# Patient Record
Sex: Female | Born: 1955
Health system: Southern US, Community
[De-identification: ages and names within clinical notes are randomized; demographics above are authoritative.]

## PROBLEM LIST (undated history)

## (undated) DIAGNOSIS — R0600 Dyspnea, unspecified: Secondary | ICD-10-CM

## (undated) DIAGNOSIS — F1910 Other psychoactive substance abuse, uncomplicated: Secondary | ICD-10-CM

## (undated) DIAGNOSIS — F329 Major depressive disorder, single episode, unspecified: Secondary | ICD-10-CM

## (undated) DIAGNOSIS — D219 Benign neoplasm of connective and other soft tissue, unspecified: Secondary | ICD-10-CM

## (undated) DIAGNOSIS — M1990 Unspecified osteoarthritis, unspecified site: Secondary | ICD-10-CM

## (undated) DIAGNOSIS — Z5189 Encounter for other specified aftercare: Secondary | ICD-10-CM

## (undated) DIAGNOSIS — J449 Chronic obstructive pulmonary disease, unspecified: Secondary | ICD-10-CM

## (undated) DIAGNOSIS — D509 Iron deficiency anemia, unspecified: Secondary | ICD-10-CM

## (undated) DIAGNOSIS — F32A Depression, unspecified: Secondary | ICD-10-CM

## (undated) DIAGNOSIS — F419 Anxiety disorder, unspecified: Secondary | ICD-10-CM

## (undated) DIAGNOSIS — D649 Anemia, unspecified: Secondary | ICD-10-CM

## (undated) DIAGNOSIS — R0602 Shortness of breath: Secondary | ICD-10-CM

## (undated) DIAGNOSIS — I10 Essential (primary) hypertension: Secondary | ICD-10-CM

## (undated) DIAGNOSIS — E785 Hyperlipidemia, unspecified: Secondary | ICD-10-CM

## (undated) DIAGNOSIS — M5430 Sciatica, unspecified side: Secondary | ICD-10-CM

## (undated) DIAGNOSIS — E739 Lactose intolerance, unspecified: Secondary | ICD-10-CM

## (undated) DIAGNOSIS — M8580 Other specified disorders of bone density and structure, unspecified site: Secondary | ICD-10-CM

## (undated) HISTORY — PX: BRAIN SURGERY: SHX531

## (undated) HISTORY — DX: Encounter for other specified aftercare: Z51.89

## (undated) HISTORY — DX: Hyperlipidemia, unspecified: E78.5

## (undated) HISTORY — DX: Chronic obstructive pulmonary disease, unspecified: J44.9

## (undated) HISTORY — DX: Shortness of breath: R06.02

## (undated) HISTORY — DX: Sciatica, unspecified side: M54.30

## (undated) HISTORY — DX: Other psychoactive substance abuse, uncomplicated: F19.10

## (undated) HISTORY — DX: Anemia, unspecified: D64.9

## (undated) HISTORY — DX: Dyspnea, unspecified: R06.00

## (undated) HISTORY — DX: Iron deficiency anemia, unspecified: D50.9

## (undated) HISTORY — DX: Unspecified osteoarthritis, unspecified site: M19.90

## (undated) HISTORY — DX: Anxiety disorder, unspecified: F41.9

## (undated) HISTORY — DX: Major depressive disorder, single episode, unspecified: F32.9

## (undated) HISTORY — DX: Lactose intolerance, unspecified: E73.9

## (undated) HISTORY — DX: Benign neoplasm of connective and other soft tissue, unspecified: D21.9

## (undated) HISTORY — DX: Depression, unspecified: F32.A

## (undated) HISTORY — DX: Other specified disorders of bone density and structure, unspecified site: M85.80

---

## 1993-04-22 HISTORY — PX: BREAST SURGERY: SHX581

## 1993-04-22 HISTORY — PX: ABDOMINAL HYSTERECTOMY: SHX81

## 1998-02-05 ENCOUNTER — Encounter: Payer: Self-pay | Admitting: Emergency Medicine

## 1998-02-05 ENCOUNTER — Emergency Department (HOSPITAL_COMMUNITY): Admission: EM | Admit: 1998-02-05 | Discharge: 1998-02-05 | Payer: Self-pay | Admitting: Emergency Medicine

## 1998-07-13 ENCOUNTER — Emergency Department (HOSPITAL_COMMUNITY): Admission: EM | Admit: 1998-07-13 | Discharge: 1998-07-13 | Payer: Self-pay | Admitting: Emergency Medicine

## 1998-09-16 ENCOUNTER — Emergency Department (HOSPITAL_COMMUNITY): Admission: EM | Admit: 1998-09-16 | Discharge: 1998-09-16 | Payer: Self-pay | Admitting: Emergency Medicine

## 1999-05-12 ENCOUNTER — Emergency Department (HOSPITAL_COMMUNITY): Admission: EM | Admit: 1999-05-12 | Discharge: 1999-05-12 | Payer: Self-pay | Admitting: Emergency Medicine

## 2012-01-20 ENCOUNTER — Encounter (HOSPITAL_COMMUNITY): Payer: Self-pay | Admitting: Emergency Medicine

## 2012-01-20 ENCOUNTER — Emergency Department (HOSPITAL_COMMUNITY): Payer: No Typology Code available for payment source

## 2012-01-20 ENCOUNTER — Emergency Department (HOSPITAL_COMMUNITY)
Admission: EM | Admit: 2012-01-20 | Discharge: 2012-01-20 | Disposition: A | Discharge status: Home / Self Care | Payer: No Typology Code available for payment source | Attending: Emergency Medicine | Admitting: Emergency Medicine

## 2012-01-20 DIAGNOSIS — S335XXA Sprain of ligaments of lumbar spine, initial encounter: Secondary | ICD-10-CM | POA: Insufficient documentation

## 2012-01-20 DIAGNOSIS — F17200 Nicotine dependence, unspecified, uncomplicated: Secondary | ICD-10-CM | POA: Insufficient documentation

## 2012-01-20 DIAGNOSIS — IMO0002 Reserved for concepts with insufficient information to code with codable children: Secondary | ICD-10-CM | POA: Insufficient documentation

## 2012-01-20 DIAGNOSIS — E669 Obesity, unspecified: Secondary | ICD-10-CM | POA: Insufficient documentation

## 2012-01-20 DIAGNOSIS — S139XXA Sprain of joints and ligaments of unspecified parts of neck, initial encounter: Secondary | ICD-10-CM | POA: Insufficient documentation

## 2012-01-20 DIAGNOSIS — Y92410 Unspecified street and highway as the place of occurrence of the external cause: Secondary | ICD-10-CM | POA: Insufficient documentation

## 2012-01-20 DIAGNOSIS — S2000XA Contusion of breast, unspecified breast, initial encounter: Secondary | ICD-10-CM | POA: Insufficient documentation

## 2012-01-20 DIAGNOSIS — Z23 Encounter for immunization: Secondary | ICD-10-CM | POA: Insufficient documentation

## 2012-01-20 MED ORDER — TETANUS-DIPHTH-ACELL PERTUSSIS 5-2.5-18.5 LF-MCG/0.5 IM SUSP
INTRAMUSCULAR | Status: AC
  Administered 2012-01-20: 0.5 mL via INTRAMUSCULAR
  Filled 2012-01-20: qty 0.5

## 2012-01-20 MED ORDER — BACITRACIN 500 UNIT/GM EX OINT
1.0000 "application " | TOPICAL_OINTMENT | Freq: Two times a day (BID) | CUTANEOUS | Status: DC
  Administered 2012-01-20: 1 via TOPICAL
  Filled 2012-01-20: qty 0.9

## 2012-01-20 MED ORDER — HYDROCODONE-ACETAMINOPHEN 5-325 MG PO TABS
2.0000 | ORAL_TABLET | Freq: Once | ORAL | Status: AC
  Administered 2012-01-20: 2 via ORAL

## 2012-01-20 MED ORDER — IBUPROFEN 800 MG PO TABS
800.0000 mg | ORAL_TABLET | Freq: Once | ORAL | Status: AC
  Administered 2012-01-20: 800 mg via ORAL
  Filled 2012-01-20: qty 1

## 2012-01-20 MED ORDER — HYDROCODONE-ACETAMINOPHEN 5-325 MG PO TABS: 2.0000 | ORAL_TABLET | ORAL | Status: DC | PRN

## 2012-01-20 MED ORDER — HYDROCODONE-ACETAMINOPHEN 5-325 MG PO TABS
ORAL_TABLET | ORAL | Status: AC
  Filled 2012-01-20: qty 2
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
## 2012-01-20 NOTE — ED Notes (Signed)
Left hand wounds dressed with bacitracin, telfa gauze and Kerlix wrap.
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
## 2012-01-23 ENCOUNTER — Encounter (HOSPITAL_COMMUNITY): Payer: Self-pay | Admitting: Emergency Medicine

## 2012-01-23 ENCOUNTER — Emergency Department (INDEPENDENT_AMBULATORY_CARE_PROVIDER_SITE_OTHER)
Admission: EM | Admit: 2012-01-23 | Discharge: 2012-01-23 | Disposition: A | Discharge status: Home / Self Care | Payer: Self-pay | Source: Home / Self Care | Attending: Emergency Medicine | Admitting: Emergency Medicine

## 2012-01-23 ENCOUNTER — Emergency Department (INDEPENDENT_AMBULATORY_CARE_PROVIDER_SITE_OTHER): Payer: Self-pay

## 2012-01-23 DIAGNOSIS — S2000XA Contusion of breast, unspecified breast, initial encounter: Secondary | ICD-10-CM

## 2012-01-23 DIAGNOSIS — S299XXA Unspecified injury of thorax, initial encounter: Secondary | ICD-10-CM

## 2012-01-23 DIAGNOSIS — S298XXA Other specified injuries of thorax, initial encounter: Secondary | ICD-10-CM

## 2012-01-23 LAB — POCT URINALYSIS DIP (DEVICE)
Ketones, ur: NEGATIVE mg/dL
Leukocytes, UA: NEGATIVE
Protein, ur: NEGATIVE mg/dL
Urobilinogen, UA: 0.2 mg/dL (ref 0.0–1.0)

## 2012-01-23 MED ORDER — OXYCODONE-ACETAMINOPHEN 5-325 MG PO TABS: ORAL_TABLET | ORAL | Status: DC
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
## 2012-02-19 ENCOUNTER — Emergency Department (HOSPITAL_COMMUNITY)
Admission: EM | Admit: 2012-02-19 | Discharge: 2012-02-19 | Disposition: A | Discharge status: Home / Self Care | Payer: Self-pay | Source: Home / Self Care

## 2012-02-19 NOTE — ED Notes (Signed)
Patient called but not present in waiting area x1

## 2012-02-19 NOTE — ED Notes (Signed)
No answer in lobby x 2.

## 2012-02-20 ENCOUNTER — Emergency Department (HOSPITAL_COMMUNITY)
Admission: EM | Admit: 2012-02-20 | Discharge: 2012-02-20 | Disposition: A | Discharge status: Home / Self Care | Payer: No Typology Code available for payment source | Attending: Emergency Medicine | Admitting: Emergency Medicine

## 2012-02-20 ENCOUNTER — Encounter (HOSPITAL_COMMUNITY): Payer: Self-pay

## 2012-02-20 DIAGNOSIS — I10 Essential (primary) hypertension: Secondary | ICD-10-CM | POA: Insufficient documentation

## 2012-02-20 DIAGNOSIS — R5383 Other fatigue: Secondary | ICD-10-CM | POA: Insufficient documentation

## 2012-02-20 DIAGNOSIS — R51 Headache: Secondary | ICD-10-CM

## 2012-02-20 DIAGNOSIS — F17200 Nicotine dependence, unspecified, uncomplicated: Secondary | ICD-10-CM | POA: Insufficient documentation

## 2012-02-20 DIAGNOSIS — R5381 Other malaise: Secondary | ICD-10-CM | POA: Insufficient documentation

## 2012-02-20 MED ORDER — PROMETHAZINE HCL 25 MG PO TABS: 25.0000 mg | ORAL_TABLET | Freq: Four times a day (QID) | ORAL | Status: DC | PRN

## 2012-02-20 MED ORDER — OXYCODONE-ACETAMINOPHEN 5-325 MG PO TABS: 2.0000 | ORAL_TABLET | ORAL | Status: DC | PRN

## 2012-02-20 MED ORDER — KETOROLAC TROMETHAMINE 30 MG/ML IJ SOLN
INTRAMUSCULAR | Status: AC
  Filled 2012-02-20: qty 2

## 2012-02-20 MED ORDER — KETOROLAC TROMETHAMINE 60 MG/2ML IM SOLN
60.0000 mg | Freq: Once | INTRAMUSCULAR | Status: AC
  Administered 2012-02-20: 60 mg via INTRAMUSCULAR

## 2012-02-20 NOTE — ED Provider Notes (Signed)
I saw and evaluated the patient, reviewed the resident's note and I agree with the findings and plan. Ha. No ams, neuro deficits or signs cns or systemic infxs.  sxs resolved in ed.  Cheri Guppy, MD 02/28/12 [phone]

## 2012-02-20 NOTE — ED Notes (Signed)
Patient presents with intermittent headache, dizziness, and hypertension x several weeks.  Patient denies chest pain and SOB.

## 2012-02-21 NOTE — ED Provider Notes (Signed)
History     CSN: [id number]  Arrival date & time 02/20/12  2049   First MD Initiated Contact with Patient 02/20/12 2125      Chief Complaint  Patient presents with  . Headache  . Hypertension    (Consider location/radiation/quality/duration/timing/severity/associated sxs/prior treatment) HPI Comments: Patient comes to the emergency department due to concerns of her headache. She has had it intermittently for 2 months, it has been worse for the past 2 weeks. It is a gradual onset headache that usually is frontal but at times also occipital. She has no other neurologic complaints. She is also concerned about her blood pressure. She has had it checked at her chiropractor several times and it has been up to 150s systolic at times. She does not have a diagnosis of hypertension. She is also under a lot of stress since moving in with her father to help take care of him 2 months ago.  Patient is a 56 y.o. female presenting with headaches and hypertension. The history is provided by the patient. No language interpreter was used.  Headache  This is a chronic problem. The current episode started more than 1 week ago. The problem occurs every few hours. The problem has not changed since onset.The headache is associated with nothing. The pain is located in the frontal and occipital region. The quality of the pain is described as dull. The pain is at a severity of 8/10. The pain is severe. The pain does not radiate. Associated symptoms include malaise/fatigue. Pertinent negatives include no anorexia, no fever, no chest pressure, no near-syncope, no orthopnea, no palpitations, no syncope, no shortness of breath, no nausea and no vomiting. She has tried oral narcotic analgesics for the symptoms. The treatment provided no relief.  Hypertension This is a new problem. The current episode started 1 to 4 weeks ago. The problem occurs every several days. Associated symptoms include headaches. Pertinent negatives  include no abdominal pain, anorexia, arthralgias, chest pain, chills, congestion, coughing, fever, nausea, neck pain, vomiting or weakness. Nothing aggravates the symptoms. She has tried nothing for the symptoms. The treatment provided no relief.    History reviewed. No pertinent past medical history.  Past Surgical History  Procedure Date  . Cesarean section   . Abdominal hysterectomy   . Brain surgery   . Breast surgery     No family history on file.  History  Substance Use Topics  . Smoking status: Current Every Day Smoker -- 0.5 packs/day    Types: Cigarettes  . Smokeless tobacco: Not on file  . Alcohol Use: No    OB History    Grav Para Term Preterm Abortions TAB SAB Ect Mult Living                  Review of Systems  Constitutional: Positive for malaise/fatigue. Negative for fever, chills, activity change and appetite change.  HENT: Negative for congestion, rhinorrhea, neck pain, neck stiffness and sinus pressure.   Eyes: Negative for discharge and visual disturbance.  Respiratory: Negative for cough, chest tightness, shortness of breath, wheezing and stridor.   Cardiovascular: Negative for chest pain, palpitations, orthopnea, leg swelling, syncope and near-syncope.  Gastrointestinal: Negative for nausea, vomiting, abdominal pain, diarrhea, abdominal distention and anorexia.  Genitourinary: Negative for decreased urine volume and difficulty urinating.  Musculoskeletal: Negative for back pain and arthralgias.  Skin: Negative for color change and pallor.  Neurological: Positive for headaches. Negative for weakness and light-headedness.  Psychiatric/Behavioral: Negative for behavioral problems and agitation.  All other systems reviewed and are negative.    Allergies  Review of patient's allergies indicates no known allergies.  Home Medications   Current Outpatient Rx  Name Route Sig Dispense Refill  . OXYCODONE-ACETAMINOPHEN 5-325 MG PO TABS Oral Take 1 tablet  by mouth every 4 (four) hours as needed. For pain    . OXYCODONE-ACETAMINOPHEN 5-325 MG PO TABS Oral Take 2 tablets by mouth every 4 (four) hours as needed for pain. 6 tablet 0  . PROMETHAZINE HCL 25 MG PO TABS Oral Take 1 tablet (25 mg total) by mouth every 6 (six) hours as needed for nausea. 12 tablet 0    BP 133/76  Pulse 92  Temp 97.8 F (36.6 C) (Oral)  Resp 18  SpO2 97%  Physical Exam  Nursing note and vitals reviewed. Constitutional: She is oriented to person, place, and time. She appears well-developed and well-nourished. No distress.  HENT:  Head: Normocephalic and atraumatic.  Mouth/Throat: No oropharyngeal exudate.  Eyes: EOM are normal. Pupils are equal, round, and reactive to light. Right eye exhibits no discharge. Left eye exhibits no discharge.  Neck: Normal range of motion. Neck supple. No JVD present.  Cardiovascular: Normal rate, regular rhythm and normal heart sounds.   Pulmonary/Chest: Effort normal and breath sounds normal. No stridor. No respiratory distress. She exhibits no tenderness.  Abdominal: Soft. Bowel sounds are normal. She exhibits no distension. There is no tenderness. There is no guarding.  Musculoskeletal: Normal range of motion. She exhibits no edema and no tenderness.  Neurological: She is alert and oriented to person, place, and time. She has normal reflexes. She displays normal reflexes. No cranial nerve deficit. She exhibits normal muscle tone. Coordination normal.       nml finger to nose. Neg romberg. nml gait.  Skin: Skin is warm and dry. No rash noted. She is not diaphoretic.  Psychiatric: She has a normal mood and affect. Her behavior is normal. Judgment and thought content normal.    ED Course  Procedures (including critical care time)  Labs Reviewed - No data to display No results found.   1. Headache       MDM  Clinical picture consistent with a likely tension headache. I believe stress and anxiety play a large part of this  due to her situation. She has no red flags for headache. It was not sudden onset, she has had no other neurological complaints such as vision change, paresthesias, weakness, vertigo. I consider but doubt subarachnoid hemorrhage, stroke, meningitis or encephalitis, increased intracranial pressure, dural sinus venous thrombosis. The patient was given Toradol which she responded well to. I am not concerned about her blood pressure. It has been mildly elevated on a few isolated occasions but is normal here. I instructed her on establishing primary care with a doctor for health maintenance and to manage her blood pressure if it does go up. I also counseled her on conservative measures to decrease blood pressure including diet modification, salt reduction, exercise. Pt deemed stable for discharge. Return precautions were provided and pt expressed understanding to return to ED if any acute symptoms return. Follow up was instructed which pt also expressed understanding. All questions were answered and pt was in agreement w/ plan.         Warrick Parisian, MD 02/21/12 0010

## 2012-02-21 NOTE — ED Provider Notes (Signed)
I saw and evaluated the patient, reviewed the resident's note and I agree with the findings and plan.  Karynn Deblasi, MD 02/21/12 0013 

## 2012-03-30 ENCOUNTER — Ambulatory Visit (HOSPITAL_COMMUNITY)
Admission: RE | Admit: 2012-03-30 | Discharge: 2012-03-30 | Disposition: A | Discharge status: Home / Self Care | Payer: No Typology Code available for payment source | Source: Ambulatory Visit | Attending: Chiropractic Medicine | Admitting: Chiropractic Medicine

## 2012-03-30 ENCOUNTER — Other Ambulatory Visit (HOSPITAL_COMMUNITY): Payer: Self-pay | Admitting: Chiropractic Medicine

## 2012-03-30 DIAGNOSIS — R52 Pain, unspecified: Secondary | ICD-10-CM

## 2012-03-30 DIAGNOSIS — M25519 Pain in unspecified shoulder: Secondary | ICD-10-CM | POA: Insufficient documentation

## 2012-03-30 DIAGNOSIS — R29898 Other symptoms and signs involving the musculoskeletal system: Secondary | ICD-10-CM | POA: Insufficient documentation

## 2012-03-30 DIAGNOSIS — M19019 Primary osteoarthritis, unspecified shoulder: Secondary | ICD-10-CM | POA: Insufficient documentation

## 2012-03-30 DIAGNOSIS — M719 Bursopathy, unspecified: Secondary | ICD-10-CM | POA: Insufficient documentation

## 2012-03-30 DIAGNOSIS — M67919 Unspecified disorder of synovium and tendon, unspecified shoulder: Secondary | ICD-10-CM | POA: Insufficient documentation

## 2012-04-23 ENCOUNTER — Encounter (HOSPITAL_COMMUNITY): Payer: Self-pay | Admitting: Emergency Medicine

## 2012-04-23 ENCOUNTER — Emergency Department (INDEPENDENT_AMBULATORY_CARE_PROVIDER_SITE_OTHER)
Admission: EM | Admit: 2012-04-23 | Discharge: 2012-04-23 | Disposition: A | Discharge status: Home / Self Care | Payer: Self-pay | Source: Home / Self Care | Attending: Family Medicine | Admitting: Family Medicine

## 2012-04-23 DIAGNOSIS — M545 Low back pain, unspecified: Secondary | ICD-10-CM

## 2012-04-23 DIAGNOSIS — R52 Pain, unspecified: Secondary | ICD-10-CM

## 2012-04-23 DIAGNOSIS — G8929 Other chronic pain: Secondary | ICD-10-CM

## 2012-04-23 HISTORY — DX: Essential (primary) hypertension: I10

## 2012-04-23 LAB — POCT URINALYSIS DIP (DEVICE)
Glucose, UA: NEGATIVE mg/dL
Nitrite: NEGATIVE
Urobilinogen, UA: 0.2 mg/dL (ref 0.0–1.0)

## 2012-04-23 MED ORDER — TRAMADOL HCL 50 MG PO TABS: 50.0000 mg | ORAL_TABLET | Freq: Four times a day (QID) | ORAL | Status: DC | PRN

## 2012-04-23 MED ORDER — NAPROXEN 500 MG PO TABS: 500.0000 mg | ORAL_TABLET | Freq: Two times a day (BID) | ORAL | Status: DC

## 2012-04-23 MED ORDER — CYCLOBENZAPRINE HCL 10 MG PO TABS: 10.0000 mg | ORAL_TABLET | Freq: Two times a day (BID) | ORAL | Status: DC | PRN

## 2012-04-23 MED ORDER — OXYCODONE-ACETAMINOPHEN 5-325 MG PO TABS: 2.0000 | ORAL_TABLET | Freq: Three times a day (TID) | ORAL | Status: DC | PRN

## 2012-04-23 NOTE — ED Provider Notes (Signed)
History     CSN: [id number]  Arrival date & time 04/23/12  1005   First MD Initiated Contact with Patient 04/23/12 1034      Chief Complaint  Patient presents with  . Back Pain    (Consider location/radiation/quality/duration/timing/severity/associated sxs/prior treatment) HPI Comments: 57 year old smoker female with history of degenerative disc disease and facet arthropathy of the lumbar spine here complaining of pain exacerbation of her chronic back pain. Reports pain is worse towards the right side in the low back with radiation to the right hip. Denies low extremity numbness, weakness or paresthesia. Denies urinary or stool incontinence. Denies abdominal pain, pelvic pain dysuria or hematuria. Denies fever or chills.   Past Medical History  Diagnosis Date  . Hypertension     Past Surgical History  Procedure Date  . Cesarean section   . Abdominal hysterectomy   . Brain surgery   . Breast surgery     No family history on file.  History  Substance Use Topics  . Smoking status: Current Every Day Smoker -- 0.5 packs/day    Types: Cigarettes  . Smokeless tobacco: Not on file  . Alcohol Use: No    OB History    Grav Para Term Preterm Abortions TAB SAB Ect Mult Living                  Review of Systems  Constitutional: Negative for fever and chills.  Gastrointestinal: Negative for nausea, vomiting, abdominal pain and diarrhea.  Genitourinary: Negative for dysuria, frequency, hematuria, flank pain and pelvic pain.  Musculoskeletal: Positive for back pain.  Skin: Negative for rash.  Neurological: Negative for dizziness, weakness, numbness and headaches.    Allergies  Review of patient's allergies indicates no known allergies.  Home Medications   Current Outpatient Rx  Name  Route  Sig  Dispense  Refill  . CYCLOBENZAPRINE HCL 10 MG PO TABS   Oral   Take 1 tablet (10 mg total) by mouth 2 (two) times daily as needed for muscle spasms.   20 tablet   0   .  NAPROXEN 500 MG PO TABS   Oral   Take 1 tablet (500 mg total) by mouth 2 (two) times daily with a meal.   20 tablet   0   . OXYCODONE-ACETAMINOPHEN 5-325 MG PO TABS   Oral   Take 2 tablets by mouth every 8 (eight) hours as needed for pain.   6 tablet   0   . PROMETHAZINE HCL 25 MG PO TABS   Oral   Take 1 tablet (25 mg total) by mouth every 6 (six) hours as needed for nausea.   12 tablet   0   . TRAMADOL HCL 50 MG PO TABS   Oral   Take 1 tablet (50 mg total) by mouth every 6 (six) hours as needed for pain.   30 tablet   0     BP 146/94  Pulse 68  Temp 98.7 F (37.1 C) (Oral)  Resp 18  SpO2 100%  Physical Exam  Nursing note and vitals reviewed. Constitutional: She is oriented to person, place, and time. She appears well-nourished. No distress.       obese  HENT:  Head: Normocephalic and atraumatic.  Neck: Neck supple.  Cardiovascular: Normal heart sounds.   Pulmonary/Chest: Breath sounds normal.  Abdominal: Soft. There is no tenderness.  Musculoskeletal:       Central spine with impress mild scoliosis towards left side, no kyphosis. limited anterior  flexion and posterior extension. Patient able to walk on tip toes and heels with no difficulty, foot drop or pain exacerbation. No bone prominence tenderness. Tenderness to palpation in bilateral lumbar paravertebral muscles, worse on right side.  Negative straight leg test bilateral. Intact sensation and symmetric + DTRs (rotullian and achillean) in low extremities.    Neurological: She is alert and oriented to person, place, and time.  Skin: No rash noted.    ED Course  Procedures (including critical care time)   Labs Reviewed  POCT URINALYSIS DIP (DEVICE)   No results found.   1. Acute exacerbation of chronic low back pain       MDM  Normal point-of-care urinalysis.  Patient was treated with Flexeril, naproxen and tramadol. I refilled Percocet #6 pills one time as well. Patient will keep a blood  pressure monitor log of her blood pressure was slightly elevated to 146/94 today. No prior history of hypertension. Supportive care and if you should prompt her return to medical attention discussed with patient and provided in writing.        Sharin Grave, MD 04/25/12 [phone]

## 2012-04-23 NOTE — ED Notes (Signed)
Pt c/o lower right side back pain x1 day. Pain increases w/activity or when she's sitting down and applying pressure to back. Denies: inj/trauma, hematuria, fevers, vomiting, nauseas, diarrhea. Pain is gradually getting worse She is alert w/no signs of acute distress.

## 2012-05-18 ENCOUNTER — Emergency Department (INDEPENDENT_AMBULATORY_CARE_PROVIDER_SITE_OTHER)
Admission: EM | Admit: 2012-05-18 | Discharge: 2012-05-18 | Disposition: A | Discharge status: Home / Self Care | Payer: No Typology Code available for payment source | Source: Home / Self Care | Attending: Family Medicine | Admitting: Family Medicine

## 2012-05-18 ENCOUNTER — Encounter (HOSPITAL_COMMUNITY): Payer: Self-pay

## 2012-05-18 DIAGNOSIS — F17200 Nicotine dependence, unspecified, uncomplicated: Secondary | ICD-10-CM

## 2012-05-18 DIAGNOSIS — I10 Essential (primary) hypertension: Secondary | ICD-10-CM

## 2012-05-18 LAB — CBC
MCH: 26.7 pg (ref 26.0–34.0)
Platelets: 368 10*3/uL (ref 150–400)
RBC: 5.55 MIL/uL — ABNORMAL HIGH (ref 3.87–5.11)
WBC: 11.1 10*3/uL — ABNORMAL HIGH (ref 4.0–10.5)

## 2012-05-18 LAB — COMPREHENSIVE METABOLIC PANEL
ALT: 24 U/L (ref 0–35)
Albumin: 3.9 g/dL (ref 3.5–5.2)
Alkaline Phosphatase: 103 U/L (ref 39–117)
Potassium: 3.6 mEq/L (ref 3.5–5.1)
Sodium: 139 mEq/L (ref 135–145)
Total Protein: 7.8 g/dL (ref 6.0–8.3)

## 2012-05-18 LAB — LIPID PANEL
Cholesterol: 222 mg/dL — ABNORMAL HIGH (ref 0–200)
Total CHOL/HDL Ratio: 6.2 RATIO
VLDL: 31 mg/dL (ref 0–40)

## 2012-05-18 LAB — TSH: TSH: 0.359 u[IU]/mL (ref 0.350–4.500)

## 2012-05-18 MED ORDER — LISINOPRIL-HYDROCHLOROTHIAZIDE 10-12.5 MG PO TABS: 1.0000 | ORAL_TABLET | Freq: Every day | ORAL | Status: DC

## 2012-05-18 NOTE — ED Provider Notes (Signed)
History     CSN: [id number]  Arrival date & time 05/18/12  1559   First MD Initiated Contact with Patient 05/18/12 1658      Chief Complaint  Patient presents with  . Headache   HPI The patient presented to the clinic today because she's been concerned about her elevated blood pressures.  She's had several elevated blood pressures in the past several weeks.  She's having headaches and she's feeling fatigued.  She's had no chest pain or shortness of breath.  She would like to be evaluated for hypertension and treated if needed.  Past Medical History  Diagnosis Date  . Hypertension     Past Surgical History  Procedure Date  . Cesarean section   . Abdominal hysterectomy   . Brain surgery   . Breast surgery     No family history on file.  History  Substance Use Topics  . Smoking status: Current Every Day Smoker -- 0.5 packs/day    Types: Cigarettes  . Smokeless tobacco: Not on file  . Alcohol Use: No    OB History    Grav Para Term Preterm Abortions TAB SAB Ect Mult Living                  Review of Systems  HENT: Positive for congestion, rhinorrhea and postnasal drip.   Neurological: Positive for light-headedness and headaches.  All other systems reviewed and are negative.    Allergies  Review of patient's allergies indicates no known allergies.  Home Medications   Current Outpatient Rx  Name  Route  Sig  Dispense  Refill  . CYCLOBENZAPRINE HCL 10 MG PO TABS   Oral   Take 1 tablet (10 mg total) by mouth 2 (two) times daily as needed for muscle spasms.   20 tablet   0   . NAPROXEN 500 MG PO TABS   Oral   Take 1 tablet (500 mg total) by mouth 2 (two) times daily with a meal.   20 tablet   0   . OXYCODONE-ACETAMINOPHEN 5-325 MG PO TABS   Oral   Take 2 tablets by mouth every 8 (eight) hours as needed for pain.   6 tablet   0   . PROMETHAZINE HCL 25 MG PO TABS   Oral   Take 1 tablet (25 mg total) by mouth every 6 (six) hours as needed for  nausea.   12 tablet   0   . TRAMADOL HCL 50 MG PO TABS   Oral   Take 1 tablet (50 mg total) by mouth every 6 (six) hours as needed for pain.   30 tablet   0     BP 147/94  Pulse 73  Temp 98 F (36.7 C) (Oral)  Resp 16  SpO2 100%  Physical Exam  Nursing note and vitals reviewed. Constitutional: She is oriented to person, place, and time. She appears well-developed and well-nourished. No distress.  HENT:  Head: Normocephalic and atraumatic.  Eyes: Conjunctivae normal and EOM are normal. Pupils are equal, round, and reactive to light. No scleral icterus.  Neck: Normal range of motion. Neck supple. No JVD present. No tracheal deviation present. No thyromegaly present.  Cardiovascular: Normal rate, regular rhythm and normal heart sounds.   Pulmonary/Chest: Effort normal and breath sounds normal. No respiratory distress. She has no wheezes. She has no rales.  Abdominal: Soft. Bowel sounds are normal.  Musculoskeletal: Normal range of motion. She exhibits no edema and no tenderness.  Lymphadenopathy:  She has no cervical adenopathy.  Neurological: She is alert and oriented to person, place, and time.  Skin: Skin is warm and dry.  Psychiatric: She has a normal mood and affect. Her behavior is normal. Judgment and thought content normal.    ED Course  Procedures (including critical care time)  Labs Reviewed - No data to display No results found.  No diagnosis found.  MDM  IMPRESSION  Hypertension  Tobacco Abuse  RECOMMENDATIONS / PLAN Check labs today Start lisinopril HCTZ 10/12.5 take 1 po daily  Followup lab results Patient declined flu vaccine  FOLLOW UP 2 weeks for BP check with the nurse One month for medical followup visit for hypertension  The patient was given clear instructions to go to ER or return to medical center if symptoms don't improve, worsen or new problems develop.  The patient verbalized understanding.  The patient was told to call to get lab  results if they haven't heard anything in the next week.            Cleora Fleet, MD 05/18/12 [phone]

## 2012-05-18 NOTE — ED Notes (Signed)
Complain of feeling dizzy, balance feels off for almost a week, frequent urination

## 2012-05-19 NOTE — Progress Notes (Signed)
Quick Note:  Please notify patient that her cholesterol came back mildly elevated. Recommend low-fat low-cholesterol diet and exercise 5 times per week. Recheck cholesterol in 4 months. Other labs came back OK. Recheck CBC, CMP, Lipid Panel in 3-4 months.  Rodney Langton, MD, CDE, FAAFP [facility] [place], [place]   ______

## 2012-05-20 ENCOUNTER — Telehealth (HOSPITAL_COMMUNITY): Payer: Self-pay

## 2012-05-20 NOTE — Telephone Encounter (Signed)
Message copied by Lestine Mount on Wed May 20, 2012 10:14 AM ------      Message from: Cleora Fleet      Created: Tue May 19, 2012 10:44 AM       Please notify patient that her cholesterol came back mildly elevated.  Recommend low-fat low-cholesterol diet and exercise 5 times per week.  Recheck cholesterol in 4 months. Other labs came back OK.  Recheck CBC, CMP, Lipid Panel in 3-4 months.            Rodney Langton, MD, CDE, FAAFP      [facility]      [facility]      [place], [place]

## 2012-06-01 ENCOUNTER — Other Ambulatory Visit: Payer: Self-pay | Admitting: Infectious Diseases

## 2012-06-01 ENCOUNTER — Emergency Department (HOSPITAL_COMMUNITY)
Admission: EM | Admit: 2012-06-01 | Discharge: 2012-06-01 | Disposition: A | Discharge status: Home / Self Care | Payer: No Typology Code available for payment source | Source: Home / Self Care

## 2012-06-01 ENCOUNTER — Ambulatory Visit
Admission: RE | Admit: 2012-06-01 | Discharge: 2012-06-01 | Disposition: A | Discharge status: Home / Self Care | Payer: No Typology Code available for payment source | Source: Ambulatory Visit | Attending: Infectious Diseases | Admitting: Infectious Diseases

## 2012-06-01 DIAGNOSIS — R7611 Nonspecific reaction to tuberculin skin test without active tuberculosis: Secondary | ICD-10-CM

## 2012-06-01 NOTE — ED Notes (Signed)
Patient here for blood pressure check

## 2012-09-01 ENCOUNTER — Encounter (HOSPITAL_COMMUNITY): Payer: Self-pay | Admitting: Emergency Medicine

## 2012-09-01 ENCOUNTER — Emergency Department (HOSPITAL_COMMUNITY)
Admission: EM | Admit: 2012-09-01 | Discharge: 2012-09-01 | Disposition: A | Discharge status: Home / Self Care | Payer: No Typology Code available for payment source | Attending: Emergency Medicine | Admitting: Emergency Medicine

## 2012-09-01 DIAGNOSIS — Z79899 Other long term (current) drug therapy: Secondary | ICD-10-CM | POA: Insufficient documentation

## 2012-09-01 DIAGNOSIS — Z7289 Other problems related to lifestyle: Secondary | ICD-10-CM | POA: Insufficient documentation

## 2012-09-01 DIAGNOSIS — Z72821 Inadequate sleep hygiene: Secondary | ICD-10-CM

## 2012-09-01 DIAGNOSIS — G478 Other sleep disorders: Secondary | ICD-10-CM

## 2012-09-01 DIAGNOSIS — F17200 Nicotine dependence, unspecified, uncomplicated: Secondary | ICD-10-CM | POA: Insufficient documentation

## 2012-09-01 DIAGNOSIS — H9209 Otalgia, unspecified ear: Secondary | ICD-10-CM | POA: Insufficient documentation

## 2012-09-01 DIAGNOSIS — R5383 Other fatigue: Secondary | ICD-10-CM | POA: Insufficient documentation

## 2012-09-01 DIAGNOSIS — Z72820 Sleep deprivation: Secondary | ICD-10-CM | POA: Insufficient documentation

## 2012-09-01 DIAGNOSIS — R5381 Other malaise: Secondary | ICD-10-CM | POA: Insufficient documentation

## 2012-09-01 DIAGNOSIS — I10 Essential (primary) hypertension: Secondary | ICD-10-CM | POA: Insufficient documentation

## 2012-09-01 LAB — BASIC METABOLIC PANEL
GFR calc Af Amer: >90 mL/min (ref >90)
GFR calc non Af Amer: >90 mL/min (ref >90)
Glucose, Bld: 92 mg/dL (ref 70–99)
Potassium: 3.6 mEq/L (ref 3.5–5.1)
Sodium: 141 mEq/L (ref 135–145)

## 2012-09-01 LAB — POCT I-STAT, CHEM 8
Calcium, Ion: 1.24 mmol/L — ABNORMAL HIGH (ref 1.12–1.23)
Glucose, Bld: 93 mg/dL (ref 70–99)
HCT: 42 % (ref 36.0–46.0)
Hemoglobin: 14.3 g/dL (ref 12.0–15.0)
Potassium: 3.5 mEq/L (ref 3.5–5.1)

## 2012-09-01 LAB — CBC
Hemoglobin: 13.1 g/dL (ref 12.0–15.0)
MCHC: 34.2 g/dL (ref 30.0–36.0)
Platelets: 358 10*3/uL (ref 150–400)

## 2012-09-01 NOTE — ED Notes (Signed)
Per the resident, Elizabeth Best, pt's CBG will result with the i-stat that resident ordered on the pt so "don't worry about doing a POCT CBG". RN notified. Resident also said it was okay for the pt to eat, pt given a happy meal.

## 2012-09-01 NOTE — ED Provider Notes (Signed)
History     CSN: [id number]  Arrival date & time 09/01/12  4098   First MD Initiated Contact with Patient 09/01/12 [phone]      Chief Complaint  Patient presents with  . Dizziness  . Fatigue  . Otalgia    (Consider location/radiation/quality/duration/timing/severity/associated sxs/prior treatment) HPI Comments: 57 y.o. female who presents to the Er w/ the cc of fatigue. She states that her symptoms started when she started "3rd shift". States that over the past month she has been alternating between caffeine pills to stay awake at work, and then sleeping pills when she gets home from work. States due to this, she is only sleeping a few hours a day, and she is frequently napping throughout the day. Overall, this has made her tired. She states she sometimes gets "dizzy" when she has not slept for extended periods of time. No syncope or presyncope with this. She attributes her symptoms to lack of sleep -- but "wanted to make sure nothing else going on".   Patient is a 57 y.o. female presenting with general illness. The history is provided by the patient.  Illness  The current episode started more than 2 weeks ago. The onset was gradual. The problem occurs frequently. The problem has been unchanged. The problem is mild. Nothing relieves the symptoms. Exacerbated by: not sleeping. Pertinent negatives include no fever, no eye itching, no abdominal pain, no diarrhea, no nausea, no vomiting, no headaches, no stridor, no neck pain, no cough, no wheezing, no rash, no eye discharge and no eye pain.    Past Medical History  Diagnosis Date  . Hypertension     Past Surgical History  Procedure Laterality Date  . Cesarean section    . Abdominal hysterectomy    . Brain surgery    . Breast surgery      No family history on file.  History  Substance Use Topics  . Smoking status: Current Every Day Smoker -- 0.50 packs/day    Types: Cigarettes  . Smokeless tobacco: Not on file  . Alcohol Use: No     OB History   Grav Para Term Preterm Abortions TAB SAB Ect Mult Living                  Review of Systems  Constitutional: Positive for activity change and fatigue. Negative for fever and chills.  HENT: Negative for facial swelling, drooling, neck pain and dental problem.   Eyes: Negative for pain, discharge and itching.  Respiratory: Negative for cough, choking, wheezing and stridor.   Cardiovascular: Negative for chest pain.  Gastrointestinal: Negative for nausea, vomiting, abdominal pain and diarrhea.  Endocrine: Negative for cold intolerance and heat intolerance.  Genitourinary: Negative for vaginal discharge, difficulty urinating and vaginal pain.  Skin: Negative for pallor and rash.  Neurological: Positive for weakness. Negative for dizziness, light-headedness and headaches.  Psychiatric/Behavioral: Negative for behavioral problems and agitation.    Allergies  Review of patient's allergies indicates no known allergies.  Home Medications   Current Outpatient Rx  Name  Route  Sig  Dispense  Refill  . cyclobenzaprine (FLEXERIL) 10 MG tablet   Oral   Take 1 tablet (10 mg total) by mouth 2 (two) times daily as needed for muscle spasms.   20 tablet   0   . lisinopril-hydrochlorothiazide (PRINZIDE,ZESTORETIC) 10-12.5 MG per tablet   Oral   Take 1 tablet by mouth daily.   30 tablet   3   . naproxen (NAPROSYN) 500 MG  tablet   Oral   Take 1 tablet (500 mg total) by mouth 2 (two) times daily with a meal.   20 tablet   0   . oxyCODONE-acetaminophen (PERCOCET/ROXICET) 5-325 MG per tablet   Oral   Take 2 tablets by mouth every 8 (eight) hours as needed for pain.   6 tablet   0   . promethazine (PHENERGAN) 25 MG tablet   Oral   Take 1 tablet (25 mg total) by mouth every 6 (six) hours as needed for nausea.   12 tablet   0   . traMADol (ULTRAM) 50 MG tablet   Oral   Take 1 tablet (50 mg total) by mouth every 6 (six) hours as needed for pain.   30 tablet   0      BP 119/71  Pulse 72  Temp(Src) 98.3 F (36.8 C) (Oral)  Resp 16  Ht 5\' 3"  (1.6 m)  Wt 190 lb (86.183 kg)  BMI 33.67 kg/m2  SpO2 98%  Physical Exam  Constitutional: She is oriented to person, place, and time. She appears well-developed. No distress.  HENT:  Head: Normocephalic and atraumatic.  TMs look normal   Eyes: Pupils are equal, round, and reactive to light. Right eye exhibits no discharge. Left eye exhibits no discharge.  Neck: Neck supple. No tracheal deviation present.  Cardiovascular: Normal rate.  Exam reveals no gallop and no friction rub.   Pulmonary/Chest: No stridor. No respiratory distress. She has no wheezes.  Abdominal: Soft. She exhibits no distension. There is no tenderness. There is no rebound.  Musculoskeletal: She exhibits no edema and no tenderness.  Neurological: She is alert and oriented to person, place, and time. She displays normal reflexes. No cranial nerve deficit. Coordination normal.  Pt is able to ambulate in the Er without issues, normal gait. Normal heel to toe. UE / LE 5/5 strength. Romberg negative. dixhalpike test is negative.   Skin: Skin is warm. She is not diaphoretic.    ED Course  Procedures (including critical care time)  Labs Reviewed  CBC - Abnormal; Notable for the following:    WBC 12.0 (*)    MCV 75.8 (*)    MCH 25.9 (*)    RDW 15.6 (*)    All other components within normal limits  POCT I-STAT, CHEM 8 - Abnormal; Notable for the following:    Calcium, Ion 1.24 (*)    All other components within normal limits  BASIC METABOLIC PANEL  POCT I-STAT TROPONIN I   No results found.   MDM   Pt's initial ECG was concerning for possible ST elevation on aVL -- however, immediate repeat ECG shows NSR, no T wave inversions, HR 69, no pathologic ST wave changes.   Pt is not having chest pain or chest pressure. She does not have hx of HTN/diabetes/significant family history. She does smoke. Doubt ACS as etiology of her symptoms.    Will do baseline labs -- but suspect her etiology of symptoms secondary to poor sleep hygiene. Pt is daily taking over the counter sleep medicine and daily using caffeine pills -- have told patient this is not a good thing to do -- and can cause the weakness and fatigue she is experiencing. Pt states she does not want a chest xray done.   Pt's labs negative for acute pathology -- mild leukocytosis -- not tachy, not febrile -- do not suspect sepsis or acute infection.   Pt is educated on sleep hygiene -- have  told to stop self prescribing caffeine pills and over the counter sleep meds. told to f/u with pcp as further evaluation is necessary -- have given pt resources for follow up care.   1. Poor sleep hygiene   2. Poor sleep pattern            Bernadene Person, MD 09/01/12 1505

## 2012-09-01 NOTE — ED Notes (Signed)
Onset 2 weeks ago intermittent left ear pain. 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 States having "dizzy spells" and now occuring more frequent. Alert answering and following commands appropriate.

## 2012-09-01 NOTE — ED Notes (Signed)
All POCT results given to MD.

## 2012-09-01 NOTE — ED Notes (Signed)
EKG shown to Doctor said to bring patient to room immediately and repeat EKG.

## 2012-09-04 NOTE — ED Provider Notes (Signed)
I have supervised the resident on the management of this patient and agree with the note above. I personally interviewed and examined the patient and my addendum is below.   Elizabeth Best is a 57 y.o. female here with fatigue. 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 She sometimes gets dizzy as well. Denies chest pain or SOB. First EKG showed ? STEMI in AVL but she is asymptomatic. Repeat EKG showed no STEMI so the first one is likely from lead placement. Her trop neg x 1 and low risk for ACS and symptoms for more than 24 hrs. Labs unremarkable. We counseled patient to sleep better and have better sleep hygiene.    Richardean Canal, MD 09/04/12 [phone]

## 2012-09-22 ENCOUNTER — Other Ambulatory Visit: Payer: Self-pay

## 2012-09-22 DIAGNOSIS — Z1231 Encounter for screening mammogram for malignant neoplasm of breast: Secondary | ICD-10-CM

## 2012-09-24 ENCOUNTER — Ambulatory Visit
Admission: RE | Admit: 2012-09-24 | Discharge: 2012-09-24 | Disposition: A | Discharge status: Home / Self Care | Payer: No Typology Code available for payment source | Source: Ambulatory Visit

## 2012-09-24 ENCOUNTER — Ambulatory Visit
Admission: RE | Admit: 2012-09-24 | Discharge: 2012-09-24 | Disposition: A | Discharge status: Home / Self Care | Payer: No Typology Code available for payment source | Source: Ambulatory Visit | Attending: Internal Medicine | Admitting: Internal Medicine

## 2012-09-24 ENCOUNTER — Other Ambulatory Visit: Payer: Self-pay | Admitting: Internal Medicine

## 2012-09-24 DIAGNOSIS — Z1231 Encounter for screening mammogram for malignant neoplasm of breast: Secondary | ICD-10-CM

## 2012-09-24 DIAGNOSIS — N6310 Unspecified lump in the right breast, unspecified quadrant: Secondary | ICD-10-CM

## 2012-09-24 DIAGNOSIS — N63 Unspecified lump in unspecified breast: Secondary | ICD-10-CM

## 2012-10-30 ENCOUNTER — Ambulatory Visit: Payer: Self-pay | Admitting: Internal Medicine

## 2012-12-14 ENCOUNTER — Other Ambulatory Visit: Payer: Self-pay | Admitting: Internal Medicine

## 2012-12-14 DIAGNOSIS — M7989 Other specified soft tissue disorders: Secondary | ICD-10-CM

## 2013-02-01 ENCOUNTER — Other Ambulatory Visit: Payer: Self-pay | Admitting: Internal Medicine

## 2013-02-01 ENCOUNTER — Ambulatory Visit
Admission: RE | Admit: 2013-02-01 | Discharge: 2013-02-01 | Disposition: A | Discharge status: Home / Self Care | Payer: No Typology Code available for payment source | Source: Ambulatory Visit | Attending: Internal Medicine | Admitting: Internal Medicine

## 2013-02-01 DIAGNOSIS — M7989 Other specified soft tissue disorders: Secondary | ICD-10-CM

## 2013-02-08 ENCOUNTER — Emergency Department (HOSPITAL_COMMUNITY)
Admission: EM | Admit: 2013-02-08 | Discharge: 2013-02-09 | Disposition: A | Discharge status: Home / Self Care | Payer: No Typology Code available for payment source | Attending: Emergency Medicine | Admitting: Emergency Medicine

## 2013-02-08 DIAGNOSIS — H9190 Unspecified hearing loss, unspecified ear: Secondary | ICD-10-CM | POA: Insufficient documentation

## 2013-02-08 DIAGNOSIS — F17200 Nicotine dependence, unspecified, uncomplicated: Secondary | ICD-10-CM | POA: Insufficient documentation

## 2013-02-08 DIAGNOSIS — H6092 Unspecified otitis externa, left ear: Secondary | ICD-10-CM

## 2013-02-08 DIAGNOSIS — H60399 Other infective otitis externa, unspecified ear: Secondary | ICD-10-CM | POA: Insufficient documentation

## 2013-02-08 DIAGNOSIS — R599 Enlarged lymph nodes, unspecified: Secondary | ICD-10-CM | POA: Insufficient documentation

## 2013-02-08 DIAGNOSIS — I10 Essential (primary) hypertension: Secondary | ICD-10-CM | POA: Insufficient documentation

## 2013-02-08 NOTE — ED Notes (Signed)
Pt. reports left ear ache for 3 days with headache denies injury / no drainage .

## 2013-02-09 ENCOUNTER — Encounter (HOSPITAL_COMMUNITY): Payer: Self-pay | Admitting: Emergency Medicine

## 2013-02-09 MED ORDER — NEOMYCIN-COLIST-HC-THONZONIUM 3.3-3-10-0.5 MG/ML OT SUSP: 3.0000 [drp] | Freq: Once | OTIC | Status: DC

## 2013-02-09 MED ORDER — NEOMYCIN-POLYMYXIN-HC 1 % OT SOLN
3.0000 [drp] | Freq: Once | OTIC | Status: AC
  Administered 2013-02-09: 3 [drp] via OTIC
  Filled 2013-02-09: qty 10

## 2013-02-09 NOTE — ED Provider Notes (Signed)
Medical screening examination/treatment/procedure(s) were performed by non-physician practitioner and as supervising physician I was immediately available for consultation/collaboration.   Teondra Newburg, MD 02/09/13 0650 

## 2013-02-09 NOTE — ED Provider Notes (Signed)
CSN: [id number]     Arrival date & time 02/08/13  2330 History   First MD Initiated Contact with Patient 02/09/13 0025     Chief Complaint  Patient presents with  . Otalgia   (Consider location/radiation/quality/duration/timing/severity/associated sxs/prior Treatment) HPI Comments: Patient has had gradual onset of left ear pain.  Over a three-day period is tender to touch.  Denies any discharge, recent URI symptoms, history of ear infections.  Denies swimming or water getting in her ear  Patient is a 57 y.o. female presenting with ear pain.  Otalgia Location:  Left Behind ear:  Swelling Quality:  Aching Severity:  Severe Onset quality:  Gradual Duration:  3 days Timing:  Constant Progression:  Worsening Chronicity:  New Relieved by:  None tried Worsened by:  Position and palpation Ineffective treatments:  None tried Associated symptoms: hearing loss   Associated symptoms: no ear discharge, no fever, no headaches, no rhinorrhea, no sore throat and no tinnitus   Risk factors: no chronic ear infection and no prior ear surgery     Past Medical History  Diagnosis Date  . Hypertension    Past Surgical History  Procedure Laterality Date  . Cesarean section    . Abdominal hysterectomy    . Brain surgery    . Breast surgery     No family history on file. History  Substance Use Topics  . Smoking status: Current Every Day Smoker -- 0.50 packs/day    Types: Cigarettes  . Smokeless tobacco: Not on file  . Alcohol Use: No   OB History   Grav Para Term Preterm Abortions TAB SAB Ect Mult Living                 Review of Systems  Constitutional: Negative for fever and chills.  HENT: Positive for ear pain and hearing loss. Negative for ear discharge, facial swelling, rhinorrhea, sinus pressure, sore throat, tinnitus and trouble swallowing.   Neurological: Negative for dizziness and headaches.  All other systems reviewed and are negative.    Allergies  Review of patient's  allergies indicates no known allergies.  Home Medications  No current outpatient prescriptions on file. BP 137/82  Pulse 75  Temp(Src) 98 F (36.7 C) (Oral)  Resp 16  SpO2 100% Physical Exam  Nursing note and vitals reviewed. Constitutional: She is oriented to person, place, and time. She appears well-developed and well-nourished.  HENT:  Head: Normocephalic.  Left Ear: There is swelling and tenderness. No drainage. No mastoid tenderness. Decreased hearing is noted.  Mouth/Throat: Oropharynx is clear and moist.  Left external canal swollen, tender, no drainage.  Mastoid not tender  Cardiovascular: Normal rate.   Musculoskeletal: Normal range of motion.  Lymphadenopathy:    She has cervical adenopathy.  Neurological: She is alert and oriented to person, place, and time.  Skin: Skin is warm.    ED Course  Procedures (including critical care time) Labs Review Labs Reviewed - No data to display Imaging Review No results found.  EKG Interpretation   None       MDM   1. Otitis externa, left    patient has been given Cortisporin otic drops to use with instructions for proper use and frequency.  She's been referred to Dr. Malissa Hippo, ENT, if not getting, better in the next 3-5 days    Arman Filter, NP 02/09/13 0105

## 2013-04-09 ENCOUNTER — Emergency Department (HOSPITAL_COMMUNITY)
Admission: EM | Admit: 2013-04-09 | Discharge: 2013-04-09 | Disposition: A | Discharge status: Home / Self Care | Payer: No Typology Code available for payment source | Source: Home / Self Care | Attending: Family Medicine | Admitting: Family Medicine

## 2013-04-09 ENCOUNTER — Encounter (HOSPITAL_COMMUNITY): Payer: Self-pay | Admitting: Emergency Medicine

## 2013-04-09 DIAGNOSIS — J111 Influenza due to unidentified influenza virus with other respiratory manifestations: Secondary | ICD-10-CM

## 2013-04-09 DIAGNOSIS — H6092 Unspecified otitis externa, left ear: Secondary | ICD-10-CM

## 2013-04-09 DIAGNOSIS — H60399 Other infective otitis externa, unspecified ear: Secondary | ICD-10-CM

## 2013-04-09 MED ORDER — CIPROFLOXACIN-DEXAMETHASONE 0.3-0.1 % OT SUSP: 4.0000 [drp] | Freq: Two times a day (BID) | OTIC | Status: AC

## 2013-04-09 MED ORDER — ANTIPYRINE-BENZOCAINE 5.4-1.4 % OT SOLN: 3.0000 [drp] | OTIC | Status: DC | PRN

## 2013-04-09 MED ORDER — ONDANSETRON 8 MG PO TBDP: 4.0000 mg | ORAL_TABLET | Freq: Three times a day (TID) | ORAL | Status: DC | PRN

## 2013-04-09 NOTE — ED Notes (Signed)
Pt  Has  Cough  Dizzy    Nauseated       Diarrhea    No  Vomiting        sorethroat  Symptoms  X  sev  Days  lightheadeed

## 2013-04-09 NOTE — ED Provider Notes (Signed)
Elizabeth Best is a 57 y.o. female who presents to Urgent Care today for abdominal pain and cough. She reports 1 day of abdominal pain, aching, coughing, subjective fevers and chills, and yesterday mild vomiting. She has also had decreased PO and diarrhea. She denies rash, sneezing, chest pain, or dyspnea. She reports that her face feels a little swollen and is itchy. She tried coco butter which may have made it worse. Denies dyspnea or swollen tongue. 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   Denies new medications, new foods, and new lotions. Has recently started using a new cheaper detergent but denies rash in other locations or mucus membranes.   Also, she reports left earache similar to previuos ear ache when she was diagnosed with otitis externa a few months ago.  Past Medical History  Diagnosis Date  . Hypertension    History  Substance Use Topics  . Smoking status: Current Every Day Smoker -- 0.50 packs/day    Types: Cigarettes  . Smokeless tobacco: Not on file  . Alcohol Use: No   ROS as above Medications reviewed. No current facility-administered medications for this encounter.   No current outpatient prescriptions on file.  No medications regularly NKDA  Exam:  BP 145/87  Pulse 82  Temp(Src) 98.6 F (37 C) (Oral)  Resp 16  SpO2 100% Gen: Well NAD, pleasant HEENT: EOMI,  MMM, o/p erythematous with swollen tonsils but no exudate, TMs pearly bilaterally but flat, left EAM tender and mildly erythematous with no exudate Lymph: Right submandibular nontender LAD Neck: Supple Lungs: Normal work of breathing. CTABL, occasional cough Heart: RRR no MRG Abd: NABS, Soft. NT, ND Skin: Face mildly swollen with no erythema or exudate or lesion Neuro: Awake, alert, no focal deficit, CN 2-12 tested and intact, normal speech and gait  No results found for this or any previous visit (from the past 24 hour(s)). No results found.  Assessment and Plan: 57 y.o.  female likely with influenza with myalgias and chills, GI symptoms and URI. Mild facial swelling likely secondary to valsalva from vomiting. Also with otitis externa. Vitals stable and lungs clear. - zofran prn - work note provided. - clear warm liquids x 24 hours, rest, and handwashing encouraged. - chloraseptic or zinc losenges PRN. - otitis externa abx drops x 5-7 days and auralgen prn prescribed.      Leona Singleton, MD 04/09/13 [phone]

## 2013-04-09 NOTE — ED Provider Notes (Signed)
Medical screening examination/treatment/procedure(s) were performed by resident physician or non-physician practitioner and as supervising physician I was immediately available for consultation/collaboration.   Mykeria Garman DOUGLAS MD.   Rozalyn Osland D Dawn Kiper, MD 04/09/13 1704 

## 2013-04-12 ENCOUNTER — Ambulatory Visit (INDEPENDENT_AMBULATORY_CARE_PROVIDER_SITE_OTHER): Payer: No Typology Code available for payment source | Admitting: Physician Assistant

## 2013-04-12 VITALS — BP 122/84 | HR 81 | Temp 98.0°F | Resp 16 | Ht 63.5 in | Wt 198.2 lb

## 2013-04-12 DIAGNOSIS — Z7189 Other specified counseling: Secondary | ICD-10-CM

## 2013-04-12 DIAGNOSIS — Z23 Encounter for immunization: Secondary | ICD-10-CM

## 2013-04-12 NOTE — Progress Notes (Signed)
   Subjective:    Patient ID: Elizabeth Best, female    DOB: Nov 21, 1955, 57 y.o.   MRN: [id number]  HPI   Elizabeth Best is a very pleasant 57 yr old female here for immunizations prior to starting school at [employer] in January.  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  Requires proof of tetanus series plus Tdap.  Also requires proof of MMR or titer results.  She brings some partial records with her - she has positive mumps and rubella titers, but no documentation of measles titer.  She had a Tdap in 2013, but has no other records of tetanus immunization.  Prefers to receive MMR today rather than blood draw for titer.  No prior vaccine reaction.  She feels well today.      Review of Systems  Constitutional: Negative for fever and chills.  Respiratory: Negative.   Cardiovascular: Negative.   Gastrointestinal: Negative.   Musculoskeletal: Negative.   Skin: Negative.        Objective:   Physical Exam  Vitals reviewed. Constitutional: She is oriented to person, place, and time. She appears well-developed and well-nourished. No distress.  HENT:  Head: Normocephalic and atraumatic.  Eyes: Conjunctivae are normal. No scleral icterus.  Cardiovascular: Normal rate, regular rhythm and normal heart sounds.   Pulmonary/Chest: Effort normal and breath sounds normal. She has no wheezes. She has no rales.  Neurological: She is alert and oriented to person, place, and time.  Skin: Skin is warm and dry.  Psychiatric: She has a normal mood and affect. Her behavior is normal.       Assessment & Plan:  Immunization counseling - Plan: Td vaccine greater than or equal to 7yo preservative free IM, MMR vaccine subcutaneous  Need for tetanus booster - Plan: Td vaccine greater than or equal to 7yo preservative free IM  Need for MMR vaccine - Plan: MMR vaccine subcutaneous   Elizabeth Best is a pleasant 57 yr old female for immunizations prior to starting school in January.  Requires three doses of tetanus - Td  given today, will repeat this in one month then pt will have 3 tetanus immunizations (including Tdap from Sept 2013.)  Pt prefers repeating MMR vaccine today to drawing titers - vaccine administered.  Based on the ppw she brings with her, she only requires 1 dose of this, which she now meets.  Documentation provided to pt.  She will return for immunizations only in one month.   Loleta Dicker MHS, PA-C [facility] 12/22/20142:55 PM

## 2013-04-12 NOTE — Patient Instructions (Signed)
We have given you a tetanus vaccination and MMR vaccination today.  Please return in 1 month for your 3rd tetanus shot.     Immunization Schedule, Adult  Influenza vaccine.  All adults should be immunized every year.  All adults, including pregnant women and people with hives-only allergy to eggs can receive the inactivated influenza (IIV) vaccine.  Adults aged 55 49 years can receive the recombinant influenza (RIV) vaccine. The RIV vaccine does not contain any egg protein.  Adults aged 62 years or older can receive the standard-dose IIV or the high-dose IIV.  Tetanus, diphtheria, and acellular pertussis (Td, Tdap) vaccine.  Pregnant women should receive 1 dose of Tdap vaccine during each pregnancy. The dose should be obtained regardless of the length of time since the last dose. Immunization is preferred during the 27th to 36th week of gestation.  An adult who has not previously received Tdap or who does not know his or her vaccine status should receive 1 dose of Tdap. This initial dose should be followed by tetanus and diphtheria toxoids (Td) booster doses every 10 years.  Adults with an unknown or incomplete history of completing a 3-dose immunization series with Td-containing vaccines should begin or complete a primary immunization series including a Tdap dose.  Adults should receive a Td booster every 10 years.  Varicella vaccine.  An adult without evidence of immunity to varicella should receive 2 doses or a second dose if he or she has previously received 1 dose.  Pregnant females who do not have evidence of immunity should receive the first dose after pregnancy. This first dose should be obtained before leaving the health care facility. The second dose should be obtained 4 8 weeks after the first dose.  Human papillomavirus (HPV) vaccine.  Females aged 105 26 years who have not received the vaccine previously should obtain the 3-dose series.  The vaccine is not recommended  for use in pregnant females. However, pregnancy testing is not needed before receiving a dose. If a female is found to be pregnant after receiving a dose, no treatment is needed. In that case, the remaining doses should be delayed until after the pregnancy.  Males aged 72 21 years who have not received the vaccine previously should receive the 3-dose series. Males aged 79 26 years may be immunized.  Immunization is recommended through the age of 68 years for any female who has sex with males and did not get any or all doses earlier.  Immunization is recommended for any person with an immunocompromised condition through the age of 26 years if he or she did not get any or all doses earlier.  During the 3-dose series, the second dose should be obtained 4 8 weeks after the first dose. The third dose should be obtained 24 weeks after the first dose and 16 weeks after the second dose.  Zoster vaccine.  One dose is recommended for adults aged 107 years or older unless certain conditions are present.  Measles, mumps, and rubella (MMR) vaccine.  Adults born before 42 generally are considered immune to measles and mumps.  Adults born in [birth year] or later should have 1 or more doses of MMR vaccine unless there is a contraindication to the vaccine or there is laboratory evidence of immunity to each of the three diseases.  A routine second dose of MMR vaccine should be obtained at least 28 days after the first dose for students attending postsecondary schools, health care workers, or international travelers.  People  who received inactivated measles vaccine or an unknown type of measles vaccine during 1963 1967 should receive 2 doses of MMR vaccine.  People who received inactivated mumps vaccine or an unknown type of mumps vaccine before 1979 and are at high risk for mumps infection should consider immunization with 2 doses of MMR vaccine.  For females of childbearing age, rubella immunity should be  determined. If there is no evidence of immunity, females who are not pregnant should be vaccinated. If there is no evidence of immunity, females who are pregnant should delay immunization until after pregnancy.  Unvaccinated health care workers born before 50 who lack laboratory evidence of measles, mumps, or rubella immunity or laboratory confirmation of disease should consider measles and mumps immunization with 2 doses of MMR vaccine or rubella immunization with 1 dose of MMR vaccine.  Pneumococcal 13-valent conjugate (PCV13) vaccine.  When indicated, a person who is uncertain of his or her immunization history and has no record of immunization should receive the PCV13 vaccine.  An adult aged 33 years or older who has certain medical conditions and has not been previously immunized should receive 1 dose of PCV13 vaccine. This PCV13 should be followed with a dose of pneumococcal polysaccharide (PPSV23) vaccine. The PPSV23 vaccine dose should be obtained at least 8 weeks after the dose of PCV13 vaccine.  An adult aged 43 years or older who has certain medical conditions and previously received 1 or more doses of PPSV23 vaccine should receive 1 dose of PCV13. The PCV13 vaccine dose should be obtained 1 or more years after the last PPSV23 vaccine dose.  Pneumococcal polysaccharide (PPSV23) vaccine.  When PCV13 is also indicated, PCV13 should be obtained first.  All adults aged 2 years and older should be immunized.  An adult younger than age 75 years who has certain medical conditions should be immunized.  Any person who resides in a nursing home or long-term care facility should be immunized.  An adult smoker should be immunized.  People with an immunocompromised condition and certain other conditions should receive both PCV13 and PPSV23 vaccines.  People with human immunodeficiency virus (HIV) infection should be immunized as soon as possible after diagnosis.  Immunization during  chemotherapy or radiation therapy should be avoided.  Routine use of PPSV23 vaccine is not recommended for American Indians, [address], or people younger than 65 years unless there are medical conditions that require PPSV23 vaccine.  When indicated, people who have unknown immunization and have no record of immunization should receive PPSV23 vaccine.  One-time revaccination 5 years after the first dose of PPSV23 is recommended for people aged 66 64 years who have chronic kidney failure, nephrotic syndrome, asplenia, or immunocompromised conditions.  People who received 1 2 doses of PPSV23 before age 84 years should receive another dose of PPSV23 vaccine at age 76 years or later if at least 5 years have passed since the previous dose.  Doses of PPSV23 are not needed for people immunized with PPSV23 at or after age 52 years.  Meningococcal vaccine.  Adults with asplenia or persistent complement component deficiencies should receive 2 doses of quadrivalent meningococcal conjugate (MenACWY-D) vaccine. The doses should be obtained at least 2 months apart.  Microbiologists working with certain meningococcal bacteria, military recruits, people at risk during an outbreak, and people who travel to or live in countries with a high rate of meningitis should be immunized.  A first-year college student up through age 73 years who is living in a residence  hall should receive a dose if he or she did not receive a dose on or after his or her 16th birthday.  Adults who have certain high-risk conditions should receive one or more doses of vaccine.  Hepatitis A vaccine.  Adults who wish to be protected from this disease, have certain high-risk conditions, work with hepatitis A-infected animals, work in hepatitis A research labs, or travel to or work in countries with a high rate of hepatitis A should be immunized.  Adults who were previously unvaccinated and who anticipate close contact with an  international adoptee during the first 60 days after arrival in the [place] from a country with a high rate of hepatitis A should be immunized.  Hepatitis B vaccine.  Adults who wish to be protected from this disease, have certain high-risk conditions, may be exposed to blood or other infectious body fluids, are household contacts or sex partners of hepatitis B positive people, are clients or workers in certain care facilities, or travel to or work in countries with a high rate of hepatitis B should be immunized.  Haemophilus influenzae type b (Hib) vaccine.  A previously unvaccinated person with asplenia or sickle cell disease or having a scheduled splenectomy should receive 1 dose of Hib vaccine.  Regardless of previous immunization, a recipient of a hematopoietic stem cell transplant should receive a 3-dose series 6 12 months after his or her successful transplant.  Hib vaccine is not recommended for adults with HIV infection. Document Released: 06/29/2003 Document Revised: 08/03/2012 Document Reviewed: 05/26/2012 [facility] Patient Information 2014 [place], [place].

## 2013-07-22 ENCOUNTER — Encounter (HOSPITAL_COMMUNITY): Payer: Self-pay | Admitting: Emergency Medicine

## 2013-07-22 ENCOUNTER — Ambulatory Visit: Payer: 59

## 2013-07-22 ENCOUNTER — Emergency Department (INDEPENDENT_AMBULATORY_CARE_PROVIDER_SITE_OTHER)
Admission: EM | Admit: 2013-07-22 | Discharge: 2013-07-22 | Disposition: A | Discharge status: Home / Self Care | Payer: 59 | Source: Home / Self Care | Attending: Emergency Medicine | Admitting: Emergency Medicine

## 2013-07-22 DIAGNOSIS — M545 Low back pain, unspecified: Secondary | ICD-10-CM

## 2013-07-22 LAB — POCT URINALYSIS DIP (DEVICE)
BILIRUBIN URINE: NEGATIVE
GLUCOSE, UA: NEGATIVE mg/dL
Hgb urine dipstick: NEGATIVE
KETONES UR: NEGATIVE mg/dL
LEUKOCYTES UA: NEGATIVE
Nitrite: NEGATIVE
Protein, ur: NEGATIVE mg/dL
SPECIFIC GRAVITY, URINE: 1.02 (ref 1.005–1.030)
Urobilinogen, UA: 0.2 mg/dL (ref 0.0–1.0)
pH: 7 (ref 5.0–8.0)

## 2013-07-22 MED ORDER — CYCLOBENZAPRINE HCL 5 MG PO TABS: 5.0000 mg | ORAL_TABLET | Freq: Three times a day (TID) | ORAL | Status: DC | PRN

## 2013-07-22 MED ORDER — HYDROCODONE-ACETAMINOPHEN 5-325 MG PO TABS: ORAL_TABLET | ORAL | Status: DC

## 2013-07-22 MED ORDER — DICLOFENAC SODIUM 75 MG PO TBEC: 75.0000 mg | DELAYED_RELEASE_TABLET | Freq: Two times a day (BID) | ORAL | Status: DC

## 2013-07-22 MED ORDER — NICOTINE 21 MG/24HR TD PT24: 21.0000 mg | MEDICATED_PATCH | Freq: Every day | TRANSDERMAL | Status: DC

## 2013-07-22 NOTE — ED Notes (Signed)
C/o right flank and lower back pain x 2 days.  Gradually getting worse. Experiencing urgency and frequency with urinating.  Denies any other symptoms.  Pt has tried ibuprofen and tylenol for pain with no relief.

## 2013-07-22 NOTE — Discharge Instructions (Signed)
Do exercises twice daily followed by moist heat for 15 minutes. ° ° ° ° ° °Try to be as active as possible. ° °If no better in 2 weeks, follow up with orthopedist. ° ° °

## 2013-07-22 NOTE — ED Provider Notes (Signed)
Chief Complaint   Chief Complaint  Patient presents with  . Back Pain  . Flank Pain    History of Present Illness   Elizabeth Best is a 58 year old female who has had a two-day history of right CVA pain with radiation to the buttocks and down the leg as far as the thigh. The pain is worse if she lies on her right side, first thing in the morning, and when sitting. Nothing makes the pain better. She has had some urinary frequency, urgency, loose stools, and her thigh tingles. She denies any back injury. She has not had any fever, chills, nausea, vomiting, anterior abdominal pain, dysuria, hematuria, muscle weakness, bladder or bowel dysfunction, or saddle anesthesia.  Review of Systems   Other than as noted above, the patient denies any of the following symptoms: Systemic:  No fever, chills, or unexplained weight loss. GI:  No abdominal painor incontinence of bowel. GU:  No dysuria, frequency, urgency, or hematuria. No incontinence of urine or urinary retention.  M-S:  No neck pain or arthritis. Neuro:  No paresthesias, headache, saddle anesthesia, muscular weakness, or progressive neurological deficit.  Kline   Past medical history, family history, social history, meds, and allergies were reviewed. Specifically, there is no history of cancer, major trauma, osteoporosis, immunosuppression, or HIV infection.   Physical Examination    Vital signs:  BP 129/88  Pulse 74  Temp(Src) 98.6 F (37 C) (Oral)  Resp 18  SpO2 100% General:  Alert, oriented, in no distress. Abdomen:  Soft, non-tender.  No organomegaly or mass.  No pulsatile midline abdominal mass or bruit. Back:  There is tenderness to palpation in the right CVA area, right lower lumbar area, and right sacroiliac area. The back has a limited range of motion with pain. Straight leg raising is negative . Neuro:  Normal muscle strength, sensations and DTRs. Extremities: Pedal pulses were full, there was no edema. Skin:  Clear,  warm and dry.  No rash.  Labs   Results for orders placed during the hospital encounter of 07/22/13  POCT URINALYSIS DIP (DEVICE)      Result Value Ref Range   Glucose, UA NEGATIVE  NEGATIVE mg/dL   Bilirubin Urine NEGATIVE  NEGATIVE   Ketones, ur NEGATIVE  NEGATIVE mg/dL   Specific Gravity, Urine 1.020  1.005 - 1.030   Hgb urine dipstick NEGATIVE  NEGATIVE   pH 7.0  5.0 - 8.0   Protein, ur NEGATIVE  NEGATIVE mg/dL   Urobilinogen, UA 0.2  0.0 - 1.0 mg/dL   Nitrite NEGATIVE  NEGATIVE   Leukocytes, UA NEGATIVE  NEGATIVE   Assessment   The encounter diagnosis was Lumbago.  Plan     1.  Meds:  The following meds were prescribed:   Discharge Medication List as of 07/22/2013  1:26 PM    START taking these medications   Details  cyclobenzaprine (FLEXERIL) 5 MG tablet Take 1 tablet (5 mg total) by mouth 3 (three) times daily as needed for muscle spasms., Starting 07/22/2013, Until Discontinued, Normal    diclofenac (VOLTAREN) 75 MG EC tablet Take 1 tablet (75 mg total) by mouth 2 (two) times daily., Starting 07/22/2013, Until Discontinued, Normal    HYDROcodone-acetaminophen (NORCO/VICODIN) 5-325 MG per tablet 1 to 2 tabs every 4 to 6 hours as needed for pain., Print        2.  Patient Education/Counseling:  The patient was given appropriate handouts, self care instructions, and instructed in symptomatic relief. The patient was encouraged  to try to be as active as possible and given some exercises to do followed by moist heat.  3.  Follow up:  The patient was told to follow up here if no better in 3 to 4 days, or sooner if becoming worse in any way, and given some red flag symptoms such as worsening pain or new neurological symptoms which would prompt immediate return.  Follow up With Dr. Meridee Score if no better in 2 weeks.     Harden Mo, MD 07/22/13 [phone]

## 2013-07-23 LAB — URINE CULTURE
Colony Count: 7000
Special Requests: NORMAL

## 2013-10-29 ENCOUNTER — Ambulatory Visit (INDEPENDENT_AMBULATORY_CARE_PROVIDER_SITE_OTHER): Payer: 59 | Admitting: Physician Assistant

## 2013-10-29 VITALS — BP 110/70 | HR 77 | Temp 98.0°F | Resp 12 | Ht 63.5 in | Wt 194.0 lb

## 2013-10-29 DIAGNOSIS — Z23 Encounter for immunization: Secondary | ICD-10-CM

## 2013-10-29 NOTE — Progress Notes (Signed)
Elizabeth Best is here for immunization only.  See A/P from 04/12/13 OV:  "Elizabeth Best is a pleasant 58 yr old female for immunizations prior to starting school in January. Requires three doses of tetanus - Td given today, will repeat this in one month then pt will have 3 tetanus immunizations (including Tdap from Sept 2013.) Pt prefers repeating MMR vaccine today to drawing titers - vaccine administered. Based on the ppw she brings with her, she only requires 1 dose of this, which she now meets. Documentation provided to pt. She will return for immunizations only in one month."  She is here for Td - completing her required immunizations for school.  She was not seen by a provider today.

## 2013-12-31 ENCOUNTER — Ambulatory Visit (INDEPENDENT_AMBULATORY_CARE_PROVIDER_SITE_OTHER): Payer: 59 | Admitting: Family Medicine

## 2013-12-31 VITALS — BP 124/78 | HR 81 | Temp 97.8°F | Resp 18 | Ht 63.5 in | Wt 192.0 lb

## 2013-12-31 DIAGNOSIS — J329 Chronic sinusitis, unspecified: Secondary | ICD-10-CM

## 2013-12-31 DIAGNOSIS — R05 Cough: Secondary | ICD-10-CM

## 2013-12-31 DIAGNOSIS — J209 Acute bronchitis, unspecified: Secondary | ICD-10-CM

## 2013-12-31 DIAGNOSIS — R059 Cough, unspecified: Secondary | ICD-10-CM

## 2013-12-31 DIAGNOSIS — J029 Acute pharyngitis, unspecified: Secondary | ICD-10-CM

## 2013-12-31 LAB — POCT RAPID STREP A (OFFICE): Rapid Strep A Screen: NEGATIVE

## 2013-12-31 MED ORDER — AMOXICILLIN 875 MG PO TABS: 875.0000 mg | ORAL_TABLET | Freq: Two times a day (BID) | ORAL | Status: DC

## 2013-12-31 MED ORDER — HYDROCODONE-HOMATROPINE 5-1.5 MG/5ML PO SYRP: 5.0000 mL | ORAL_SOLUTION | Freq: Three times a day (TID) | ORAL | Status: DC | PRN

## 2013-12-31 NOTE — Patient Instructions (Signed)
Cough, Adult  A cough is a reflex that helps clear your throat and airways. It can help heal the body or may be a reaction to an irritated airway. A cough may only last 2 or 3 weeks (acute) or may last more than 8 weeks (chronic).  CAUSES Acute cough:  Viral or bacterial infections. Chronic cough:  Infections.  Allergies.  Asthma.  Post-nasal drip.  Smoking.  Heartburn or acid reflux.  Some medicines.  Chronic lung problems (COPD).  Cancer. SYMPTOMS   Cough.  Fever.  Chest pain.  Increased breathing rate.  High-pitched whistling sound when breathing (wheezing).  Colored mucus that you cough up (sputum). TREATMENT   A bacterial cough may be treated with antibiotic medicine.  A viral cough must run its course and will not respond to antibiotics.  Your caregiver may recommend other treatments if you have a chronic cough. HOME CARE INSTRUCTIONS   Only take over-the-counter or prescription medicines for pain, discomfort, or fever as directed by your caregiver. Use cough suppressants only as directed by your caregiver.  Use a cold steam vaporizer or humidifier in your bedroom or home to help loosen secretions.  Sleep in a semi-upright position if your cough is worse at night.  Rest as needed.  Stop smoking if you smoke. SEEK IMMEDIATE MEDICAL CARE IF:   You have pus in your sputum.  Your cough starts to worsen.  You cannot control your cough with suppressants and are losing sleep.  You begin coughing up blood.  You have difficulty breathing.  You develop pain which is getting worse or is uncontrolled with medicine.  You have a fever. MAKE SURE YOU:   Understand these instructions.  Will watch your condition.  Will get help right away if you are not doing well or get worse. Document Released: 10/05/2010 Document Revised: 07/01/2011 Document Reviewed: 10/05/2010 [employer] Patient Information 2015 [employer]. This information is not intended  to replace advice given to you by your health care provider. Make sure you discuss any questions you have with your health care provider. Sinusitis Sinusitis is redness, soreness, and inflammation of the paranasal sinuses. Paranasal sinuses are air pockets within the bones of your face (beneath the eyes, the middle of the forehead, or above the eyes). In healthy paranasal sinuses, mucus is able to drain out, and air is able to circulate through them by way of your nose. However, when your paranasal sinuses are inflamed, mucus and air can become trapped. This can allow bacteria and other germs to grow and cause infection. Sinusitis can develop quickly and last only a short time (acute) or continue over a long period (chronic). Sinusitis that lasts for more than 12 weeks is considered chronic.  CAUSES  Causes of sinusitis include:  Allergies.  Structural abnormalities, such as displacement of the cartilage that separates your nostrils (deviated septum), which can decrease the air flow through your nose and sinuses and affect sinus drainage.  Functional abnormalities, such as when the small hairs (cilia) that line your sinuses and help remove mucus do not work properly or are not present. SIGNS AND SYMPTOMS  Symptoms of acute and chronic sinusitis are the same. The primary symptoms are pain and pressure around the affected sinuses. Other symptoms include:  Upper toothache.  Earache.  Headache.  Bad breath.  Decreased sense of smell and taste.  A cough, which worsens when you are lying flat.  Fatigue.  Fever.  Thick drainage from your nose, which often is green and   may contain pus (purulent).  Swelling and warmth over the affected sinuses. DIAGNOSIS  Your health care provider will perform a physical exam. During the exam, your health care provider may:  Look in your nose for signs of abnormal growths in your nostrils (nasal polyps).  Tap over the affected sinus to check for signs  of infection.  View the inside of your sinuses (endoscopy) using an imaging device that has a light attached (endoscope). If your health care provider suspects that you have chronic sinusitis, one or more of the following tests may be recommended:  Allergy tests.  Nasal culture. A sample of mucus is taken from your nose, sent to a lab, and screened for bacteria.  Nasal cytology. A sample of mucus is taken from your nose and examined by your health care provider to determine if your sinusitis is related to an allergy. TREATMENT  Most cases of acute sinusitis are related to a viral infection and will resolve on their own within 10 days. Sometimes medicines are prescribed to help relieve symptoms (pain medicine, decongestants, nasal steroid sprays, or saline sprays).  However, for sinusitis related to a bacterial infection, your health care provider will prescribe antibiotic medicines. These are medicines that will help kill the bacteria causing the infection.  Rarely, sinusitis is caused by a fungal infection. In theses cases, your health care provider will prescribe antifungal medicine. For some cases of chronic sinusitis, surgery is needed. Generally, these are cases in which sinusitis recurs more than 3 times per year, despite other treatments. HOME CARE INSTRUCTIONS   Drink plenty of water. Water helps thin the mucus so your sinuses can drain more easily.  Use a humidifier.  Inhale steam 3 to 4 times a day (for example, sit in the bathroom with the shower running).  Apply a warm, moist washcloth to your face 3 to 4 times a day, or as directed by your health care provider.  Use saline nasal sprays to help moisten and clean your sinuses.  Take medicines only as directed by your health care provider.  If you were prescribed either an antibiotic or antifungal medicine, finish it all even if you start to feel better. SEEK IMMEDIATE MEDICAL CARE IF:  You have increasing pain or severe  headaches.  You have nausea, vomiting, or drowsiness.  You have swelling around your face.  You have vision problems.  You have a stiff neck.  You have difficulty breathing. MAKE SURE YOU:   Understand these instructions.  Will watch your condition.  Will get help right away if you are not doing well or get worse. Document Released: 04/08/2005 Document Revised: 08/23/2013 Document Reviewed: 04/23/2011 [employer] Patient Information 2015 [employer]. This information is not intended to replace advice given to you by your health care provider. Make sure you discuss any questions you have with your health care provider.  

## 2013-12-31 NOTE — Progress Notes (Signed)
Patient ID: Elizabeth Best MRN: [id number], DOB: 22-Mar-1956, 58 y.o. Date of Encounter: 12/31/2013, 8:23 AM  Primary Physician: No PCP Per Patient  Chief Complaint:  Chief Complaint  Patient presents with  . Chills    x1 week;  cold all the time  . Cough    x1 week; chest congestion; pt states nothing is coming up but when it does it is a thick yellow phelgm;  OTC Aleve, Nyquil has been taken with no rellief  . Fatigue    x1 week  . 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 Afebrile. No chills. Nasal congestion thick and green/yellow. Sinus pressure is the worst symptom. Cough is productive secondary to post nasal drip and not associated with time of day. Ears feel full, leading to sensation of muffled hearing. Has tried OTC cold preps without success. No GI complaints.   No recent antibiotics, recent travels, vomiting, or sick contacts   No leg trauma, sedentary periods, h/o cancer, or tobacco use.  Past Medical History  Diagnosis Date  . Hypertension      Home Meds: Prior to Admission medications   Medication Sig Start Date End Date Taking? Authorizing Provider  antipyrine-benzocaine Toniann Fail) otic solution Place 3-4 drops into the left ear every 2 (two) hours as needed for ear pain. 04/09/13   Hilton Sinclair, MD  cyclobenzaprine (FLEXERIL) 5 MG tablet Take 1 tablet (5 mg total) by mouth 3 (three) times daily as needed for muscle spasms. 07/22/13   Harden Mo, MD  diclofenac (VOLTAREN) 75 MG EC tablet Take 1 tablet (75 mg total) by mouth 2 (two) times daily. 07/22/13   Harden Mo, MD  nicotine (NICODERM CQ - DOSED IN MG/24 HOURS) 21 mg/24hr patch Place 1 patch (21 mg total) onto the skin daily. 07/22/13   Harden Mo, MD    Allergies: No Known Allergies  History   Social History  .  Marital Status: Single    Spouse Name: N/A    Number of Children: N/A  . Years of Education: N/A   Occupational History  . Not on file.   Social History Main Topics  . Smoking status: Current Every Day Smoker -- 0.50 packs/day    Types: Cigarettes  . Smokeless tobacco: Never Used  . Alcohol Use: No  . Drug Use: No  . Sexual Activity: Not on file   Other Topics Concern  . Not on file   Social History Narrative  . No narrative on file     Review of Systems: Constitutional: negative for chills, fever, night sweats or weight changes Cardiovascular: negative for chest pain or palpitations Respiratory: negative for hemoptysis, wheezing, or shortness of breath Abdominal: negative for abdominal pain, nausea, vomiting or diarrhea Dermatological: negative for rash Neurologic: negative for headache   Physical Exam: Blood pressure 124/78, pulse 81, temperature 97.8 F (36.6 C), temperature source Oral, resp. rate 18, height 5' 3.5" (1.613 m), weight 192 lb (87.091 kg), SpO2 98.00%., Body mass index is 33.47 kg/(m^2). General: Well developed, well nourished, in no acute distress. Head: Normocephalic, atraumatic, eyes without discharge, sclera non-icteric, nares are congested. Bilateral auditory canals clear, TM's are without perforation, pearly grey with reflective cone of light bilaterally. Serous effusion bilaterally behind TM's. Maxillary sinus TTP. Oral cavity moist, dentition  normal. Posterior pharynx with post nasal drip and mild erythema. No peritonsillar abscess or tonsillar exudate. Neck: Supple. No thyromegaly. Full ROM. No lymphadenopathy. Lungs: Clear bilaterally to auscultation without wheezes, rales, or rhonchi. Breathing is unlabored.  Heart: RRR with S1 S2. No murmurs, rubs, or gallops appreciated. Msk:  Strength and tone normal for age. Extremities: No clubbing or cyanosis. No edema. Neuro: Alert and oriented X 3. Moves all extremities spontaneously. CNII-XII grossly in  tact. Psych:  Responds to questions appropriately with a normal affect.   Labs: Results for orders placed during the hospital encounter of 07/22/13  URINE CULTURE      Result Value Ref Range   Specimen Description URINE, CLEAN CATCH     Special Requests Normal     Culture  Setup Time       Value: 07/22/2013 19:37     Performed at [facility] Count       Value: 7,000 COLONIES/ML     Performed at [facility]   Culture       Value: INSIGNIFICANT GROWTH     Performed at [facility]   Report Status 07/23/2013 FINAL    POCT URINALYSIS DIP (DEVICE)      Result Value Ref Range   Glucose, UA NEGATIVE  NEGATIVE mg/dL   Bilirubin Urine NEGATIVE  NEGATIVE   Ketones, ur NEGATIVE  NEGATIVE mg/dL   Specific Gravity, Urine 1.020  1.005 - 1.030   Hgb urine dipstick NEGATIVE  NEGATIVE   pH 7.0  5.0 - 8.0   Protein, ur NEGATIVE  NEGATIVE mg/dL   Urobilinogen, UA 0.2  0.0 - 1.0 mg/dL   Nitrite NEGATIVE  NEGATIVE   Leukocytes, UA NEGATIVE  NEGATIVE   Results for orders placed in visit on 12/31/13  POCT RAPID STREP A (OFFICE)      Result Value Ref Range   Rapid Strep A Screen Negative  Negative     ASSESSMENT AND PLAN:  58 y.o. year year old female with sinusitis - Cough - Plan: POCT rapid strep A, HYDROcodone-homatropine (HYCODAN) 5-1.5 MG/5ML syrup  Sinusitis, unspecified chronicity, unspecified location - Plan: amoxicillin (AMOXIL) 875 MG tablet  Acute bronchitis, unspecified organism - Plan: amoxicillin (AMOXIL) 875 MG tablet    -Tylenol/Motrin prn -Rest/fluids -RTC precautions -RTC 3-5 days if no improvement  Signed, Robyn Haber, MD 12/31/2013 8:23 AM

## 2014-06-29 ENCOUNTER — Ambulatory Visit (INDEPENDENT_AMBULATORY_CARE_PROVIDER_SITE_OTHER): Payer: BC Managed Care – PPO | Admitting: Family Medicine

## 2014-06-29 VITALS — BP 118/84 | HR 92 | Temp 98.2°F | Resp 18 | Wt 184.0 lb

## 2014-06-29 DIAGNOSIS — R197 Diarrhea, unspecified: Secondary | ICD-10-CM | POA: Diagnosis not present

## 2014-06-29 DIAGNOSIS — R112 Nausea with vomiting, unspecified: Secondary | ICD-10-CM | POA: Diagnosis not present

## 2014-06-29 DIAGNOSIS — A084 Viral intestinal infection, unspecified: Secondary | ICD-10-CM

## 2014-06-29 LAB — COMPREHENSIVE METABOLIC PANEL
ALT: 19 U/L (ref 0–35)
AST: 16 U/L (ref 0–37)
Albumin: 4.2 g/dL (ref 3.5–5.2)
Alkaline Phosphatase: 93 U/L (ref 39–117)
BUN: 13 mg/dL (ref 6–23)
CO2: 22 meq/L (ref 19–32)
CREATININE: 0.66 mg/dL (ref 0.50–1.10)
Calcium: 9.6 mg/dL (ref 8.4–10.5)
Chloride: 102 mEq/L (ref 96–112)
GLUCOSE: 73 mg/dL (ref 70–99)
Potassium: 4.1 mEq/L (ref 3.5–5.3)
Sodium: 138 mEq/L (ref 135–145)
Total Bilirubin: 0.6 mg/dL (ref 0.2–1.2)
Total Protein: 7.7 g/dL (ref 6.0–8.3)

## 2014-06-29 LAB — POCT CBC
GRANULOCYTE PERCENT: 85 % — AB (ref 37–80)
HEMATOCRIT: 46 % (ref 37.7–47.9)
Hemoglobin: 14.6 g/dL (ref 12.2–16.2)
LYMPH, POC: 1.4 (ref 0.6–3.4)
MCH, POC: 25 pg — AB (ref 27–31.2)
MCHC: 31.8 g/dL (ref 31.8–35.4)
MCV: 78.8 fL — AB (ref 80–97)
MID (CBC): 0.6 (ref 0–0.9)
MPV: 7.3 fL (ref 0–99.8)
POC GRANULOCYTE: 11.5 — AB (ref 2–6.9)
POC LYMPH %: 10.6 % (ref 10–50)
POC MID %: 4.4 %M (ref 0–12)
Platelet Count, POC: 367 10*3/uL (ref 142–424)
RBC: 5.84 M/uL — AB (ref 4.04–5.48)
RDW, POC: 15.9 %
WBC: 13.5 10*3/uL — AB (ref 4.6–10.2)

## 2014-06-29 MED ORDER — ONDANSETRON 4 MG PO TBDP: 4.0000 mg | ORAL_TABLET | Freq: Three times a day (TID) | ORAL | Status: DC | PRN

## 2014-06-29 MED ORDER — ONDANSETRON 4 MG PO TBDP
8.0000 mg | ORAL_TABLET | Freq: Once | ORAL | Status: AC
  Administered 2014-06-29: 8 mg via ORAL

## 2014-06-29 NOTE — Progress Notes (Signed)
[facility] [address] [place] [address] [phone]  Date:  06/29/2014   Name:  Elizabeth Best   DOB:  06/17/1955   MRN:  [id number]  PCP:  No PCP Per Patient    Chief Complaint: Emesis and Headache   History of Present Illness:  Elizabeth Best is a 59 y.o. very pleasant female patient who presents with the following:  She is here today with nausea and vomiting since 6 pm yesterday. She has some watery stools.   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   She also has noted a headache.  She really cannot keep anything down but admits that she has not tried because she has so much nausea.     There are no active problems to display for this patient.   Past Medical History  Diagnosis Date  . Hypertension     Past Surgical History  Procedure Laterality Date  . Cesarean section    . Abdominal hysterectomy    . Brain surgery    . Breast surgery      History  Substance Use Topics  . Smoking status: Current Every Day Smoker -- 0.50 packs/day    Types: Cigarettes  . Smokeless tobacco: Never Used  . Alcohol Use: No    No family history on file.  No Known Allergies  Medication list has been reviewed and updated.  No current outpatient prescriptions on file prior to visit.   No current facility-administered medications on file prior to visit.    Review of Systems:  As per HPI- otherwise negative.   Physical Examination: Filed Vitals:   06/29/14 1023  BP: 118/84  Pulse: 92  Temp: 98.2 F (36.8 C)  Resp: 18   Filed Vitals:   06/29/14 1023  Weight: 184 lb (83.462 kg)   Body mass index is 32.08 kg/(m^2). Ideal Body Weight:    GEN: WDWN, NAD, Non-toxic, A & O x 3, obese, appears to not feel  well HEENT: Atraumatic, Normocephalic. Neck supple. No masses, No LAD. Ears and Nose: No external deformity. CV: RRR, No M/G/R. No JVD. No thrill. No extra heart sounds. PULM: CTA B, no wheezes, crackles, rhonchi. No retractions. No resp. distress. No accessory muscle use. ABD: S, NT, ND, +BS. No rebound. No HSM.  Mild tenderness in upper abdomen only EXTR: No c/c/e NEURO Normal gait.  PSYCH: Normally interactive. Conversant. Not depressed or anxious appearing.  Calm demeanor.   given zofran 8mg  PO once, started IV for hydration.  zofran helped to settle her stomach and she sipped some gatorade  Results for orders placed or performed in visit on 06/29/14  POCT CBC  Result Value Ref Range   WBC 13.5 (A) 4.6 - 10.2 K/uL   Lymph, poc 1.4 0.6 - 3.4   POC LYMPH PERCENT 10.6 10 - 50 %L   MID (cbc) 0.6 0 - 0.9   POC MID % 4.4 0 - 12 %M   POC Granulocyte 11.5 (A) 2 - 6.9   Granulocyte percent 85.0 (A) 37 - 80 %G   RBC 5.84 (A) 4.04 - 5.48 M/uL   Hemoglobin 14.6 12.2 - 16.2 g/dL   HCT, POC 46.0 37.7 - 47.9 %  MCV 78.8 (A) 80 - 97 fL   MCH, POC 25.0 (A) 27 - 31.2 pg   MCHC 31.8 31.8 - 35.4 g/dL   RDW, POC 15.9 %   Platelet Count, POC 367 142 - 424 K/uL   MPV 7.3 0 - 99.8 fL   Given 750 ml of saline- IV stopped as it was running slow and she was ready to go home. I DC'd IV line She noted that she felt a lot better, her abd discomfort was much decreased   Assessment and Plan: Nausea and vomiting, vomiting of unspecified type - Plan: ondansetron (ZOFRAN-ODT) disintegrating tablet 8 mg, POCT CBC, Comprehensive metabolic panel, ondansetron (ZOFRAN ODT) 4 MG disintegrating tablet  Diarrhea  Viral gastroenteritis  Gastroenteritis, likely infectious and caught at school She is feeling better after zofran and IVF.   Sent home with an rx for zofran.  She will let me know if not feeling better in the next couple of days She will see DR. Tamala Julian soon to establish care/ have a  CPE  Signed Lamar Blinks, MD

## 2014-06-29 NOTE — Patient Instructions (Signed)
Rest and drink plenty of fluids.  When you feel like eating start with bland foods such as toast, bananas, applesauce.  If you do not feel better over the next 1-2 days please call me or come back in Use the oral zofran as needed for nausea and vomiting.

## 2014-06-30 ENCOUNTER — Encounter: Payer: Self-pay | Admitting: Family Medicine

## 2014-07-06 ENCOUNTER — Encounter: Payer: Self-pay | Admitting: Family Medicine

## 2014-07-06 ENCOUNTER — Ambulatory Visit (INDEPENDENT_AMBULATORY_CARE_PROVIDER_SITE_OTHER): Payer: BC Managed Care – PPO | Admitting: Family Medicine

## 2014-07-06 VITALS — BP 105/72 | HR 77 | Temp 97.9°F | Resp 16 | Ht 64.0 in | Wt 182.0 lb

## 2014-07-06 DIAGNOSIS — F1911 Other psychoactive substance abuse, in remission: Secondary | ICD-10-CM

## 2014-07-06 DIAGNOSIS — Z87898 Personal history of other specified conditions: Secondary | ICD-10-CM

## 2014-07-06 DIAGNOSIS — Z23 Encounter for immunization: Secondary | ICD-10-CM

## 2014-07-06 DIAGNOSIS — H539 Unspecified visual disturbance: Secondary | ICD-10-CM | POA: Diagnosis not present

## 2014-07-06 DIAGNOSIS — E669 Obesity, unspecified: Secondary | ICD-10-CM | POA: Diagnosis not present

## 2014-07-06 DIAGNOSIS — F32A Depression, unspecified: Secondary | ICD-10-CM

## 2014-07-06 DIAGNOSIS — H9193 Unspecified hearing loss, bilateral: Secondary | ICD-10-CM | POA: Diagnosis not present

## 2014-07-06 DIAGNOSIS — R5383 Other fatigue: Secondary | ICD-10-CM

## 2014-07-06 DIAGNOSIS — F329 Major depressive disorder, single episode, unspecified: Secondary | ICD-10-CM | POA: Diagnosis not present

## 2014-07-06 DIAGNOSIS — G4700 Insomnia, unspecified: Secondary | ICD-10-CM

## 2014-07-06 DIAGNOSIS — R21 Rash and other nonspecific skin eruption: Secondary | ICD-10-CM | POA: Diagnosis not present

## 2014-07-06 MED ORDER — SERTRALINE HCL 50 MG PO TABS: 50.0000 mg | ORAL_TABLET | Freq: Every day | ORAL | Status: DC

## 2014-07-06 MED ORDER — TRAZODONE HCL 50 MG PO TABS: 25.0000 mg | ORAL_TABLET | Freq: Every evening | ORAL | Status: DC | PRN
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
## 2014-07-06 NOTE — Patient Instructions (Signed)

## 2014-07-17 ENCOUNTER — Encounter: Payer: Self-pay | Admitting: Family Medicine

## 2014-07-17 DIAGNOSIS — F1911 Other psychoactive substance abuse, in remission: Secondary | ICD-10-CM | POA: Insufficient documentation

## 2014-07-17 DIAGNOSIS — F329 Major depressive disorder, single episode, unspecified: Secondary | ICD-10-CM | POA: Insufficient documentation

## 2014-07-17 DIAGNOSIS — E66811 Obesity, class 1: Secondary | ICD-10-CM | POA: Insufficient documentation

## 2014-07-17 DIAGNOSIS — G4700 Insomnia, unspecified: Secondary | ICD-10-CM | POA: Insufficient documentation

## 2014-07-17 DIAGNOSIS — E669 Obesity, unspecified: Secondary | ICD-10-CM | POA: Insufficient documentation

## 2014-07-17 DIAGNOSIS — F411 Generalized anxiety disorder: Secondary | ICD-10-CM | POA: Insufficient documentation

## 2014-07-18 ENCOUNTER — Encounter: Payer: Self-pay | Admitting: Family Medicine

## 2014-07-18 ENCOUNTER — Ambulatory Visit (INDEPENDENT_AMBULATORY_CARE_PROVIDER_SITE_OTHER): Payer: BC Managed Care – PPO | Admitting: Family Medicine

## 2014-07-18 VITALS — BP 124/80 | HR 69 | Temp 97.9°F | Resp 16 | Ht 63.5 in | Wt 186.4 lb

## 2014-07-18 DIAGNOSIS — Z79899 Other long term (current) drug therapy: Secondary | ICD-10-CM | POA: Diagnosis not present

## 2014-07-18 DIAGNOSIS — Z209 Contact with and (suspected) exposure to unspecified communicable disease: Secondary | ICD-10-CM | POA: Diagnosis not present

## 2014-07-18 DIAGNOSIS — Z720 Tobacco use: Secondary | ICD-10-CM | POA: Diagnosis not present

## 2014-07-18 DIAGNOSIS — E669 Obesity, unspecified: Secondary | ICD-10-CM

## 2014-07-18 DIAGNOSIS — F32A Depression, unspecified: Secondary | ICD-10-CM

## 2014-07-18 DIAGNOSIS — Z1239 Encounter for other screening for malignant neoplasm of breast: Secondary | ICD-10-CM | POA: Diagnosis not present

## 2014-07-18 DIAGNOSIS — R102 Pelvic and perineal pain: Secondary | ICD-10-CM | POA: Diagnosis not present

## 2014-07-18 DIAGNOSIS — Z0000 Encounter for general adult medical examination without abnormal findings: Secondary | ICD-10-CM | POA: Diagnosis not present

## 2014-07-18 DIAGNOSIS — N951 Menopausal and female climacteric states: Secondary | ICD-10-CM

## 2014-07-18 DIAGNOSIS — E66811 Obesity, class 1: Secondary | ICD-10-CM

## 2014-07-18 DIAGNOSIS — Z23 Encounter for immunization: Secondary | ICD-10-CM | POA: Diagnosis not present

## 2014-07-18 DIAGNOSIS — Z131 Encounter for screening for diabetes mellitus: Secondary | ICD-10-CM | POA: Diagnosis not present

## 2014-07-18 DIAGNOSIS — Z01419 Encounter for gynecological examination (general) (routine) without abnormal findings: Secondary | ICD-10-CM | POA: Diagnosis not present

## 2014-07-18 DIAGNOSIS — F329 Major depressive disorder, single episode, unspecified: Secondary | ICD-10-CM | POA: Diagnosis not present

## 2014-07-18 DIAGNOSIS — Z13220 Encounter for screening for lipoid disorders: Secondary | ICD-10-CM

## 2014-07-18 LAB — CBC WITH DIFFERENTIAL/PLATELET
Basophils Absolute: 0 10*3/uL (ref 0.0–0.1)
Basophils Relative: 0 % (ref 0–1)
EOS ABS: 0.1 10*3/uL (ref 0.0–0.7)
Eosinophils Relative: 2 % (ref 0–5)
HEMATOCRIT: 42.5 % (ref 36.0–46.0)
Hemoglobin: 13.9 g/dL (ref 12.0–15.0)
Lymphocytes Relative: 35 % (ref 12–46)
Lymphs Abs: 2.5 10*3/uL (ref 0.7–4.0)
MCH: 25.2 pg — ABNORMAL LOW (ref 26.0–34.0)
MCHC: 32.7 g/dL (ref 30.0–36.0)
MCV: 77.1 fL — AB (ref 78.0–100.0)
MONO ABS: 0.4 10*3/uL (ref 0.1–1.0)
MPV: 9.2 fL (ref 8.6–12.4)
Monocytes Relative: 5 % (ref 3–12)
NEUTROS ABS: 4.1 10*3/uL (ref 1.7–7.7)
Neutrophils Relative %: 58 % (ref 43–77)
Platelets: 440 10*3/uL — ABNORMAL HIGH (ref 150–400)
RBC: 5.51 MIL/uL — ABNORMAL HIGH (ref 3.87–5.11)
RDW: 15.9 % — ABNORMAL HIGH (ref 11.5–15.5)
WBC: 7 10*3/uL (ref 4.0–10.5)

## 2014-07-18 LAB — TSH: TSH: 0.778 u[IU]/mL (ref 0.350–4.500)

## 2014-07-18 LAB — COMPREHENSIVE METABOLIC PANEL
ALT: 21 U/L (ref 0–35)
AST: 14 U/L (ref 0–37)
Albumin: 4 g/dL (ref 3.5–5.2)
Alkaline Phosphatase: 90 U/L (ref 39–117)
BUN: 9 mg/dL (ref 6–23)
CO2: 26 mEq/L (ref 19–32)
Calcium: 9.8 mg/dL (ref 8.4–10.5)
Chloride: 106 mEq/L (ref 96–112)
Creat: 0.69 mg/dL (ref 0.50–1.10)
Glucose, Bld: 77 mg/dL (ref 70–99)
Potassium: 4.2 mEq/L (ref 3.5–5.3)
SODIUM: 141 meq/L (ref 135–145)
Total Bilirubin: 0.3 mg/dL (ref 0.2–1.2)
Total Protein: 7 g/dL (ref 6.0–8.3)

## 2014-07-18 LAB — POCT WET PREP WITH KOH
Clue Cells Wet Prep HPF POC: NEGATIVE
KOH Prep POC: NEGATIVE
RBC Wet Prep HPF POC: NEGATIVE
Trichomonas, UA: NEGATIVE
Yeast Wet Prep HPF POC: NEGATIVE

## 2014-07-18 LAB — POCT URINALYSIS DIPSTICK
Bilirubin, UA: NEGATIVE
Glucose, UA: NEGATIVE
KETONES UA: NEGATIVE
Leukocytes, UA: NEGATIVE
Nitrite, UA: NEGATIVE
PROTEIN UA: NEGATIVE
RBC UA: NEGATIVE
SPEC GRAV UA: 1.025
UROBILINOGEN UA: 0.2
pH, UA: 5.5

## 2014-07-18 LAB — LIPID PANEL
CHOL/HDL RATIO: 6.2 ratio
Cholesterol: 198 mg/dL (ref 0–200)
HDL: 32 mg/dL — ABNORMAL LOW (ref >=46)
LDL Cholesterol: 136 mg/dL — ABNORMAL HIGH (ref 0–99)
TRIGLYCERIDES: 150 mg/dL — AB (ref <150)
VLDL: 30 mg/dL (ref 0–40)

## 2014-07-18 LAB — VITAMIN B12: Vitamin B-12: 457 pg/mL (ref 211–911)

## 2014-07-18 MED ORDER — ESTRADIOL 0.5 MG PO TABS: 0.5000 mg | ORAL_TABLET | Freq: Every day | ORAL | Status: DC

## 2014-07-18 NOTE — Patient Instructions (Signed)
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
## 2014-07-19 LAB — VITAMIN D 25 HYDROXY (VIT D DEFICIENCY, FRACTURES): VIT D 25 HYDROXY: 22 ng/mL — AB (ref 30–100)

## 2014-07-19 LAB — PAP IG (IMAGE GUIDED)

## 2014-07-19 LAB — HEMOGLOBIN A1C
HEMOGLOBIN A1C: 5.8 % — AB (ref <5.7)
Mean Plasma Glucose: 120 mg/dL — ABNORMAL HIGH (ref <117)

## 2014-08-02 ENCOUNTER — Encounter (INDEPENDENT_AMBULATORY_CARE_PROVIDER_SITE_OTHER): Payer: Self-pay

## 2014-08-02 ENCOUNTER — Ambulatory Visit
Admission: RE | Admit: 2014-08-02 | Discharge: 2014-08-02 | Disposition: A | Discharge status: Home / Self Care | Payer: BC Managed Care – PPO | Source: Ambulatory Visit | Attending: Family Medicine | Admitting: Family Medicine

## 2014-08-02 DIAGNOSIS — Z1239 Encounter for other screening for malignant neoplasm of breast: Secondary | ICD-10-CM

## 2014-08-31 ENCOUNTER — Ambulatory Visit (INDEPENDENT_AMBULATORY_CARE_PROVIDER_SITE_OTHER): Payer: BC Managed Care – PPO | Admitting: Family Medicine

## 2014-08-31 VITALS — BP 126/82 | HR 79 | Temp 97.7°F | Resp 16 | Ht 64.25 in | Wt 189.6 lb

## 2014-08-31 DIAGNOSIS — R112 Nausea with vomiting, unspecified: Secondary | ICD-10-CM

## 2014-08-31 LAB — POCT CBC
Granulocyte percent: 63 %G (ref 37–80)
HCT, POC: 42.8 % (ref 37.7–47.9)
Hemoglobin: 13.4 g/dL (ref 12.2–16.2)
LYMPH, POC: 2.7 (ref 0.6–3.4)
MCH, POC: 24.3 pg — AB (ref 27–31.2)
MCHC: 31.3 g/dL — AB (ref 31.8–35.4)
MCV: 77.8 fL — AB (ref 80–97)
MID (cbc): 0.5 (ref 0–0.9)
MPV: 7 fL (ref 0–99.8)
POC GRANULOCYTE: 5.5 (ref 2–6.9)
POC LYMPH PERCENT: 31.6 %L (ref 10–50)
POC MID %: 5.4 %M (ref 0–12)
Platelet Count, POC: 380 10*3/uL (ref 142–424)
RBC: 5.5 M/uL — AB (ref 4.04–5.48)
RDW, POC: 16.1 %
WBC: 8.7 10*3/uL (ref 4.6–10.2)

## 2014-08-31 MED ORDER — ONDANSETRON HCL 8 MG PO TABS: 8.0000 mg | ORAL_TABLET | Freq: Three times a day (TID) | ORAL | Status: DC | PRN

## 2014-08-31 MED ORDER — ONDANSETRON 4 MG PO TBDP: 4.0000 mg | ORAL_TABLET | Freq: Once | ORAL | Status: DC

## 2014-08-31 NOTE — Patient Instructions (Signed)
Your white blood cell count is normal.  Use the zofran as needed for nausea, drink plenty of fluids and try bland foods when you feel able Let me know if you are not better in the next few days- Sooner if worse.
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
## 2014-11-10 ENCOUNTER — Other Ambulatory Visit: Payer: Self-pay | Admitting: Family Medicine

## 2014-11-11 ENCOUNTER — Telehealth: Payer: Self-pay

## 2014-12-16 ENCOUNTER — Other Ambulatory Visit: Payer: Self-pay | Admitting: Family Medicine

## 2014-12-18 ENCOUNTER — Telehealth: Payer: Self-pay | Admitting: Family Medicine

## 2014-12-18 NOTE — Telephone Encounter (Signed)
Patient sated she will need an office visit in order to receive a refill on Estrace 0.5 MG.  Patient stated she is unable to come in because of her work schedule. Patient is requesting for Dr. Tamala Julian to call in a refill of estrace 0.5 until she is able to come in for a office visit. [phone].

## 2014-12-19 MED ORDER — ESTRADIOL 0.5 MG PO TABS: ORAL_TABLET | ORAL | Status: DC

## 2014-12-19 NOTE — Telephone Encounter (Signed)
Patient called to check the status of her medication. I let her know that Dr. Tamala Julian approved the refill and sent to her pharmacy.

## 2014-12-19 NOTE — Telephone Encounter (Signed)
Refill of Estrace approved and sent to pharmacy.  Please advise pt.

## 2015-01-16 ENCOUNTER — Ambulatory Visit (INDEPENDENT_AMBULATORY_CARE_PROVIDER_SITE_OTHER): Payer: BC Managed Care – PPO | Admitting: Family Medicine

## 2015-01-16 VITALS — BP 129/83 | HR 81 | Temp 97.8°F | Resp 24 | Ht 63.5 in | Wt 195.4 lb

## 2015-01-16 DIAGNOSIS — G4700 Insomnia, unspecified: Secondary | ICD-10-CM

## 2015-01-16 DIAGNOSIS — R05 Cough: Secondary | ICD-10-CM

## 2015-01-16 DIAGNOSIS — N951 Menopausal and female climacteric states: Secondary | ICD-10-CM | POA: Diagnosis not present

## 2015-01-16 DIAGNOSIS — R232 Flushing: Secondary | ICD-10-CM

## 2015-01-16 DIAGNOSIS — R059 Cough, unspecified: Secondary | ICD-10-CM

## 2015-01-16 MED ORDER — AZITHROMYCIN 250 MG PO TABS: ORAL_TABLET | ORAL | Status: DC

## 2015-01-16 MED ORDER — FLUOXETINE HCL (PMDD) 10 MG PO CAPS: 10.0000 mg | ORAL_CAPSULE | Freq: Every day | ORAL | Status: DC

## 2015-01-16 MED ORDER — ESTRADIOL 1 MG PO TABS: 1.0000 mg | ORAL_TABLET | Freq: Every day | ORAL | Status: DC

## 2015-01-16 NOTE — Progress Notes (Signed)
This chart was scribed for Robyn Haber, MD by [name], medical scribe at [facility].The patient was seen in exam room 8 and the patient's care was started at 7:51 PM.  Patient ID: Elizabeth Best MRN: [id number], DOB: 1955/06/08, 59 y.o. Date of Encounter: 01/16/2015  Primary Physician: [name], MD  Chief Complaint:  Chief Complaint  Patient presents with   Medication Refill    Estradiol    HPI:  Elizabeth Best is a 59 y.o. female who presents to [facility] for medication refill.  She's currently taking estradiol for hot flashes. She's been taking it no more than 6 months. She had a hysterectomy in 1995 and has hot flashes since. She previously took progestin back in 1997-98 but it made her bones really sore. She gets a mammogram annually.   She also has some cough for about 2 weeks. She bought some OTC medication but to no relief. She is a smoker and thinks about quitting.   She doesn't sleep well.  Has tried a variety of sleep medicines, but they all made her groggy upon awakening.  This continues to be a problem over many years.  She works for [employer] as a Sports coach.   Past Medical History  Diagnosis Date   Osteopenia    Depression    Substance abuse     History of crack cocaine abuse.     Home Meds: Prior to Admission medications   Medication Sig Start Date End Date Taking? Authorizing Provider  estradiol (ESTRACE) 0.5 MG tablet TAKE ONE TABLET BY MOUTH ONCE DAILY. 12/19/14  Yes Wardell Honour, MD    Allergies: No Known Allergies  Social History   Social History   Marital Status: Single    Spouse Name: N/A   Number of Children: 1   Years of Education: N/A   Occupational History   custodian [employer]   Social History Main Topics   Smoking status: Current Every Day Smoker -- 0.50 packs/day    Types: Cigarettes   Smokeless tobacco: Never Used   Alcohol Use: No    Drug Use: No     Comment: history of crack cocaine abuse   Sexual Activity:    Partners: Female     Comment: Same sex partners   Other Topics Concern   Not on file   Social History Narrative   Marital status:  Single; not dating in years; same sexual partners      Children:  1 son (65); 6 grandchildren.  Son in [place]; grandchildren in [place].      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      Tobacco:  1/2 ppd x 30 years; never quit.  Quit once for one month with patch.       Alcohol:  None      Drugs: none currently; history of crack cocaine abuse.     Review of Systems: Constitutional: negative for chills, fever, night sweats, weight changes, or fatigue  HEENT: negative for vision changes, hearing loss, rhinorrhea, ST, epistaxis, or sinus pressure; positive for congestion Cardiovascular: negative for chest pain or palpitations Respiratory: negative for hemoptysis, wheezing, shortness of breath; positive for cough Abdominal: negative for abdominal pain, nausea, vomiting, diarrhea, or constipation Dermatological: negative for rash Neurologic: negative for headache, dizziness, or syncope All other systems reviewed and  are otherwise negative with the exception to those above and in the HPI.  Physical Exam: Blood pressure 129/83, pulse 81, temperature 97.8 F (36.6 C), temperature source Oral, resp. rate 24, height 5' 3.5" (1.613 m), weight 195 lb 6 oz (88.622 kg), SpO2 96 %., Body mass index is 34.06 kg/(m^2). General: Well developed, well nourished, in no acute distress. Head: Normocephalic, atraumatic, eyes without discharge, sclera non-icteric, nares are without discharge. Bilateral auditory canals clear, TM's are without perforation, pearly grey and translucent with reflective cone of light bilaterally.  Neck: Supple. No thyromegaly. Full ROM. No lymphadenopathy. Lungs: Clear  bilaterally to auscultation without wheezes, rales, or rhonchi. Breathing is unlabored. Heart: RRR with S1 S2. No murmurs, rubs, or gallops appreciated. Abdomen: Soft, non-tender, non-distended with normoactive bowel sounds. No hepatomegaly. No rebound/guarding. No obvious abdominal masses. Msk:  Strength and tone normal for age. Extremities/Skin: Warm and dry. No clubbing or cyanosis. No edema. No rashes or suspicious lesions. Neuro: Alert and oriented X 3. Moves all extremities spontaneously. Gait is normal. CNII-XII grossly in tact. Psych:  Responds to questions appropriately with a normal affect.     ASSESSMENT AND PLAN:  59 y.o. year old female with hot flashes x 15 years, cough x 2 weeks as a smoker, and insomnia. This chart was scribed in my presence and reviewed by me personally.    ICD-9-CM ICD-10-CM   1. Cough 786.2 R05 azithromycin (ZITHROMAX) 250 MG tablet  2. Hot flashes 627.2 N95.1 estradiol (ESTRACE) 1 MG tablet  3. Insomnia 780.52 G47.00 Fluoxetine HCl, PMDD, 10 MG CAPS     Signed, Robyn Haber, MD    By signing my name below, I, [name], attest that this documentation has been prepared under the direction and in the presence of Robyn Haber, MD. Electronically Signed: [name], [place]. 01/16/2015 , 7:51 PM .  Signed, Robyn Haber, MD 01/16/2015 7:51 PM

## 2015-01-25 ENCOUNTER — Telehealth: Payer: Self-pay

## 2015-01-25 NOTE — Telephone Encounter (Signed)
Pharm called and reported that they can not get the fluoxetine PMDD in and want permission to fill Rx with regular fluoxetine caps. Dr L, I wanted to make sure you didn't Rx the PMDD on purpose. I have pended the reg, please review and sign if OK. Also, orig Rx was written for 1 cap QD, but for #60. Did you mean to order #90 w/3 RFs?

## 2015-01-25 NOTE — Telephone Encounter (Signed)
Right.  I just want regular fluoxetine as written

## 2015-01-26 MED ORDER — FLUOXETINE HCL 10 MG PO CAPS: 10.0000 mg | ORAL_CAPSULE | Freq: Every day | ORAL | Status: DC

## 2015-01-26 NOTE — Telephone Encounter (Signed)
Advised pharm and sent in.

## 2015-01-27 ENCOUNTER — Telehealth: Payer: Self-pay

## 2015-01-27 DIAGNOSIS — G4700 Insomnia, unspecified: Secondary | ICD-10-CM

## 2015-01-27 DIAGNOSIS — E669 Obesity, unspecified: Secondary | ICD-10-CM

## 2015-01-27 NOTE — Telephone Encounter (Signed)
Call --- upon review of Dr. Pauletta Browns note, looks like all sleep aides that patient has tried have made her groggy the following morning; thus, he wrote for Prozac which will treat underlying depression and anxiety.Marland Kitchen...which are the usual causes of insomnia.  Prozac will not make pt groggy at all but should eventually help insomnia.  Would recommend adding Melatonin at bedtime (OTC); I prescribed Trazodone for her in 06/2014.  Was it not effective?  How many tablets was she taking at bedtime?

## 2015-01-27 NOTE — Telephone Encounter (Signed)
Pt states she was seen recently and was given prozac and she was needing something to sleep not for depression and would like to know if dr Tamala Julian could call something different in for her

## 2015-01-27 NOTE — Telephone Encounter (Signed)
Patient is ready for her to be called back.  [phone]

## 2015-01-30 NOTE — Telephone Encounter (Signed)
Pt was very upset that she did not received a call from [place] about her phone call.  She called back on Friday and April did not give call to clinical personell.  She came into the appt center wanting to speak with Dr. Tamala Julian.  I sat and talk with pt about Dr. Tamala Julian note.  She has taken Melantonin in the past and that did not work for her.  She stated that she read the side effects for the Prozac and did not want to take it.  She felt that the trazodone was not working.  After speaking with her she did not take the medication long enough and took it right before bed.  I advised her to take the trazodone 6-8 hours before she has to get up and that will probably help with the grogginess and that if the 1/2 tab does not work then go up to the 1 tab.  If that 1 tab does not work to then to call back or either come in.    Also pt would like to schedule a sleep study, can we do that for her?

## 2015-02-01 NOTE — Telephone Encounter (Signed)
Sleep study order placed

## 2015-02-27 ENCOUNTER — Institutional Professional Consult (permissible substitution): Payer: BC Managed Care – PPO | Admitting: Neurology

## 2015-03-08 ENCOUNTER — Institutional Professional Consult (permissible substitution): Payer: BC Managed Care – PPO | Admitting: Neurology

## 2015-06-17 ENCOUNTER — Ambulatory Visit (INDEPENDENT_AMBULATORY_CARE_PROVIDER_SITE_OTHER): Payer: BC Managed Care – PPO | Admitting: Emergency Medicine

## 2015-06-17 ENCOUNTER — Ambulatory Visit (INDEPENDENT_AMBULATORY_CARE_PROVIDER_SITE_OTHER): Payer: BC Managed Care – PPO

## 2015-06-17 VITALS — BP 130/82 | HR 85 | Temp 98.0°F | Resp 18 | Ht 64.0 in | Wt 189.0 lb

## 2015-06-17 DIAGNOSIS — E669 Obesity, unspecified: Secondary | ICD-10-CM | POA: Diagnosis not present

## 2015-06-17 DIAGNOSIS — R05 Cough: Secondary | ICD-10-CM

## 2015-06-17 DIAGNOSIS — Z0000 Encounter for general adult medical examination without abnormal findings: Secondary | ICD-10-CM

## 2015-06-17 DIAGNOSIS — Z119 Encounter for screening for infectious and parasitic diseases, unspecified: Secondary | ICD-10-CM | POA: Diagnosis not present

## 2015-06-17 DIAGNOSIS — J189 Pneumonia, unspecified organism: Secondary | ICD-10-CM | POA: Diagnosis not present

## 2015-06-17 DIAGNOSIS — Z13220 Encounter for screening for lipoid disorders: Secondary | ICD-10-CM | POA: Diagnosis not present

## 2015-06-17 DIAGNOSIS — Z1239 Encounter for other screening for malignant neoplasm of breast: Secondary | ICD-10-CM | POA: Diagnosis not present

## 2015-06-17 DIAGNOSIS — Z1211 Encounter for screening for malignant neoplasm of colon: Secondary | ICD-10-CM | POA: Diagnosis not present

## 2015-06-17 DIAGNOSIS — R059 Cough, unspecified: Secondary | ICD-10-CM

## 2015-06-17 DIAGNOSIS — Z131 Encounter for screening for diabetes mellitus: Secondary | ICD-10-CM

## 2015-06-17 DIAGNOSIS — Z124 Encounter for screening for malignant neoplasm of cervix: Secondary | ICD-10-CM

## 2015-06-17 LAB — POCT URINALYSIS DIP (MANUAL ENTRY)
Bilirubin, UA: NEGATIVE
Blood, UA: NEGATIVE
GLUCOSE UA: NEGATIVE
Ketones, POC UA: NEGATIVE
LEUKOCYTES UA: NEGATIVE
NITRITE UA: NEGATIVE
Protein Ur, POC: NEGATIVE
Spec Grav, UA: 1.02
UROBILINOGEN UA: 0.2
pH, UA: 5.5

## 2015-06-17 LAB — COMPLETE METABOLIC PANEL WITH GFR
ALT: 15 U/L (ref 6–29)
AST: 12 U/L (ref 10–35)
Albumin: 4 g/dL (ref 3.6–5.1)
Alkaline Phosphatase: 86 U/L (ref 33–130)
BILIRUBIN TOTAL: 0.2 mg/dL (ref 0.2–1.2)
BUN: 7 mg/dL (ref 7–25)
CHLORIDE: 103 mmol/L (ref 98–110)
CO2: 24 mmol/L (ref 20–31)
Calcium: 9.4 mg/dL (ref 8.6–10.4)
Creat: 0.87 mg/dL (ref 0.50–1.05)
GFR, EST AFRICAN AMERICAN: 86 mL/min (ref >=60)
GFR, EST NON AFRICAN AMERICAN: 74 mL/min (ref >=60)
Glucose, Bld: 58 mg/dL — ABNORMAL LOW (ref 65–99)
POTASSIUM: 4.2 mmol/L (ref 3.5–5.3)
Sodium: 138 mmol/L (ref 135–146)
TOTAL PROTEIN: 7.2 g/dL (ref 6.1–8.1)

## 2015-06-17 LAB — GLUCOSE, POCT (MANUAL RESULT ENTRY): POC Glucose: 68 mg/dl — AB (ref 70–99)

## 2015-06-17 LAB — LIPID PANEL
CHOL/HDL RATIO: 4.7 ratio (ref <=5.0)
CHOLESTEROL: 186 mg/dL (ref 125–200)
HDL: 40 mg/dL — ABNORMAL LOW (ref >=46)
LDL Cholesterol: 130 mg/dL — ABNORMAL HIGH (ref <130)
TRIGLYCERIDES: 78 mg/dL (ref <150)
VLDL: 16 mg/dL (ref <30)

## 2015-06-17 LAB — POCT CBC
Granulocyte percent: 76.6 %G (ref 37–80)
HCT, POC: 39.6 % (ref 37.7–47.9)
HEMOGLOBIN: 13.5 g/dL (ref 12.2–16.2)
Lymph, poc: 2.8 (ref 0.6–3.4)
MCH, POC: 25.9 pg — AB (ref 27–31.2)
MCHC: 34.1 g/dL (ref 31.8–35.4)
MCV: 76 fL — AB (ref 80–97)
MID (cbc): 0.3 (ref 0–0.9)
MPV: 6.9 fL (ref 0–99.8)
POC Granulocyte: 10.3 — AB (ref 2–6.9)
POC LYMPH %: 20.9 % (ref 10–50)
POC MID %: 2.5 %M (ref 0–12)
Platelet Count, POC: 424 10*3/uL (ref 142–424)
RBC: 5.21 M/uL (ref 4.04–5.48)
RDW, POC: 14.6 %
WBC: 13.5 10*3/uL — AB (ref 4.6–10.2)

## 2015-06-17 LAB — POCT WET + KOH PREP
TRICH BY WET PREP: ABSENT
YEAST BY WET PREP: ABSENT
Yeast by KOH: ABSENT

## 2015-06-17 LAB — POC MICROSCOPIC URINALYSIS (UMFC): MUCUS RE: ABSENT

## 2015-06-17 LAB — TSH: TSH: 0.62 mIU/L

## 2015-06-17 LAB — POCT INFLUENZA A/B
Influenza A, POC: NEGATIVE
Influenza B, POC: NEGATIVE

## 2015-06-17 LAB — HEPATITIS C ANTIBODY: HCV AB: NEGATIVE

## 2015-06-17 LAB — HIV ANTIBODY (ROUTINE TESTING W REFLEX): HIV 1&2 Ab, 4th Generation: NONREACTIVE

## 2015-06-17 IMAGING — CR DG CHEST 2V
2 series · 2 of 2 positions shown · non-contrast
Comparison: None.

CLINICAL DATA: Cough.

EXAM:
CHEST  2 VIEW

[PA]
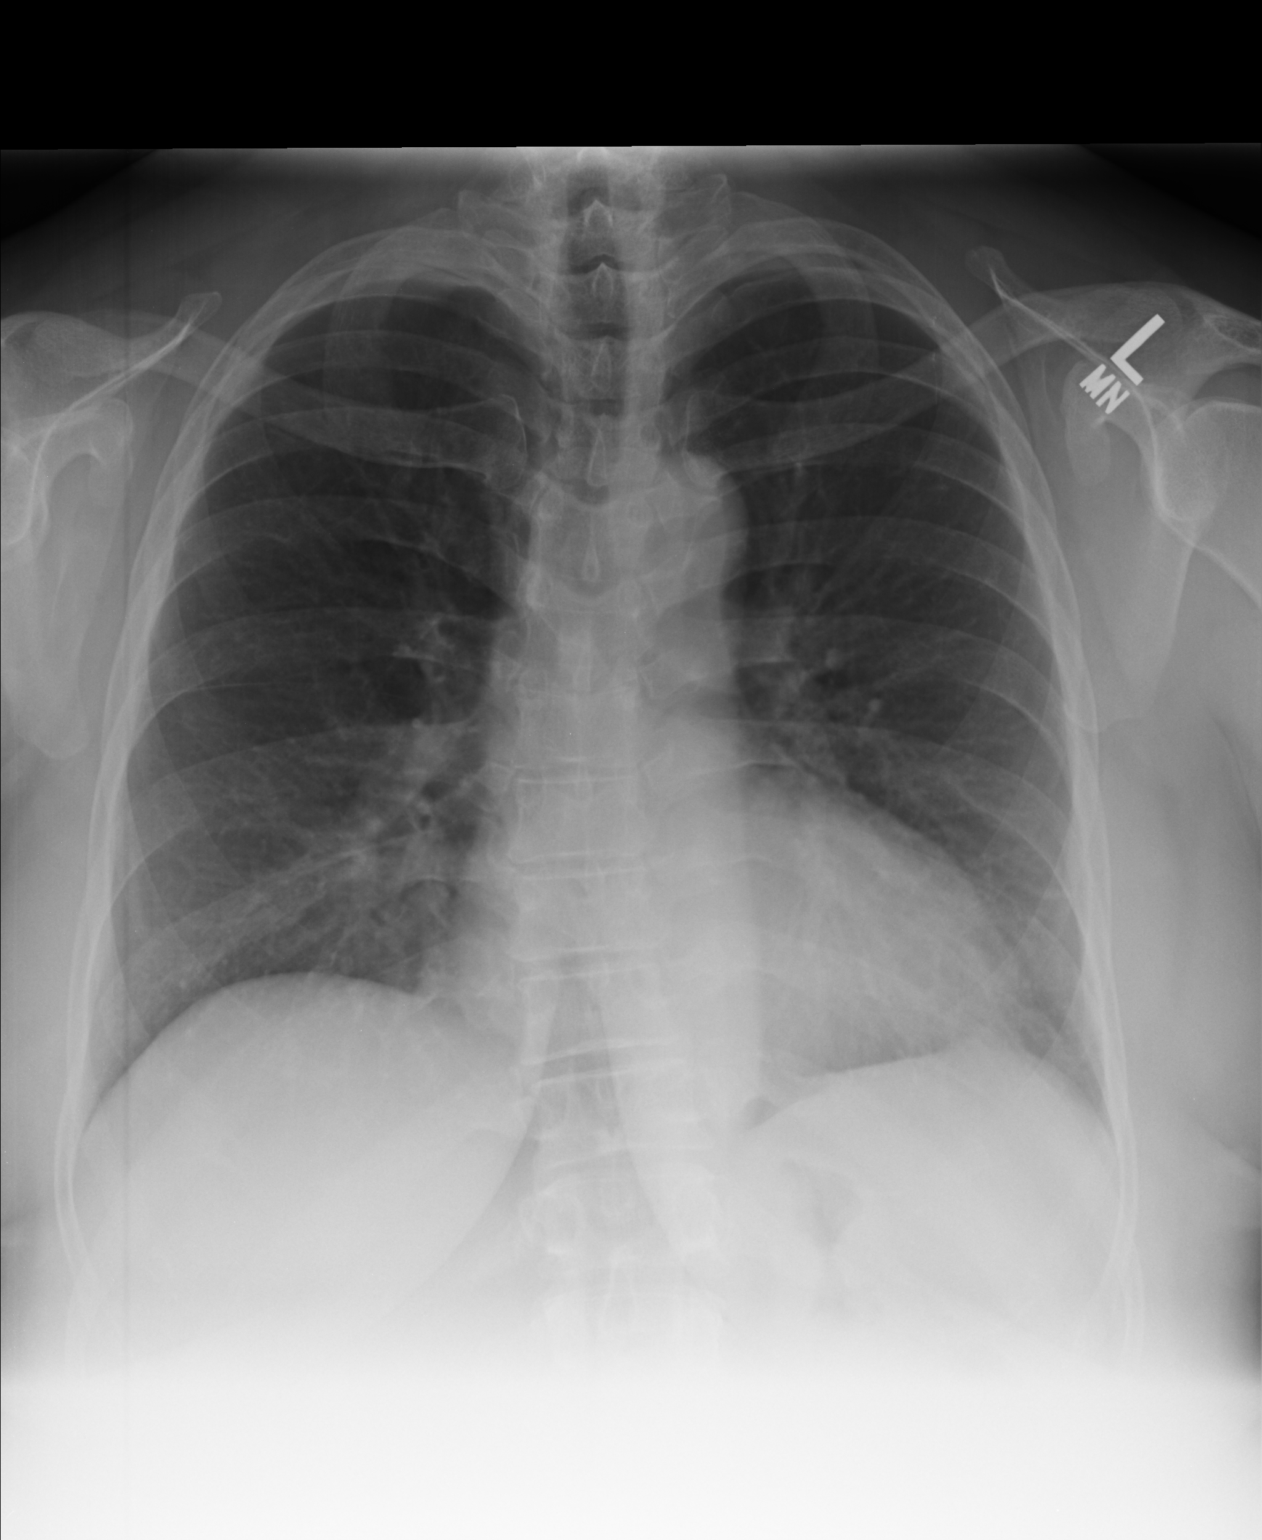

[lateral]
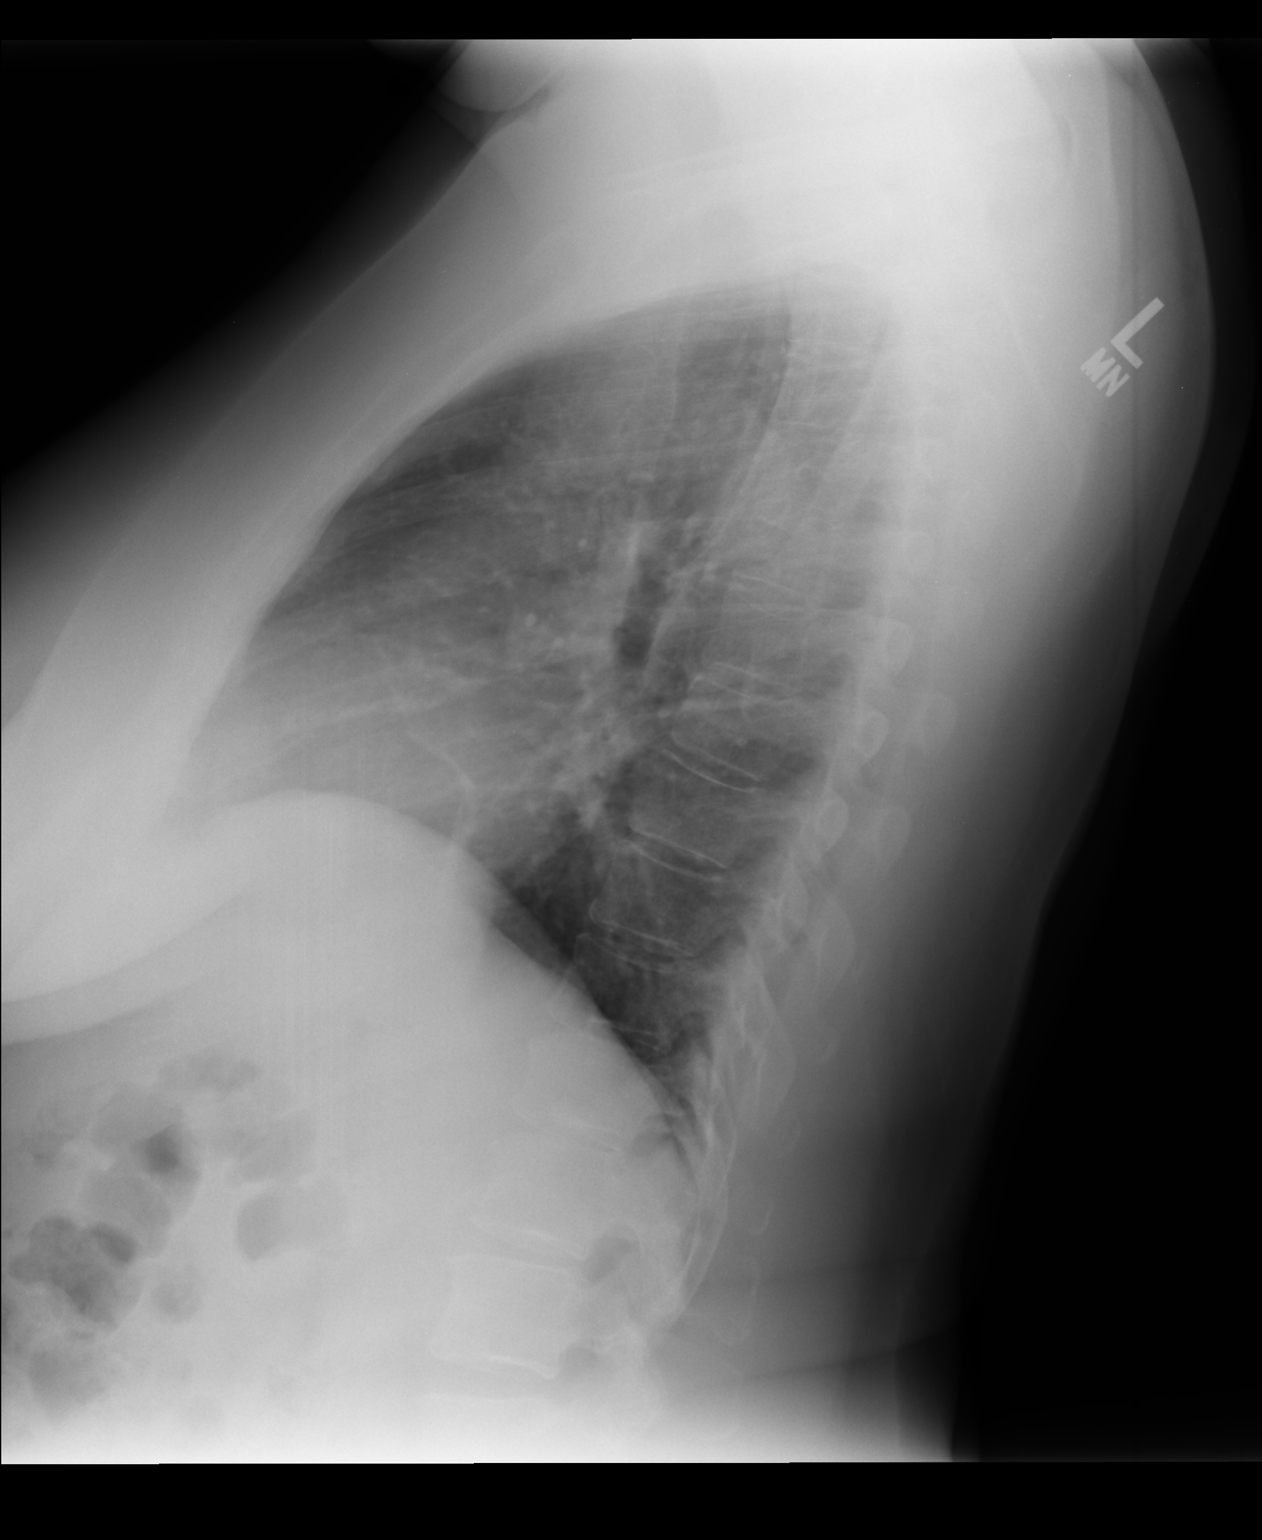

[2 of 2 positions shown; findings below may reference images not displayed]

FINDINGS: No pneumothorax. The heart size borderline. The hila and mediastinum
are normal. Mild opacity in left lung base suggestive of
atelectasis. Early infiltrate considered less likely. No pulmonary
nodules or masses.
IMPRESSION: Minimal opacity in left lung base favored to represent atelectasis
rather than early infiltrate. Recommend follow-up as clinically
warranted.

## 2015-06-17 MED ORDER — AZITHROMYCIN 250 MG PO TABS: ORAL_TABLET | ORAL | Status: DC
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
## 2015-06-17 NOTE — Patient Instructions (Signed)
Community-Acquired Pneumonia, Adult Pneumonia is an infection of the lungs. There are different types of pneumonia. One type can develop while a person is in a hospital. A different type, called community-acquired pneumonia, develops in people who are not, or have not recently been, in the hospital or other health care facility.  CAUSES Pneumonia may be caused by bacteria, viruses, or funguses. Community-acquired pneumonia is often caused by Streptococcus pneumonia bacteria. These bacteria are often passed from one person to another by breathing in droplets from the cough or sneeze of an infected person. RISK FACTORS The condition is more likely to develop in:  People who havechronic diseases, such as chronic obstructive pulmonary disease (COPD), asthma, congestive heart failure, cystic fibrosis, diabetes, or kidney disease.  People who haveearly-stage or late-stage HIV.  People who havesickle cell disease.  People who havehad their spleen removed (splenectomy).  People who havepoor Human resources officer.  People who havemedical conditions that increase the risk of breathing in (aspirating) secretions their own mouth and nose.   People who havea weakened immune system (immunocompromised).  People who smoke.  People whotravel to areas where pneumonia-causing germs commonly exist.  People whoare around animal habitats or animals that have pneumonia-causing germs, including birds, bats, rabbits, cats, and farm animals. SYMPTOMS Symptoms of this condition include:  Adry cough.  A wet (productive) cough.  Fever.  Sweating.  Chest pain, especially when breathing deeply or coughing.  Rapid breathing or difficulty breathing.  Shortness of breath.  Shaking chills.  Fatigue.  Muscle aches. DIAGNOSIS Your health care provider will take a medical history and perform a physical exam. You may also have other tests, including:  Imaging studies of your chest, including  X-rays.  Tests to check your blood oxygen level and other blood gases.  Other tests on blood, mucus (sputum), fluid around your lungs (pleural fluid), and urine. If your pneumonia is severe, other tests may be done to identify the specific cause of your illness. TREATMENT The type of treatment that you receive depends on many factors, such as the cause of your pneumonia, the medicines you take, and other medical conditions that you have. For most adults, treatment and recovery from pneumonia may occur at home. In some cases, treatment must happen in a hospital. Treatment may include:  Antibiotic medicines, if the pneumonia was caused by bacteria.  Antiviral medicines, if the pneumonia was caused by a virus.  Medicines that are given by mouth or through an IV tube.  Oxygen.  Respiratory therapy. Although rare, treating severe pneumonia may include:  Mechanical ventilation. This is done if you are not breathing well on your own and you cannot maintain a safe blood oxygen level.  Thoracentesis. This procedureremoves fluid around one lung or both lungs to help you breathe better. HOME CARE INSTRUCTIONS  Take over-the-counter and prescription medicines only as told by your health care provider.  Only takecough medicine if you are losing sleep. Understand that cough medicine can prevent your body's natural ability to remove mucus from your lungs.  If you were prescribed an antibiotic medicine, take it as told by your health care provider. Do not stop taking the antibiotic even if you start to feel better.  Sleep in a semi-upright position at night. Try sleeping in a reclining chair, or place a few pillows under your head.  Do not use tobacco products, including cigarettes, chewing tobacco, and e-cigarettes. If you need help quitting, ask your health care provider.  Drink enough water to keep your urine  clear or pale yellow. This will help to thin out mucus secretions in your  lungs. PREVENTION There are ways that you can decrease your risk of developing community-acquired pneumonia. Consider getting a pneumococcal vaccine if:  You are older than 58 years of age.  You are older than 58 years of age and are undergoing cancer treatment, have chronic lung disease, or have other medical conditions that affect your immune system. Ask your health care provider if this applies to you. There are different types and schedules of pneumococcal vaccines. Ask your health care provider which vaccination option is best for you. You may also prevent community-acquired pneumonia if you take these actions:  Get an influenza vaccine every year. Ask your health care provider which type of influenza vaccine is best for you.  Go to the dentist on a regular basis.  Wash your hands often. Use hand sanitizer if soap and water are not available. SEEK MEDICAL CARE IF:  You have a fever.  You are losing sleep because you cannot control your cough with cough medicine. SEEK IMMEDIATE MEDICAL CARE IF:  You have worsening shortness of breath.  You have increased chest pain.  Your sickness becomes worse, especially if you are an older adult or have a weakened immune system.  You cough up blood.   This information is not intended to replace advice given to you by your health care provider. Make sure you discuss any questions you have with your health care provider.   Document Released: 04/08/2005 Document Revised: 12/28/2014 Document Reviewed: 08/03/2014 Elsevier Interactive Patient Education [employer]. Menopause is a normal process in which your reproductive ability comes to an end. This process happens gradually over a span of months to years, usually between the ages of 11 and 89. Menopause is complete when you have missed 12 consecutive menstrual periods. It is important to talk with your health care provider about some of the most common conditions that affect  postmenopausal women, such as heart disease, cancer, and bone loss (osteoporosis). Adopting a healthy lifestyle and getting preventive care can help to promote your health and wellness. Those actions can also lower your chances of developing some of these common conditions. WHAT SHOULD I KNOW ABOUT MENOPAUSE? During menopause, you may experience a number of symptoms, such as:  Moderate-to-severe hot flashes.  Night sweats.  Decrease in sex drive.  Mood swings.  Headaches.  Tiredness.  Irritability.  Memory problems.  Insomnia. Choosing to treat or not to treat menopausal changes is an individual decision that you make with your health care provider. WHAT SHOULD I KNOW ABOUT HORMONE REPLACEMENT THERAPY AND SUPPLEMENTS? Hormone therapy products are effective for treating symptoms that are associated with menopause, such as hot flashes and night sweats. Hormone replacement carries certain risks, especially as you become older. If you are thinking about using estrogen or estrogen with progestin treatments, discuss the benefits and risks with your health care provider. WHAT SHOULD I KNOW ABOUT HEART DISEASE AND STROKE? Heart disease, heart attack, and stroke become more likely as you age. This may be due, in part, to the hormonal changes that your body experiences during menopause. These can affect how your body processes dietary fats, triglycerides, and cholesterol. Heart attack and stroke are both medical emergencies. There are many things that you can do to help prevent heart disease and stroke:  Have your blood pressure checked at least every 1-2 years. High blood pressure causes heart disease and increases the risk of stroke.  If you are 49-50 years old, ask your health care provider if you should take aspirin to prevent a heart attack or a stroke.  Do not use any tobacco products, including cigarettes, chewing tobacco, or electronic cigarettes. If you need help quitting, ask your  health care provider.  It is important to eat a healthy diet and maintain a healthy weight.  Be sure to include plenty of vegetables, fruits, low-fat dairy products, and lean protein.  Avoid eating foods that are high in solid fats, added sugars, or salt (sodium).  Get regular exercise. This is one of the most important things that you can do for your health.  Try to exercise for at least 150 minutes each week. The type of exercise that you do should increase your heart rate and make you sweat. This is known as moderate-intensity exercise.  Try to do strengthening exercises at least twice each week. Do these in addition to the moderate-intensity exercise.  Know your numbers.Ask your health care provider to check your cholesterol and your blood glucose. Continue to have your blood tested as directed by your health care provider. WHAT SHOULD I KNOW ABOUT CANCER SCREENING? There are several types of cancer. Take the following steps to reduce your risk and to catch any cancer development as early as possible. Breast Cancer  Practice breast self-awareness.  This means understanding how your breasts normally appear and feel.  It also means doing regular breast self-exams. Let your health care provider know about any changes, no matter how small.  If you are 5 or older, have a clinician do a breast exam (clinical breast exam or CBE) every year. Depending on your age, family history, and medical history, it may be recommended that you also have a yearly breast X-ray (mammogram).  If you have a family history of breast cancer, talk with your health care provider about genetic screening.  If you are at high risk for breast cancer, talk with your health care provider about having an MRI and a mammogram every year.  Breast cancer (BRCA) gene test is recommended for women who have family members with BRCA-related cancers. Results of the assessment will determine the need for genetic counseling  and BRCA1 and for BRCA2 testing. BRCA-related cancers include these types:  Breast. This occurs in males or females.  Ovarian.  Tubal. This may also be called fallopian tube cancer.  Cancer of the abdominal or pelvic lining (peritoneal cancer).  Prostate.  Pancreatic. Cervical, Uterine, and Ovarian Cancer Your health care provider may recommend that you be screened regularly for cancer of the pelvic organs. These include your ovaries, uterus, and vagina. This screening involves a pelvic exam, which includes checking for microscopic changes to the surface of your cervix (Pap test).  For women ages 21-65, health care providers may recommend a pelvic exam and a Pap test every three years. For women ages 72-65, they may recommend the Pap test and pelvic exam, combined with testing for human papilloma virus (HPV), every five years. Some types of HPV increase your risk of cervical cancer. Testing for HPV may also be done on women of any age who have unclear Pap test results.  Other health care providers may not recommend any screening for nonpregnant women who are considered low risk for pelvic cancer and have no symptoms. Ask your health care provider if a screening pelvic exam is right for you.  If you have had past treatment for cervical cancer or a condition that could lead to  cancer, you need Pap tests and screening for cancer for at least 20 years after your treatment. If Pap tests have been discontinued for you, your risk factors (such as having a new sexual partner) need to be reassessed to determine if you should start having screenings again. Some women have medical problems that increase the chance of getting cervical cancer. In these cases, your health care provider may recommend that you have screening and Pap tests more often.  If you have a family history of uterine cancer or ovarian cancer, talk with your health care provider about genetic screening.  If you have vaginal bleeding  after reaching menopause, tell your health care provider.  There are currently no reliable tests available to screen for ovarian cancer. Lung Cancer Lung cancer screening is recommended for adults 34-24 years old who are at high risk for lung cancer because of a history of smoking. A yearly low-dose CT scan of the lungs is recommended if you:  Currently smoke.  Have a history of at least 30 pack-years of smoking and you currently smoke or have quit within the past 15 years. A pack-year is smoking an average of one pack of cigarettes per day for one year. Yearly screening should:  Continue until it has been 15 years since you quit.  Stop if you develop a health problem that would prevent you from having lung cancer treatment. Colorectal Cancer  This type of cancer can be detected and can often be prevented.  Routine colorectal cancer screening usually begins at age 96 and continues through age 33.  If you have risk factors for colon cancer, your health care provider may recommend that you be screened at an earlier age.  If you have a family history of colorectal cancer, talk with your health care provider about genetic screening.  Your health care provider may also recommend using home test kits to check for hidden blood in your stool.  A small camera at the end of a tube can be used to examine your colon directly (sigmoidoscopy or colonoscopy). This is done to check for the earliest forms of colorectal cancer.  Direct examination of the colon should be repeated every 5-10 years until age 57. However, if early forms of precancerous polyps or small growths are found or if you have a family history or genetic risk for colorectal cancer, you may need to be screened more often. Skin Cancer  Check your skin from head to toe regularly.  Monitor any moles. Be sure to tell your health care provider:  About any new moles or changes in moles, especially if there is a change in a mole's shape  or color.  If you have a mole that is larger than the size of a pencil eraser.  If any of your family members has a history of skin cancer, especially at a young age, talk with your health care provider about genetic screening.  Always use sunscreen. Apply sunscreen liberally and repeatedly throughout the day.  Whenever you are outside, protect yourself by wearing long sleeves, pants, a wide-brimmed hat, and sunglasses. WHAT SHOULD I KNOW ABOUT OSTEOPOROSIS? Osteoporosis is a condition in which bone destruction happens more quickly than new bone creation. After menopause, you may be at an increased risk for osteoporosis. To help prevent osteoporosis or the bone fractures that can happen because of osteoporosis, the following is recommended:  If you are 44-25 years old, get at least 1,000 mg of calcium and at least 600 mg of  vitamin D per day.  If you are older than age 80 but younger than age 65, get at least 1,200 mg of calcium and at least 600 mg of vitamin D per day.  If you are older than age 52, get at least 1,200 mg of calcium and at least 800 mg of vitamin D per day. Smoking and excessive alcohol intake increase the risk of osteoporosis. Eat foods that are rich in calcium and vitamin D, and do weight-bearing exercises several times each week as directed by your health care provider. WHAT SHOULD I KNOW ABOUT HOW MENOPAUSE AFFECTS [place]? Depression may occur at any age, but it is more common as you become older. Common symptoms of depression include:  Low or sad mood.  Changes in sleep patterns.  Changes in appetite or eating patterns.  Feeling an overall lack of motivation or enjoyment of activities that you previously enjoyed.  Frequent crying spells. Talk with your health care provider if you think that you are experiencing depression. WHAT SHOULD I KNOW ABOUT IMMUNIZATIONS? It is important that you get and maintain your immunizations. These include:  Tetanus,  diphtheria, and pertussis (Tdap) booster vaccine.  Influenza every year before the flu season begins.  Pneumonia vaccine.  Shingles vaccine. Your health care provider may also recommend other immunizations.   This information is not intended to replace advice given to you by your health care provider. Make sure you discuss any questions you have with your health care provider.   Document Released: 05/31/2005 Document Revised: 04/29/2014 Document Reviewed: 12/09/2013 Elsevier Interactive Patient Education [employer].

## 2015-06-20 LAB — PAP IG (IMAGE GUIDED)

## 2015-06-21 ENCOUNTER — Encounter: Payer: Self-pay | Admitting: *Deleted

## 2015-06-21 ENCOUNTER — Other Ambulatory Visit: Payer: Self-pay

## 2015-06-21 DIAGNOSIS — G4700 Insomnia, unspecified: Secondary | ICD-10-CM

## 2015-06-28 ENCOUNTER — Institutional Professional Consult (permissible substitution): Payer: BC Managed Care – PPO | Admitting: Neurology

## 2015-06-28 ENCOUNTER — Ambulatory Visit (INDEPENDENT_AMBULATORY_CARE_PROVIDER_SITE_OTHER): Payer: BC Managed Care – PPO | Admitting: Neurology

## 2015-06-28 ENCOUNTER — Encounter: Payer: Self-pay | Admitting: Neurology

## 2015-06-28 VITALS — BP 120/68 | HR 72 | Resp 16 | Ht 64.0 in | Wt 191.0 lb

## 2015-06-28 DIAGNOSIS — G478 Other sleep disorders: Secondary | ICD-10-CM | POA: Diagnosis not present

## 2015-06-28 DIAGNOSIS — R351 Nocturia: Secondary | ICD-10-CM

## 2015-06-28 DIAGNOSIS — E669 Obesity, unspecified: Secondary | ICD-10-CM

## 2015-06-28 DIAGNOSIS — R0683 Snoring: Secondary | ICD-10-CM

## 2015-06-28 DIAGNOSIS — G4719 Other hypersomnia: Secondary | ICD-10-CM | POA: Diagnosis not present

## 2015-06-28 NOTE — Patient Instructions (Signed)
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
## 2015-07-30 ENCOUNTER — Ambulatory Visit (INDEPENDENT_AMBULATORY_CARE_PROVIDER_SITE_OTHER): Payer: BC Managed Care – PPO | Admitting: Neurology

## 2015-07-30 DIAGNOSIS — G4761 Periodic limb movement disorder: Secondary | ICD-10-CM | POA: Diagnosis not present

## 2015-07-30 DIAGNOSIS — G479 Sleep disorder, unspecified: Secondary | ICD-10-CM

## 2015-07-30 DIAGNOSIS — R0683 Snoring: Secondary | ICD-10-CM

## 2015-07-30 DIAGNOSIS — G4720 Circadian rhythm sleep disorder, unspecified type: Secondary | ICD-10-CM

## 2015-07-31 NOTE — Sleep Study (Signed)
Please see the scanned sleep study interpretation located in the Procedure tab within the Chart Review section. 

## 2015-08-03 ENCOUNTER — Ambulatory Visit
Admission: RE | Admit: 2015-08-03 | Discharge: 2015-08-03 | Disposition: A | Discharge status: Home / Self Care | Payer: BC Managed Care – PPO | Source: Ambulatory Visit | Attending: Emergency Medicine | Admitting: Emergency Medicine

## 2015-08-03 DIAGNOSIS — Z1239 Encounter for other screening for malignant neoplasm of breast: Secondary | ICD-10-CM

## 2015-08-09 ENCOUNTER — Telehealth: Payer: Self-pay | Admitting: Neurology

## 2015-08-09 ENCOUNTER — Other Ambulatory Visit: Payer: Self-pay

## 2015-08-09 DIAGNOSIS — R351 Nocturia: Secondary | ICD-10-CM

## 2015-08-09 DIAGNOSIS — R0683 Snoring: Secondary | ICD-10-CM

## 2015-08-09 DIAGNOSIS — G4719 Other hypersomnia: Secondary | ICD-10-CM

## 2015-08-09 DIAGNOSIS — E669 Obesity, unspecified: Secondary | ICD-10-CM

## 2015-08-09 DIAGNOSIS — G478 Other sleep disorders: Secondary | ICD-10-CM

## 2015-08-09 NOTE — Telephone Encounter (Signed)
I spoke to patient and she is aware of results and recommendations. She is unable to make f/u at this time for financial reasons. She will call back if needed. I advised her that we will also send a copy to her PCP.

## 2015-08-09 NOTE — Telephone Encounter (Signed)
Patient referred by Dr. Tamala Julian, seen by me on 06/28/15, diagnostic PSG on 07/30/15.   Please call and notify the patient that the recent sleep study did not show any significant obstructive sleep apnea, but she did snore. She had mild led twitching in sleep, called PLMs. Please inform patient that I would like to go over the details of the study during a follow up appointment. Arrange a followup appointment. Also, route or fax report to PCP and referring MD, if other than PCP.  Once you have spoken to patient, you can close this encounter.   Thanks,  Star Age, MD, PhD [facility])

## 2015-11-01 ENCOUNTER — Encounter (HOSPITAL_COMMUNITY): Payer: Self-pay

## 2015-11-01 ENCOUNTER — Emergency Department (HOSPITAL_COMMUNITY)
Admission: EM | Admit: 2015-11-01 | Discharge: 2015-11-01 | Disposition: A | Discharge status: Home / Self Care | Payer: BC Managed Care – PPO | Attending: Dermatology | Admitting: Dermatology

## 2015-11-01 DIAGNOSIS — R51 Headache: Secondary | ICD-10-CM | POA: Diagnosis not present

## 2015-11-01 DIAGNOSIS — Z87891 Personal history of nicotine dependence: Secondary | ICD-10-CM | POA: Diagnosis not present

## 2015-11-01 DIAGNOSIS — Z5321 Procedure and treatment not carried out due to patient leaving prior to being seen by health care provider: Secondary | ICD-10-CM | POA: Diagnosis not present

## 2015-11-01 NOTE — ED Notes (Signed)
Patient complains of frontal headache x 3 days, denies trauma. No nausea, denies blurred vision. Alert and oriented.  No other associated symptoms. NAD

## 2015-11-01 NOTE — ED Notes (Signed)
Pt stated that she had to go but pt was encouraged to stay. Pt stated that she was was leaving.

## 2015-12-27 ENCOUNTER — Encounter: Payer: Self-pay | Admitting: Family Medicine

## 2015-12-27 ENCOUNTER — Ambulatory Visit (INDEPENDENT_AMBULATORY_CARE_PROVIDER_SITE_OTHER): Payer: BC Managed Care – PPO | Admitting: Family Medicine

## 2015-12-27 ENCOUNTER — Telehealth: Payer: Self-pay | Admitting: *Deleted

## 2015-12-27 ENCOUNTER — Ambulatory Visit (INDEPENDENT_AMBULATORY_CARE_PROVIDER_SITE_OTHER): Payer: BC Managed Care – PPO

## 2015-12-27 VITALS — BP 126/82 | HR 70 | Temp 97.5°F | Resp 18 | Ht 64.0 in | Wt 196.0 lb

## 2015-12-27 DIAGNOSIS — R1084 Generalized abdominal pain: Secondary | ICD-10-CM | POA: Diagnosis not present

## 2015-12-27 DIAGNOSIS — S39011A Strain of muscle, fascia and tendon of abdomen, initial encounter: Secondary | ICD-10-CM

## 2015-12-27 LAB — POCT CBC
Granulocyte percent: 64.5 %G (ref 37–80)
HEMATOCRIT: 39.6 % (ref 37.7–47.9)
Hemoglobin: 13.1 g/dL (ref 12.2–16.2)
LYMPH, POC: 3 (ref 0.6–3.4)
MCH, POC: 25.1 pg — AB (ref 27–31.2)
MCHC: 33.2 g/dL (ref 31.8–35.4)
MCV: 75.6 fL — AB (ref 80–97)
MID (cbc): 0.8 (ref 0–0.9)
MPV: 7.1 fL (ref 0–99.8)
POC GRANULOCYTE: 6.9 (ref 2–6.9)
POC LYMPH %: 28.3 % (ref 10–50)
POC MID %: 7.2 %M (ref 0–12)
Platelet Count, POC: 378 10*3/uL (ref 142–424)
RBC: 5.24 M/uL (ref 4.04–5.48)
RDW, POC: 16.4 %
WBC: 10.7 10*3/uL — AB (ref 4.6–10.2)

## 2015-12-27 LAB — POC MICROSCOPIC URINALYSIS (UMFC): MUCUS RE: ABSENT

## 2015-12-27 LAB — POCT URINALYSIS DIP (MANUAL ENTRY)
Bilirubin, UA: NEGATIVE
Blood, UA: NEGATIVE
Glucose, UA: NEGATIVE
Ketones, POC UA: NEGATIVE
LEUKOCYTES UA: NEGATIVE
NITRITE UA: NEGATIVE
PROTEIN UA: NEGATIVE
Spec Grav, UA: 1.015
Urobilinogen, UA: 0.2
pH, UA: 5.5

## 2015-12-27 LAB — POCT WET + KOH PREP
Trich by wet prep: ABSENT
YEAST BY KOH: ABSENT
YEAST BY WET PREP: ABSENT

## 2015-12-27 MED ORDER — TIZANIDINE HCL 2 MG PO CAPS: 2.0000 mg | ORAL_CAPSULE | Freq: Three times a day (TID) | ORAL | 0 refills | Status: DC | PRN
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
## 2015-12-27 NOTE — Telephone Encounter (Signed)
Called flexeril in pharmacy per Dr. Tamala Julian

## 2015-12-27 NOTE — Patient Instructions (Signed)
     IF you received an x-ray today, you will receive an invoice from [facility]. Please contact [facility] at [phone] with questions or concerns regarding your invoice.   IF you received labwork today, you will receive an invoice from [facility]. Please contact [facility] at [phone] with questions or concerns regarding your invoice.   Our billing staff will not be able to assist you with questions regarding bills from these companies.  You will be contacted with the lab results as soon as they are available. The fastest way to get your results is to activate your My Chart account. Instructions are located on the last page of this paperwork. If you have not heard from us regarding the results in 2 weeks, please contact this office.      

## 2015-12-28 LAB — COMPREHENSIVE METABOLIC PANEL
ALT: 19 U/L (ref 6–29)
AST: 15 U/L (ref 10–35)
Albumin: 4.1 g/dL (ref 3.6–5.1)
Alkaline Phosphatase: 83 U/L (ref 33–130)
BILIRUBIN TOTAL: 0.2 mg/dL (ref 0.2–1.2)
BUN: 12 mg/dL (ref 7–25)
CHLORIDE: 106 mmol/L (ref 98–110)
CO2: 24 mmol/L (ref 20–31)
CREATININE: 0.74 mg/dL (ref 0.50–1.05)
Calcium: 9.7 mg/dL (ref 8.6–10.4)
GLUCOSE: 86 mg/dL (ref 65–99)
Potassium: 4.1 mmol/L (ref 3.5–5.3)
SODIUM: 138 mmol/L (ref 135–146)
Total Protein: 7.2 g/dL (ref 6.1–8.1)

## 2016-01-22 ENCOUNTER — Other Ambulatory Visit: Payer: Self-pay | Admitting: Family Medicine

## 2016-01-22 DIAGNOSIS — R232 Flushing: Secondary | ICD-10-CM

## 2016-01-23 NOTE — Telephone Encounter (Signed)
05/2015 last annual exam and labs and pap

## 2016-01-24 ENCOUNTER — Other Ambulatory Visit: Payer: Self-pay | Admitting: Family Medicine

## 2016-01-24 DIAGNOSIS — R232 Flushing: Secondary | ICD-10-CM

## 2016-03-08 DIAGNOSIS — Z0271 Encounter for disability determination: Secondary | ICD-10-CM

## 2016-03-11 ENCOUNTER — Telehealth: Payer: Self-pay

## 2016-03-11 NOTE — Telephone Encounter (Signed)
Patient needs Attending physician statement completed by Dr Tamala Julian for her Short term disability claim. I am not sure what she is filing it for she did not tell me. I have completed what I could from the [place] notes and highlighted the areas that need to be completed. I will place the forms in your box on 03/11/16 if you could please return them to the FMLA/Disability box at the [address] checkout desk within 5-7 business days. Thank you!

## 2016-03-18 ENCOUNTER — Telehealth: Payer: Self-pay

## 2016-03-18 NOTE — Telephone Encounter (Signed)
Pt is calling stating she is in need of her FMLA/Disability forms they are supposed to be submitted by tomorrow she said it has been over a week and really needs to pick them up   Please advise [phone]

## 2016-03-19 NOTE — Telephone Encounter (Signed)
Patient has been calling the past two days needing these forms, states they are to be turned into her insurance company by the end of the week. Have we completed them yet? Please let me know thank you!

## 2016-03-19 NOTE — Telephone Encounter (Signed)
Pt calling about her FMLA paper work that she dropped off two weeks ago paper need to be sent to insurance office by Dec 1st

## 2016-03-20 NOTE — Telephone Encounter (Signed)
Spoke with patient; needs short term disability paperwork completed for 12/27/15 visit; out of work note provided for 9/6-9/10; return to work 9/11 regular duty.  Form ready for pick up; pt plans to pick up on 03/21/16.  Form placed on disability desk.

## 2016-03-21 NOTE — Telephone Encounter (Signed)
Paperwork scanned and faxed to company on 03/21/16 patient stated she would come pick up a copy.

## 2016-05-03 ENCOUNTER — Ambulatory Visit (INDEPENDENT_AMBULATORY_CARE_PROVIDER_SITE_OTHER): Payer: BC Managed Care – PPO | Admitting: Physician Assistant

## 2016-05-03 VITALS — BP 140/88 | HR 62 | Temp 97.6°F | Resp 18 | Ht 63.5 in | Wt 190.8 lb

## 2016-05-03 DIAGNOSIS — R232 Flushing: Secondary | ICD-10-CM | POA: Diagnosis not present

## 2016-05-03 DIAGNOSIS — K529 Noninfective gastroenteritis and colitis, unspecified: Secondary | ICD-10-CM

## 2016-05-03 DIAGNOSIS — R102 Pelvic and perineal pain: Secondary | ICD-10-CM | POA: Diagnosis not present

## 2016-05-03 MED ORDER — ESTRADIOL 0.5 MG PO TABS: 0.5000 mg | ORAL_TABLET | Freq: Every day | ORAL | 0 refills | Status: DC

## 2016-05-03 MED ORDER — ONDANSETRON 8 MG PO TBDP: 8.0000 mg | ORAL_TABLET | Freq: Three times a day (TID) | ORAL | 0 refills | Status: DC | PRN

## 2016-05-03 MED ORDER — TRAZODONE HCL 50 MG PO TABS: 25.0000 mg | ORAL_TABLET | Freq: Every day | ORAL | 5 refills | Status: DC

## 2016-05-03 NOTE — Patient Instructions (Addendum)
Make sure that you continue to hydrate.  You can add a little gatorade to the water to add electrolytes. Please use the zofran for nausea as needed.   I will contact you, or an assistant, in regards to the next step for your abdominal pain.   Food Choices to Help Relieve Diarrhea, Adult When you have diarrhea, the foods you eat and your eating habits are very important. Choosing the right foods and drinks can help relieve diarrhea. Also, because diarrhea can last up to 7 days, you need to replace lost fluids and electrolytes (such as sodium, potassium, and chloride) in order to help prevent dehydration. What general guidelines do I need to follow?  Slowly drink 1 cup (8 oz) of fluid for each episode of diarrhea. If you are getting enough fluid, your urine will be clear or pale yellow.  Eat starchy foods. Some good choices include white rice, white toast, pasta, low-fiber cereal, baked potatoes (without the skin), saltine crackers, and bagels.  Avoid large servings of any cooked vegetables.  Limit fruit to two servings per day. A serving is  cup or 1 small piece.  Choose foods with less than 2 g of fiber per serving.  Limit fats to less than 8 tsp (38 g) per day.  Avoid fried foods.  Eat foods that have probiotics in them. Probiotics can be found in certain dairy products.  Avoid foods and beverages that may increase the speed at which food moves through the stomach and intestines (gastrointestinal tract). Things to avoid include:  High-fiber foods, such as dried fruit, raw fruits and vegetables, nuts, seeds, and whole grain foods.  Spicy foods and high-fat foods.  Foods and beverages sweetened with high-fructose corn syrup, honey, or sugar alcohols such as xylitol, sorbitol, and mannitol. What foods are recommended? Grains  White rice. White, [place], or pita breads (fresh or toasted), including plain rolls, buns, or bagels. White pasta. Saltine, soda, or graham crackers. Pretzels.  Low-fiber cereal. Cooked cereals made with water (such as cornmeal, farina, or cream cereals). Plain muffins. Matzo. Melba toast. Zwieback. Vegetables  Potatoes (without the skin). Strained tomato and vegetable juices. Most well-cooked and canned vegetables without seeds. Tender lettuce. Fruits  Cooked or canned applesauce, apricots, cherries, fruit cocktail, grapefruit, peaches, pears, or plums. Fresh bananas, apples without skin, cherries, grapes, cantaloupe, grapefruit, peaches, oranges, or plums. Meat and Other Protein Products  Baked or boiled chicken. Eggs. Tofu. Fish. Seafood. Smooth peanut butter. Ground or well-cooked tender beef, ham, veal, lamb, pork, or poultry. Dairy  Plain yogurt, kefir, and unsweetened liquid yogurt. Lactose-free milk, buttermilk, or soy milk. Plain hard cheese. Beverages  Sport drinks. Clear broths. Diluted fruit juices (except prune). Regular, caffeine-free sodas such as ginger ale. Water. Decaffeinated teas. Oral rehydration solutions. Sugar-free beverages not sweetened with sugar alcohols. Other  Bouillon, broth, or soups made from recommended foods. The items listed above may not be a complete list of recommended foods or beverages. Contact your dietitian for more options.  What foods are not recommended? Grains  Whole grain, whole wheat, bran, or rye breads, rolls, pastas, crackers, and cereals. Wild or brown rice. Cereals that contain more than 2 g of fiber per serving. Corn tortillas or taco shells. Cooked or dry oatmeal. Granola. Popcorn. Vegetables  Raw vegetables. Cabbage, broccoli, Brussels sprouts, artichokes, baked beans, beet greens, corn, kale, legumes, peas, sweet potatoes, and yams. Potato skins. Cooked spinach and cabbage. Fruits  Dried fruit, including raisins and dates. Raw fruits. Stewed or dried  prunes. Fresh apples with skin, apricots, mangoes, pears, raspberries, and strawberries. Meat and Other Protein Products  Chunky peanut butter.  Nuts and seeds. Beans and lentils. Berniece Salines. Dairy  High-fat cheeses. Milk, chocolate milk, and beverages made with milk, such as milk shakes. Cream. Ice cream. Sweets and Desserts  Sweet rolls, doughnuts, and sweet breads. Pancakes and waffles. Fats and Oils  Butter. Cream sauces. Margarine. Salad oils. Plain salad dressings. Olives. Avocados. Beverages  Caffeinated beverages (such as coffee, tea, soda, or energy drinks). Alcoholic beverages. Fruit juices with pulp. Prune juice. Soft drinks sweetened with high-fructose corn syrup or sugar alcohols. Other  Coconut. Hot sauce. Chili powder. Mayonnaise. Gravy. Cream-based or milk-based soups. The items listed above may not be a complete list of foods and beverages to avoid. Contact your dietitian for more information.  What should I do if I become dehydrated? Diarrhea can sometimes lead to dehydration. Signs of dehydration include dark urine and dry mouth and skin. If you think you are dehydrated, you should rehydrate with an oral rehydration solution. These solutions can be purchased at pharmacies, retail stores, or online. Drink -1 cup (120-240 mL) of oral rehydration solution each time you have an episode of diarrhea. If drinking this amount makes your diarrhea worse, try drinking smaller amounts more often. For example, drink 1-3 tsp (5-15 mL) every 5-10 minutes. A general rule for staying hydrated is to drink 1-2 L of fluid per day. Talk to your health care provider about the specific amount you should be drinking each day. Drink enough fluids to keep your urine clear or pale yellow. This information is not intended to replace advice given to you by your health care provider. Make sure you discuss any questions you have with your health care provider. Document Released: 06/29/2003 Document Revised: 09/14/2015 Document Reviewed: 03/01/2013 Elsevier Interactive Patient Education  2017 [employer].     IF you received an x-ray today, you will  receive an invoice from [facility]. Please contact [facility] at [phone] with questions or concerns regarding your invoice.   IF you received labwork today, you will receive an invoice from [facility]. Please contact [facility] at [phone] with questions or concerns regarding your invoice.   Our billing staff will not be able to assist you with questions regarding bills from these companies.  You will be contacted with the lab results as soon as they are available. The fastest way to get your results is to activate your My Chart account. Instructions are located on the last page of this paperwork. If you have not heard from [place] regarding the results in 2 weeks, please contact this office.
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
## 2016-05-06 ENCOUNTER — Telehealth: Payer: Self-pay

## 2016-05-06 DIAGNOSIS — R110 Nausea: Secondary | ICD-10-CM

## 2016-05-06 MED ORDER — ONDANSETRON HCL 8 MG PO TABS: 8.0000 mg | ORAL_TABLET | Freq: Three times a day (TID) | ORAL | 0 refills | Status: DC | PRN

## 2016-05-06 NOTE — Telephone Encounter (Signed)
zofran odt not covered, may cover pills, please change rx and send or advise if PA needed to start [phone] w [phone]

## 2016-05-06 NOTE — Telephone Encounter (Signed)
Sent zofran tablet to pharmacy

## 2016-05-10 ENCOUNTER — Telehealth: Payer: Self-pay

## 2016-05-10 NOTE — Telephone Encounter (Signed)
[employer] denied coverage for this patients ordered CT abdomen/pelvis. Please advise on what to do next.

## 2016-05-15 ENCOUNTER — Encounter: Payer: Self-pay | Admitting: Family Medicine

## 2016-05-15 ENCOUNTER — Ambulatory Visit (INDEPENDENT_AMBULATORY_CARE_PROVIDER_SITE_OTHER): Payer: BC Managed Care – PPO | Admitting: Family Medicine

## 2016-05-15 VITALS — BP 126/78 | HR 80 | Temp 98.3°F | Resp 18 | Ht 63.5 in | Wt 191.0 lb

## 2016-05-15 DIAGNOSIS — M545 Low back pain, unspecified: Secondary | ICD-10-CM

## 2016-05-15 DIAGNOSIS — Z87898 Personal history of other specified conditions: Secondary | ICD-10-CM

## 2016-05-15 DIAGNOSIS — R232 Flushing: Secondary | ICD-10-CM | POA: Diagnosis not present

## 2016-05-15 DIAGNOSIS — G8929 Other chronic pain: Secondary | ICD-10-CM

## 2016-05-15 DIAGNOSIS — Z0000 Encounter for general adult medical examination without abnormal findings: Secondary | ICD-10-CM | POA: Diagnosis not present

## 2016-05-15 DIAGNOSIS — E7800 Pure hypercholesterolemia, unspecified: Secondary | ICD-10-CM | POA: Diagnosis not present

## 2016-05-15 DIAGNOSIS — Z6833 Body mass index (BMI) 33.0-33.9, adult: Secondary | ICD-10-CM | POA: Diagnosis not present

## 2016-05-15 DIAGNOSIS — R1030 Lower abdominal pain, unspecified: Secondary | ICD-10-CM | POA: Diagnosis not present

## 2016-05-15 DIAGNOSIS — Z131 Encounter for screening for diabetes mellitus: Secondary | ICD-10-CM | POA: Diagnosis not present

## 2016-05-15 DIAGNOSIS — Z136 Encounter for screening for cardiovascular disorders: Secondary | ICD-10-CM | POA: Diagnosis not present

## 2016-05-15 DIAGNOSIS — E6609 Other obesity due to excess calories: Secondary | ICD-10-CM

## 2016-05-15 DIAGNOSIS — F5101 Primary insomnia: Secondary | ICD-10-CM | POA: Diagnosis not present

## 2016-05-15 DIAGNOSIS — F1911 Other psychoactive substance abuse, in remission: Secondary | ICD-10-CM

## 2016-05-15 LAB — POCT URINALYSIS DIP (MANUAL ENTRY)
Bilirubin, UA: NEGATIVE
Glucose, UA: NEGATIVE
Ketones, POC UA: NEGATIVE
Leukocytes, UA: NEGATIVE
Nitrite, UA: NEGATIVE
PH UA: 5
PROTEIN UA: NEGATIVE
Spec Grav, UA: 1.015
UROBILINOGEN UA: 0.2

## 2016-05-15 MED ORDER — ESTRADIOL 0.5 MG PO TABS: 0.5000 mg | ORAL_TABLET | Freq: Every day | ORAL | 4 refills | Status: DC

## 2016-05-15 NOTE — Patient Instructions (Addendum)
[email or web address]   IF you received an x-ray today, you will receive an invoice from [facility]. Please contact [facility] at [phone] with questions or concerns regarding your invoice.   IF you received labwork today, you will receive an invoice from [place] [facility]. Please contact [facility] at [phone] with questions or concerns regarding your invoice.   Our billing staff will not be able to assist you with questions regarding bills from these companies.  You will be contacted with the lab results as soon as they are available. The fastest way to get your results is to activate your My Chart account. Instructions are located on the last page of this paperwork. If you have not heard from [place] regarding the results in 2 weeks, please contact this office.    Keeping You Healthy  Get These Tests  Blood Pressure- Have your blood pressure checked by your healthcare provider at least once a year.  Normal blood pressure is 120/80.  Weight- Have your body mass index (BMI) calculated to screen for obesity.  BMI is a measure of body fat based on height and weight.  You can calculate your own BMI at [employer]velB[employer].it  Cholesterol- Have your cholesterol checked every year.  Diabetes- Have your blood sugar checked every year if you have high blood pressure, high cholesterol, a family history of diabetes or if you are overweight.  Pap Test - Have a pap test every 1 to 5 years if you have been sexually active.  If you are older than 65 and recent pap tests have been normal you may not need additional pap tests.  In addition, if you have had a hysterectomy  for benign disease additional pap tests are not necessary.  Mammogram-Yearly mammograms are essential for early detection of breast cancer  Screening for Colon Cancer- Colonoscopy starting at age 41. Screening may begin sooner depending on your family history and other health conditions.  Follow up colonoscopy as directed  by your Gastroenterologist.  Screening for Osteoporosis- Screening begins at age 4 with bone density scanning, sooner if you are at higher risk for developing Osteoporosis.  Get these medicines  Calcium with Vitamin D- Your body requires 1200-1500 mg of Calcium a day and [id number] IU of Vitamin D a day.  You can only absorb 500 mg of Calcium at a time therefore Calcium must be taken in 2 or 3 separate doses throughout the day.  Hormones- Hormone therapy has been associated with increased risk for certain cancers and heart disease.  Talk to your healthcare provider about if you need relief from menopausal symptoms.  Aspirin- Ask your healthcare provider about taking Aspirin to prevent Heart Disease and Stroke.  Get these Immuniztions  Flu shot- Every fall  Pneumonia shot- Once after the age of 11; if you are younger ask your healthcare provider if you need a pneumonia shot.  Tetanus- Every ten years.  Zostavax- Once after the age of 52 to prevent shingles.  Take these steps  Don't smoke- Your healthcare provider can help you quit. For tips on how to quit, ask your healthcare provider or go to www.smokefree.gov or call 1-800 QUIT-NOW.  Be physically active- Exercise 5 days a week for a minimum of 30 minutes.  If you are not already physically active, start slow and gradually work up to 30 minutes of moderate physical activity.  Try walking, dancing, bike riding, swimming, etc.  Eat a healthy diet- Eat a variety of healthy foods such as fruits, vegetables, whole grains, low fat  milk, low fat cheeses, yogurt, lean meats, chicken, fish, eggs, dried beans, tofu, etc.  For more information go to www.thenutritionsource.org  Dental visit- Brush and floss teeth twice daily; visit your dentist twice a year.  Eye exam- Visit your Optometrist or Ophthalmologist yearly.  Drink alcohol in moderation- Limit alcohol intake to one drink or less a day.  Never drink and drive.  Depression- Your  emotional health is as important as your physical health.  If you're feeling down or losing interest in things you normally enjoy, please talk to your healthcare provider.  Seat Belts- can save your life; always wear one  Smoke/Carbon Monoxide detectors- These detectors need to be installed on the appropriate level of your home.  Replace batteries at least once a year.  Violence- If anyone is threatening or hurting you, please tell your healthcare provider.  Living Will/ Health care power of attorney- Discuss with your healthcare provider and family.
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
## 2016-05-16 LAB — COMPREHENSIVE METABOLIC PANEL
ALBUMIN: 4.1 g/dL (ref 3.5–5.5)
ALT: 18 IU/L (ref 0–32)
AST: 17 IU/L (ref 0–40)
Albumin/Globulin Ratio: 1.2 (ref 1.2–2.2)
Alkaline Phosphatase: 101 IU/L (ref 39–117)
BUN/Creatinine Ratio: 17 (ref 9–23)
BUN: 12 mg/dL (ref 6–24)
Bilirubin Total: 0.2 mg/dL (ref 0.0–1.2)
CALCIUM: 9.7 mg/dL (ref 8.7–10.2)
CO2: 22 mmol/L (ref 18–29)
CREATININE: 0.7 mg/dL (ref 0.57–1.00)
Chloride: 101 mmol/L (ref 96–106)
GFR, EST AFRICAN AMERICAN: 110 mL/min/{1.73_m2} (ref >59)
GFR, EST NON AFRICAN AMERICAN: 96 mL/min/{1.73_m2} (ref >59)
GLUCOSE: 59 mg/dL — AB (ref 65–99)
Globulin, Total: 3.3 g/dL (ref 1.5–4.5)
Potassium: 4.8 mmol/L (ref 3.5–5.2)
Sodium: 138 mmol/L (ref 134–144)
Total Protein: 7.4 g/dL (ref 6.0–8.5)

## 2016-05-16 LAB — CBC WITH DIFFERENTIAL/PLATELET
BASOS ABS: 0 10*3/uL (ref 0.0–0.2)
Basos: 0 %
EOS (ABSOLUTE): 0.1 10*3/uL (ref 0.0–0.4)
Eos: 2 %
HEMOGLOBIN: 13.6 g/dL (ref 11.1–15.9)
Hematocrit: 42 % (ref 34.0–46.6)
IMMATURE GRANS (ABS): 0 10*3/uL (ref 0.0–0.1)
IMMATURE GRANULOCYTES: 0 %
Lymphocytes Absolute: 2.3 10*3/uL (ref 0.7–3.1)
Lymphs: 32 %
MCH: 24.7 pg — ABNORMAL LOW (ref 26.6–33.0)
MCHC: 32.4 g/dL (ref 31.5–35.7)
MCV: 76 fL — ABNORMAL LOW (ref 79–97)
MONOCYTES: 6 %
Monocytes Absolute: 0.4 10*3/uL (ref 0.1–0.9)
NEUTROS PCT: 60 %
Neutrophils Absolute: 4.4 10*3/uL (ref 1.4–7.0)
PLATELETS: 368 10*3/uL (ref 150–379)
RBC: 5.51 x10E6/uL — AB (ref 3.77–5.28)
RDW: 15.9 % — ABNORMAL HIGH (ref 12.3–15.4)
WBC: 7.2 10*3/uL (ref 3.4–10.8)

## 2016-05-16 LAB — LIPID PANEL
CHOLESTEROL TOTAL: 204 mg/dL — AB (ref 100–199)
Chol/HDL Ratio: 5.1 ratio units — ABNORMAL HIGH (ref 0.0–4.4)
HDL: 40 mg/dL (ref >39)
LDL Calculated: 144 mg/dL — ABNORMAL HIGH (ref 0–99)
TRIGLYCERIDES: 101 mg/dL (ref 0–149)
VLDL CHOLESTEROL CAL: 20 mg/dL (ref 5–40)

## 2016-05-16 LAB — TSH: TSH: 0.523 u[IU]/mL (ref 0.450–4.500)

## 2016-05-16 LAB — HEMOGLOBIN A1C
Est. average glucose Bld gHb Est-mCnc: 105 mg/dL
Hgb A1c MFr Bld: 5.3 % (ref 4.8–5.6)

## 2016-05-17 NOTE — Telephone Encounter (Signed)
Pt calling us again about her insurance not paying for CT please respond cause hospital is calling her to set up an appointment patient will just wait on calling Hospital back until she hear from someone

## 2016-05-18 NOTE — Telephone Encounter (Signed)
Great, thanks

## 2016-05-18 NOTE — Telephone Encounter (Signed)
The scan is now covered by insurance, okay to schedule. I will inform the patient.

## 2016-06-10 ENCOUNTER — Ambulatory Visit
Admission: RE | Admit: 2016-06-10 | Discharge: 2016-06-10 | Disposition: A | Discharge status: Home / Self Care | Payer: BC Managed Care – PPO | Source: Ambulatory Visit | Attending: Family Medicine | Admitting: Family Medicine

## 2016-06-10 DIAGNOSIS — R1030 Lower abdominal pain, unspecified: Secondary | ICD-10-CM

## 2016-06-10 MED ORDER — IOPAMIDOL (ISOVUE-300) INJECTION 61%
100.0000 mL | Freq: Once | INTRAVENOUS | Status: AC | PRN
  Administered 2016-06-10: 100 mL via INTRAVENOUS

## 2016-07-09 ENCOUNTER — Telehealth: Payer: Self-pay | Admitting: Family Medicine

## 2016-07-09 DIAGNOSIS — R232 Flushing: Secondary | ICD-10-CM

## 2016-07-09 NOTE — Telephone Encounter (Signed)
PATIENT HAD TO CANCEL HER APPOINTMENT FOR 07/17/16 BECAUSE SHE SAID "HER MONEY IS VERY TIGHT". [name] WANTS HER TO SLOWLY STOP TAKING ESTRADIOL 0.5 MG. SHE HAS ABOUT 4 OR 5 PILLS LEFT. DR. Tamala Julian SAID IF SHE STARTS TO HAVE A LOT OF HOT FLASHES THAT SHE WOULD LIKE HER TO START TAKING IT AGAIN. SHE WANTS TO KNOW IF DR. Tamala Julian WILL JUST REFILL IT FOR HER WITHOUT COMING IN THE OFFICE TO BE SEEN? BEST PHONE [phone] (CELL) PHARMACY CHOICE IS [facility] ON [address]. [place]

## 2016-07-10 NOTE — Telephone Encounter (Signed)
Please advise 

## 2016-07-12 NOTE — Telephone Encounter (Signed)
Please call for clarification. How is patient currently taking the estradiol?  I advised her to take one tablet daily x 6 days/week for one month, then one tablet daily x 5 days/wee for one month, then one tablet daily x 4 days/week for one month,then one tablet daily x 3 days/week for one month, then one tablet daily x 2 days/week for one month, then one tablet daily x 1 day/week, then stop.  Where is she on this taper?

## 2016-07-13 MED ORDER — ESTRADIOL 0.5 MG PO TABS: 0.5000 mg | ORAL_TABLET | ORAL | 1 refills | Status: DC

## 2016-07-13 MED ORDER — MELOXICAM 15 MG PO TABS: 15.0000 mg | ORAL_TABLET | Freq: Every day | ORAL | 0 refills | Status: DC

## 2016-07-13 NOTE — Telephone Encounter (Signed)
lmtcb

## 2016-07-13 NOTE — Telephone Encounter (Signed)
Pt  Is done with taper x 1 week and hot flashes are back .  Using otc tylenol and ibuprofen for stomach discomfort w/o releif and hopes to get an rx for pain meds too.

## 2016-07-13 NOTE — Telephone Encounter (Signed)
I sent in a refill of estradiol for patient to take one every other day for one month then decrease to one every three days.  I also sent in Meloxicam for abdominal wall strain; she should not take Ibuprofen when taking Meloxicam. She can take Tylenol while taking Meloxicam.

## 2016-07-15 NOTE — Telephone Encounter (Signed)
Pt advised.

## 2016-07-17 ENCOUNTER — Ambulatory Visit: Payer: BC Managed Care – PPO | Admitting: Family Medicine

## 2016-08-07 ENCOUNTER — Encounter: Payer: Self-pay | Admitting: Family Medicine

## 2016-08-07 ENCOUNTER — Ambulatory Visit (INDEPENDENT_AMBULATORY_CARE_PROVIDER_SITE_OTHER): Payer: BC Managed Care – PPO | Admitting: Family Medicine

## 2016-08-07 VITALS — BP 128/76 | HR 92 | Temp 97.7°F | Resp 16 | Ht 64.0 in | Wt 185.0 lb

## 2016-08-07 DIAGNOSIS — H8112 Benign paroxysmal vertigo, left ear: Secondary | ICD-10-CM | POA: Diagnosis not present

## 2016-08-07 DIAGNOSIS — J301 Allergic rhinitis due to pollen: Secondary | ICD-10-CM | POA: Diagnosis not present

## 2016-08-07 LAB — POCT URINALYSIS DIP (MANUAL ENTRY)
BILIRUBIN UA: NEGATIVE mg/dL
Bilirubin, UA: NEGATIVE
Blood, UA: NEGATIVE
Glucose, UA: NEGATIVE mg/dL
LEUKOCYTES UA: NEGATIVE
Nitrite, UA: NEGATIVE
PROTEIN UA: NEGATIVE mg/dL
Spec Grav, UA: 1.015 (ref 1.010–1.025)
Urobilinogen, UA: 0.2 E.U./dL
pH, UA: 6.5 (ref 5.0–8.0)

## 2016-08-07 MED ORDER — MECLIZINE HCL 12.5 MG PO TABS: 12.5000 mg | ORAL_TABLET | Freq: Three times a day (TID) | ORAL | 0 refills | Status: DC | PRN

## 2016-08-07 MED ORDER — FLUTICASONE PROPIONATE 50 MCG/ACT NA SUSP: 2.0000 | Freq: Every day | NASAL | 6 refills | Status: DC
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
## 2016-08-07 NOTE — Patient Instructions (Addendum)
CALL IF YOUR DIZZINESS HAS NOT IMPROVED.    IF you received an x-ray today, you will receive an invoice from [facility]. Please contact [facility] at [phone] with questions or concerns regarding your invoice.   IF you received labwork today, you will receive an invoice from [place]. Please contact [facility] at [phone] with questions or concerns regarding your invoice.   Our billing staff will not be able to assist you with questions regarding bills from these companies.  You will be contacted with the lab results as soon as they are available. The fastest way to get your results is to activate your My Chart account. Instructions are located on the last page of this paperwork. If you have not heard from [place] regarding the results in 2 weeks, please contact this office.    We recommend that you schedule a mammogram for breast cancer screening. Typically, you do not need a referral to do this. Please contact a local imaging center to schedule your mammogram.  [facility] - [phone]  *ask for the [facility] [place] ([place]) - [phone] or [phone]  [facility] - [phone] [place] [phone] [facility] - [phone]  *ask for the [facility] - [phone]  *ask for the Radiology Department [facility] - [phone]  *ask for the [place] - [phone]  Benign Positional Vertigo Vertigo is the feeling that you or your surroundings are moving when they are not. Benign positional vertigo is the most common form of vertigo. The cause of this condition is not serious (is benign). This condition is triggered by certain movements and positions (is positional). This condition can be dangerous if it occurs while you are doing something that could endanger you or others, such as  driving. What are the causes? In many cases, the cause of this condition is not known. It may be caused by a disturbance in an area of the inner ear that helps your brain to sense movement and balance. This disturbance can be caused by a viral infection (labyrinthitis), head injury, or repetitive motion. What increases the risk? This condition is more likely to develop in:  Women.  People who are 89 years of age or older. What are the signs or symptoms? Symptoms of this condition usually happen when you move your head or your eyes in different directions. Symptoms may start suddenly, and they usually last for less than a minute. Symptoms may include:  Loss of balance and falling.  Feeling like you are spinning or moving.  Feeling like your surroundings are spinning or moving.  Nausea and vomiting.  Blurred vision.  Dizziness.  Involuntary eye movement (nystagmus). Symptoms can be mild and cause only slight annoyance, or they can be severe and interfere with daily life. Episodes of benign positional vertigo may return (recur) over time, and they may be triggered by certain movements. Symptoms may improve over time. How is this diagnosed? This condition is usually diagnosed by medical history and a physical exam of the head, neck, and ears. You may be referred to a health care provider who specializes in ear, nose, and throat (ENT) problems (otolaryngologist) or a provider who specializes in disorders of the nervous system (neurologist). You may have additional testing, including:  MRI.  A CT scan.  Eye movement tests. Your health care provider may ask you to change positions quickly while he or  she watches you for symptoms of benign positional vertigo, such as nystagmus. Eye movement may be tested with an electronystagmogram (ENG), caloric stimulation, the Dix-Hallpike test, or the roll test.  An electroencephalogram (EEG). This records electrical activity in your brain.  Hearing  tests. How is this treated? Usually, your health care provider will treat this by moving your head in specific positions to adjust your inner ear back to normal. Surgery may be needed in severe cases, but this is rare. In some cases, benign positional vertigo may resolve on its own in 2-4 weeks. Follow these instructions at home: Safety   Move slowly.Avoid sudden body or head movements.  Avoid driving.  Avoid operating heavy machinery.  Avoid doing any tasks that would be dangerous to you or others if a vertigo episode would occur.  If you have trouble walking or keeping your balance, try using a cane for stability. If you feel dizzy or unstable, sit down right away.  Return to your normal activities as told by your health care provider. Ask your health care provider what activities are safe for you. General instructions   Take over-the-counter and prescription medicines only as told by your health care provider.  Avoid certain positions or movements as told by your health care provider.  Drink enough fluid to keep your urine clear or pale yellow.  Keep all follow-up visits as told by your health care provider. This is important. Contact a health care provider if:  You have a fever.  Your condition gets worse or you develop new symptoms.  Your family or friends notice any behavioral changes.  Your nausea or vomiting gets worse.  You have numbness or a "pins and needles" sensation. Get help right away if:  You have difficulty speaking or moving.  You are always dizzy.  You faint.  You develop severe headaches.  You have weakness in your legs or arms.  You have changes in your hearing or vision.  You develop a stiff neck.  You develop sensitivity to light. This information is not intended to replace advice given to you by your health care provider. Make sure you discuss any questions you have with your health care provider. Document Released: 01/14/2006 Document  Revised: 09/14/2015 Document Reviewed: 08/01/2014 Elsevier Interactive Patient Education  2017 [employer].   How to Perform the Epley Maneuver The Epley maneuver is an exercise that relieves symptoms of vertigo. Vertigo is the feeling that you or your surroundings are moving when they are not. When you feel vertigo, you may feel like the room is spinning and have trouble walking. Dizziness is a little different than vertigo. When you are dizzy, you may feel unsteady or light-headed. You can do this maneuver at home whenever you have symptoms of vertigo. You can do it up to 3 times a day until your symptoms go away. Even though the Epley maneuver may relieve your vertigo for a few weeks, it is possible that your symptoms will return. This maneuver relieves vertigo, but it does not relieve dizziness. What are the risks? If it is done correctly, the Epley maneuver is considered safe. Sometimes it can lead to dizziness or nausea that goes away after a short time. If you develop other symptoms, such as changes in vision, weakness, or numbness, stop doing the maneuver and call your health care provider. How to perform the Epley maneuver 1. Sit on the edge of a bed or table with your back straight and your legs extended or hanging  over the edge of the bed or table. 2. Turn your head halfway toward the affected ear or side. 3. Lie backward quickly with your head turned until you are lying flat on your back. You may want to position a pillow under your shoulders. 4. Hold this position for 30 seconds. You may experience an attack of vertigo. This is normal. 5. Turn your head to the opposite direction until your unaffected ear is facing the floor. 6. Hold this position for 30 seconds. You may experience an attack of vertigo. This is normal. Hold this position until the vertigo stops. 7. Turn your whole body to the same side as your head. Hold for another 30 seconds. 8. Sit back up. You can repeat this  exercise up to 3 times a day. Follow these instructions at home:  After doing the Epley maneuver, you can return to your normal activities.  Ask your health care provider if there is anything you should do at home to prevent vertigo. He or she may recommend that you:  Keep your head raised (elevated) with two or more pillows while you sleep.  Do not sleep on the side of your affected ear.  Get up slowly from bed.  Avoid sudden movements during the day.  Avoid extreme head movement, like looking up or bending over. Contact a health care provider if:  Your vertigo gets worse.  You have other symptoms, including:  Nausea.  Vomiting.  Headache. Get help right away if:  You have vision changes.  You have a severe or worsening headache or neck pain.  You cannot stop vomiting.  You have new numbness or weakness in any part of your body. Summary  Vertigo is the feeling that you or your surroundings are moving when they are not.  The Epley maneuver is an exercise that relieves symptoms of vertigo.  If the Epley maneuver is done correctly, it is considered safe. You can do it up to 3 times a day. This information is not intended to replace advice given to you by your health care provider. Make sure you discuss any questions you have with your health care provider. Document Released: 04/13/2013 Document Revised: 02/27/2016 Document Reviewed: 02/27/2016 Elsevier Interactive Patient Education  2017 [employer].

## 2016-08-08 LAB — CBC WITH DIFFERENTIAL/PLATELET
BASOS: 0 %
Basophils Absolute: 0 10*3/uL (ref 0.0–0.2)
EOS (ABSOLUTE): 0.2 10*3/uL (ref 0.0–0.4)
EOS: 2 %
HEMATOCRIT: 43.2 % (ref 34.0–46.6)
Hemoglobin: 14 g/dL (ref 11.1–15.9)
IMMATURE GRANS (ABS): 0 10*3/uL (ref 0.0–0.1)
IMMATURE GRANULOCYTES: 0 %
LYMPHS: 26 %
Lymphocytes Absolute: 2.4 10*3/uL (ref 0.7–3.1)
MCH: 24.4 pg — ABNORMAL LOW (ref 26.6–33.0)
MCHC: 32.4 g/dL (ref 31.5–35.7)
MCV: 75 fL — AB (ref 79–97)
MONOS ABS: 0.4 10*3/uL (ref 0.1–0.9)
Monocytes: 4 %
NEUTROS PCT: 68 %
Neutrophils Absolute: 6.5 10*3/uL (ref 1.4–7.0)
PLATELETS: 423 10*3/uL — AB (ref 150–379)
RBC: 5.74 x10E6/uL — AB (ref 3.77–5.28)
RDW: 17.2 % — AB (ref 12.3–15.4)
WBC: 9.5 10*3/uL (ref 3.4–10.8)

## 2016-08-08 LAB — COMPREHENSIVE METABOLIC PANEL
A/G RATIO: 1.4 (ref 1.2–2.2)
ALBUMIN: 4.4 g/dL (ref 3.5–5.5)
ALT: 29 IU/L (ref 0–32)
AST: 15 IU/L (ref 0–40)
Alkaline Phosphatase: 102 IU/L (ref 39–117)
BUN / CREAT RATIO: 19 (ref 9–23)
BUN: 13 mg/dL (ref 6–24)
CALCIUM: 10.8 mg/dL — AB (ref 8.7–10.2)
CHLORIDE: 102 mmol/L (ref 96–106)
CO2: 26 mmol/L (ref 18–29)
Creatinine, Ser: 0.67 mg/dL (ref 0.57–1.00)
GFR, EST AFRICAN AMERICAN: 112 mL/min/{1.73_m2} (ref >59)
GFR, EST NON AFRICAN AMERICAN: 97 mL/min/{1.73_m2} (ref >59)
GLOBULIN, TOTAL: 3.2 g/dL (ref 1.5–4.5)
Glucose: 111 mg/dL — ABNORMAL HIGH (ref 65–99)
POTASSIUM: 4.8 mmol/L (ref 3.5–5.2)
SODIUM: 142 mmol/L (ref 134–144)
TOTAL PROTEIN: 7.6 g/dL (ref 6.0–8.5)

## 2016-08-29 ENCOUNTER — Ambulatory Visit (INDEPENDENT_AMBULATORY_CARE_PROVIDER_SITE_OTHER): Payer: BC Managed Care – PPO

## 2016-08-29 ENCOUNTER — Encounter: Payer: Self-pay | Admitting: Physician Assistant

## 2016-08-29 ENCOUNTER — Ambulatory Visit (INDEPENDENT_AMBULATORY_CARE_PROVIDER_SITE_OTHER): Payer: BC Managed Care – PPO | Admitting: Physician Assistant

## 2016-08-29 VITALS — BP 126/85 | HR 78 | Temp 98.8°F | Ht 62.9 in | Wt 185.0 lb

## 2016-08-29 DIAGNOSIS — R059 Cough, unspecified: Secondary | ICD-10-CM

## 2016-08-29 DIAGNOSIS — R05 Cough: Secondary | ICD-10-CM | POA: Diagnosis not present

## 2016-08-29 DIAGNOSIS — J029 Acute pharyngitis, unspecified: Secondary | ICD-10-CM | POA: Diagnosis not present

## 2016-08-29 DIAGNOSIS — J039 Acute tonsillitis, unspecified: Secondary | ICD-10-CM

## 2016-08-29 DIAGNOSIS — M542 Cervicalgia: Secondary | ICD-10-CM | POA: Diagnosis not present

## 2016-08-29 LAB — POCT CBC
GRANULOCYTE PERCENT: 66 % (ref 37–80)
HEMATOCRIT: 41.8 % (ref 37.7–47.9)
HEMOGLOBIN: 13.9 g/dL (ref 12.2–16.2)
Lymph, poc: 3.6 — AB (ref 0.6–3.4)
MCH: 25.2 pg — AB (ref 27–31.2)
MCHC: 33.2 g/dL (ref 31.8–35.4)
MCV: 76 fL — AB (ref 80–97)
MID (cbc): 1.6 — AB (ref 0–0.9)
MPV: 7.5 fL (ref 0–99.8)
POC GRANULOCYTE: 10 — AB (ref 2–6.9)
POC LYMPH PERCENT: 23.5 %L (ref 10–50)
POC MID %: 10.5 % (ref 0–12)
Platelet Count, POC: 377 10*3/uL (ref 142–424)
RBC: 5.5 M/uL — AB (ref 4.04–5.48)
RDW, POC: 16.2 %
WBC: 15.2 10*3/uL — AB (ref 4.6–10.2)

## 2016-08-29 LAB — POCT RAPID STREP A (OFFICE): Rapid Strep A Screen: NEGATIVE

## 2016-08-29 MED ORDER — AMOXICILLIN-POT CLAVULANATE 875-125 MG PO TABS: 1.0000 | ORAL_TABLET | Freq: Two times a day (BID) | ORAL | 0 refills | Status: AC

## 2016-08-29 NOTE — Progress Notes (Addendum)
MRN: [id number] DOB: 04/30/1955  Subjective:   Elizabeth Best is a 61 y.o. female presenting for chief complaint of R/sided facial pain (x 3 days); Dysphagia (denies sore throat, but hard to swallow); Cough (thick yellow product); Nasal Congestion; and Generalized Body Aches .  Reports 3 day history of right sided sore throat and neck pain. Has pain with swallowing solid foods. Has associated myalgias and intermittent productive cough.  Has tried ibuprofen and chloraseptic spray with no relief. Denies fever, voice change, inability to swallowing, dyspnea,  sinus congestion, rhinorrhea, ear pain, wheezing and shortness of breath, night sweats, chills, weight loss, nausea, vomiting, abdominal pain and diarrhea. Denies acute injury. 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 No history of seasonal allergies, no history of asthma. Patient has not had flu shot this season. Current daily smoker, 0.5 ppd x 40 years. No alcohol. Denies any other aggravating or relieving factors, no other questions or concerns.  Elizabeth Best has a current medication list which includes the following prescription(s): estradiol, fluticasone, meclizine, meloxicam, trazodone, and xiidra. Also has No Known Allergies.  Elizabeth Best  has a past medical history of Depression; Osteopenia; and Substance abuse. Also  has a past surgical history that includes Cesarean section; Brain surgery; Breast surgery (1995); and Abdominal hysterectomy (04/22/1993).   Objective:   Vitals: BP 126/85 (BP Location: Right Arm, Patient Position: Sitting, Cuff Size: Large)   Pulse 78   Temp 98.8 F (37.1 C) (Oral)   Ht 5' 2.9" (1.598 m)   Wt 185 lb (83.9 kg)   SpO2 99%   BMI 32.88 kg/m   Physical Exam  Constitutional: She is oriented to person, place, and time. She appears well-developed and well-nourished. No distress.  HENT:  Head: Normocephalic and atraumatic.  Right Ear: Tympanic membrane, external ear and ear canal normal.   Left Ear: Tympanic membrane, external ear and ear canal normal.  Nose: Nose normal. Right sinus exhibits no maxillary sinus tenderness and no frontal sinus tenderness. Left sinus exhibits no maxillary sinus tenderness and no frontal sinus tenderness.  Mouth/Throat: Uvula is midline and mucous membranes are normal. No trismus in the jaw. Posterior oropharyngeal erythema present. Tonsils are 2+ on the right. Tonsils are 2+ on the left. Tonsillar exudate (most notable on right tonsil).  Eyes: Conjunctivae are normal.  Neck: Normal range of motion and full passive range of motion without pain. No neck rigidity. Normal range of motion present.  Moderate swelling noted in right supraclavicular region.  Pulmonary/Chest: Effort normal and breath sounds normal. She has no decreased breath sounds. She has no wheezes. She has no rhonchi. She has no rales.  Lymphadenopathy:       Head (right side): Submandibular adenopathy present. No submental, no tonsillar, no preauricular, no posterior auricular and no occipital adenopathy present.       Head (left side): No submental, no submandibular, no tonsillar, no preauricular, no posterior auricular and no occipital adenopathy present.    She has no cervical adenopathy.       Right cervical: No superficial cervical, no deep cervical and no posterior cervical adenopathy present.      Left cervical: No superficial cervical, no deep cervical and no posterior cervical adenopathy present.       Right: No supraclavicular adenopathy present.       Left: No supraclavicular adenopathy present.  Neurological: She is alert and oriented to person, place, and time.  Skin: Skin is warm and dry.  Psychiatric: She  has a normal mood and affect.  Vitals reviewed.   Results for orders placed or performed in visit on 08/29/16 (from the past 24 hour(s))  POCT rapid strep A     Status: None   Collection Time: 08/29/16 11:45 AM  Result Value Ref Range   Rapid Strep A Screen  Negative Negative  POCT CBC     Status: Abnormal   Collection Time: 08/29/16 12:51 PM  Result Value Ref Range   WBC 15.2 (A) 4.6 - 10.2 K/uL   Lymph, poc 3.6 (A) 0.6 - 3.4   POC LYMPH PERCENT 23.5 10 - 50 %L   MID (cbc) 1.6 (A) 0 - 0.9   POC MID % 10.5 0 - 12 %M   POC Granulocyte 10.0 (A) 2 - 6.9   Granulocyte percent 66.0 37 - 80 %G   RBC 5.50 (A) 4.04 - 5.48 M/uL   Hemoglobin 13.9 12.2 - 16.2 g/dL   HCT, POC 41.8 37.7 - 47.9 %   MCV 76.0 (A) 80 - 97 fL   MCH, POC 25.2 (A) 27 - 31.2 pg   MCHC 33.2 31.8 - 35.4 g/dL   RDW, POC 16.2 %   Platelet Count, POC 377 142 - 424 K/uL   MPV 7.5 0 - 99.8 fL   Dg Neck Soft Tissue  Result Date: 08/29/2016 CLINICAL DATA:  Cough and supraclavicular swelling EXAM: NECK SOFT TISSUES - 1+ VIEW COMPARISON:  None. FINDINGS: Seven cervical segments are well visualized. Mild osteophytic changes are noted from C3-C7. No prevertebral soft tissue changes are noted. The epiglottis is within normal limits. IMPRESSION: No acute abnormality noted. Electronically Signed   By: Inez Catalina M.D.   On: 08/29/2016 12:38   Dg Chest 2 View  Result Date: 08/29/2016 CLINICAL DATA:  Cough. EXAM: CHEST  2 VIEW COMPARISON:  12/27/2015 FINDINGS: Cardiomediastinal silhouette is normal. Mediastinal contours appear intact. There is no evidence of lobar airspace consolidation, pleural effusion or pneumothorax. Mild peribronchial airspace opacities, left greater than right lung base. Osseous structures are without acute abnormality. Soft tissues are grossly normal. IMPRESSION: Mild peribronchial airspace opacities, left greater than right lung base may represent atelectasis versus peribronchial consolidation. Electronically Signed   By: Fidela Salisbury M.D.   On: 08/29/2016 12:40   Assessment and Plan :  This case was precepted with Dr. Mingo Amber, who also examined the patient.  1. Sore throat - POCT rapid strep A - Culture, Group A Strep - DG Chest 2 View; Future - POCT  CBC 2. Cough - DG Chest 2 View; Future 3. Neck pain - Culture, Group A Strep - DG Neck Soft Tissue; Future  4. Acute tonsillitis, unspecified etiology Plain films and vitals are reassuring. Due to hx, PE findings, and CBC results, will treat for underlying bacterial etiology while awaiting strep culture. Pt instructed to return to clinic in 2 days for close follow up on symptoms. Given strict ED/return precautions if symptoms worsen or if she develops new concerning symptoms.  - amoxicillin-clavulanate (AUGMENTIN) 875-125 MG tablet; Take 1 tablet by mouth 2 (two) times daily.  Dispense: 20 tablet; Refill: 0  Tenna Delaine, PA-C  [facility] 08/29/2016 1:10 PM

## 2016-08-29 NOTE — Patient Instructions (Addendum)
Your plain films are reassuring. Your rapid strep was negative but I am sending out a culture. Your CBC shows an elevated white count. I am going to treat you with an antibiotic to cover for a bacterial etiology but I would like you to follow up closely for repeat blood work. Please return in 2 days for reevaluation. If any of your symptoms worsen or you develop inability to swallow, choking sensation, or a fever, seek care immediately.     Strep Throat Strep throat is an infection of the throat. It is caused by germs. Strep throat spreads from person to person because of coughing, sneezing, or close contact. Follow these instructions at home: Medicines   Take over-the-counter and prescription medicines only as told by your doctor.  Take your antibiotic medicine as told by your doctor. Do not stop taking the medicine even if you feel better.  Have family members who also have a sore throat or fever go to a doctor. Eating and drinking   Do not share food, drinking cups, or personal items.  Try eating soft foods until your sore throat feels better.  Drink enough fluid to keep your pee (urine) clear or pale yellow. General instructions   Rinse your mouth (gargle) with a salt-water mixture 3-4 times per day or as needed. To make a salt-water mixture, stir -1 tsp of salt into 1 cup of warm water.  Make sure that all people in your house wash their hands well.  Rest.  Stay home from school or work until you have been taking antibiotics for 24 hours.  Keep all follow-up visits as told by your doctor. This is important. Contact a doctor if:  Your neck keeps getting bigger.  You get a rash, cough, or earache.  You cough up thick liquid that is green, yellow-brown, or bloody.  You have pain that does not get better with medicine.  Your problems get worse instead of getting better.  You have a fever. Get help right away if:  You throw up (vomit).  You get a very bad  headache.  You neck hurts or it feels stiff.  You have chest pain or you are short of breath.  You have drooling, very bad throat pain, or changes in your voice.  Your neck is swollen or the skin gets red and tender.  Your mouth is dry or you are peeing less than normal.  You keep feeling more tired or it is hard to wake up.  Your joints are red or they hurt. This information is not intended to replace advice given to you by your health care provider. Make sure you discuss any questions you have with your health care provider. Document Released: 09/25/2007 Document Revised: 12/06/2015 Document Reviewed: 08/01/2014 Elsevier Interactive Patient Education  2017 [employer].  IF you received an x-ray today, you will receive an invoice from [facility]. Please contact [facility] at [phone] with questions or concerns regarding your invoice.   IF you received labwork today, you will receive an invoice from [place]. Please contact [facility] at [phone] with questions or concerns regarding your invoice.   Our billing staff will not be able to assist you with questions regarding bills from these companies.  You will be contacted with the lab results as soon as they are available. The fastest way to get your results is to activate your My Chart account. Instructions are located on the last page of this paperwork. If you have not heard from [place]  regarding the results in 2 weeks, please contact this office.

## 2016-08-31 ENCOUNTER — Ambulatory Visit (INDEPENDENT_AMBULATORY_CARE_PROVIDER_SITE_OTHER): Payer: BC Managed Care – PPO | Admitting: Emergency Medicine

## 2016-08-31 ENCOUNTER — Encounter: Payer: Self-pay | Admitting: Emergency Medicine

## 2016-08-31 VITALS — BP 132/87 | HR 68 | Temp 98.3°F | Resp 18 | Ht 62.9 in | Wt 185.4 lb

## 2016-08-31 DIAGNOSIS — J029 Acute pharyngitis, unspecified: Secondary | ICD-10-CM | POA: Diagnosis not present

## 2016-08-31 DIAGNOSIS — J039 Acute tonsillitis, unspecified: Secondary | ICD-10-CM | POA: Diagnosis not present

## 2016-08-31 NOTE — Patient Instructions (Addendum)
     IF you received an x-ray today, you will receive an invoice from [facility]. Please contact [place] [facility] at [phone] with questions or concerns regarding your invoice.   IF you received labwork today, you will receive an invoice from [facility]. Please contact [facility] at [phone] with questions or concerns regarding your invoice.   Our billing staff will not be able to assist you with questions regarding bills from these companies.  You will be contacted with the lab results as soon as they are available. The fastest way to get your results is to activate your My Chart account. Instructions are located on the last page of this paperwork. If you have not heard from us regarding the results in 2 weeks, please contact this office.     Pharyngitis Pharyngitis is a sore throat (pharynx). There is redness, pain, and swelling of your throat. Follow these instructions at home:  Drink enough fluids to keep your pee (urine) clear or pale yellow.  Only take medicine as told by your doctor.  You may get sick again if you do not take medicine as told. Finish your medicines, even if you start to feel better.  Do not take aspirin.  Rest.  Rinse your mouth (gargle) with salt water ( tsp of salt per 1 qt of water) every 1-2 hours. This will help the pain.  If you are not at risk for choking, you can suck on hard candy or sore throat lozenges. Contact a doctor if:  You have large, tender lumps on your neck.  You have a rash.  You cough up green, yellow-brown, or bloody spit. Get help right away if:  You have a stiff neck.  You drool or cannot swallow liquids.  You throw up (vomit) or are not able to keep medicine or liquids down.  You have very bad pain that does not go away with medicine.  You have problems breathing (not from a stuffy nose). This information is not intended to replace advice given to you by your health care provider. Make sure you  discuss any questions you have with your health care provider. Document Released: 09/25/2007 Document Revised: 09/14/2015 Document Reviewed: 12/14/2012 Elsevier Interactive Patient Education  2017 [employer]  
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
## 2016-09-01 LAB — CULTURE, GROUP A STREP: STREP A CULTURE: NEGATIVE

## 2016-10-18 ENCOUNTER — Other Ambulatory Visit: Payer: Self-pay | Admitting: Family Medicine

## 2016-10-18 DIAGNOSIS — Z1231 Encounter for screening mammogram for malignant neoplasm of breast: Secondary | ICD-10-CM

## 2016-11-05 ENCOUNTER — Ambulatory Visit
Admission: RE | Admit: 2016-11-05 | Discharge: 2016-11-05 | Disposition: A | Discharge status: Home / Self Care | Payer: BC Managed Care – PPO | Source: Ambulatory Visit | Attending: Family Medicine | Admitting: Family Medicine

## 2016-11-05 DIAGNOSIS — Z1231 Encounter for screening mammogram for malignant neoplasm of breast: Secondary | ICD-10-CM

## 2017-01-17 ENCOUNTER — Telehealth: Payer: Self-pay | Admitting: Family Medicine

## 2017-01-17 NOTE — Telephone Encounter (Signed)
Pt is still experiencing back pain, asking if a Cortizone shot or back specialist referral might help   [phone]

## 2017-01-18 ENCOUNTER — Encounter (HOSPITAL_COMMUNITY): Payer: Self-pay | Admitting: *Deleted

## 2017-01-18 ENCOUNTER — Ambulatory Visit (HOSPITAL_COMMUNITY)
Admission: EM | Admit: 2017-01-18 | Discharge: 2017-01-18 | Disposition: A | Discharge status: Home / Self Care | Payer: BC Managed Care – PPO | Attending: Emergency Medicine | Admitting: Emergency Medicine

## 2017-01-18 DIAGNOSIS — K0889 Other specified disorders of teeth and supporting structures: Secondary | ICD-10-CM

## 2017-01-18 DIAGNOSIS — M26609 Unspecified temporomandibular joint disorder, unspecified side: Secondary | ICD-10-CM

## 2017-01-18 MED ORDER — TRAMADOL HCL 50 MG PO TABS: 50.0000 mg | ORAL_TABLET | Freq: Four times a day (QID) | ORAL | 0 refills | Status: DC | PRN

## 2017-01-18 MED ORDER — DICLOFENAC POTASSIUM 50 MG PO TABS: 50.0000 mg | ORAL_TABLET | Freq: Three times a day (TID) | ORAL | 0 refills | Status: DC

## 2017-01-18 MED ORDER — AMOXICILLIN 500 MG PO CAPS: 1000.0000 mg | ORAL_CAPSULE | Freq: Two times a day (BID) | ORAL | 0 refills | Status: DC
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
## 2017-01-18 NOTE — Discharge Instructions (Signed)
The primary source of the pain in the left side of your face is in the jaw joint called the TMJ. He also have some tenderness to the upper second molar tooth. Take the medication as directed. Stop taking the meloxicam and preference of the Cataflam. Take this with food. Be sure to make an appointment with your primary care doctor next week as soon as possible. May also need to see a dentist as soon as possible.

## 2017-01-18 NOTE — ED Triage Notes (Signed)
C/O intermittent left facial pain - pt states she feels it's originating from left upper and lower teeth.  Has tried many OTC meds without relief.

## 2017-01-18 NOTE — Telephone Encounter (Signed)
PT IS CALLING ABOUT REFERRAL FOR HER BACK PLEASE RESPOND

## 2017-01-21 NOTE — Telephone Encounter (Signed)
I called patient advised she should come inf or evaluation.  She states she is taking pain pills everyday, and would like to discuss other options .   Patient stated she would call back to book the appointment

## 2017-01-21 NOTE — Telephone Encounter (Signed)
Agree with recommendation for evaluation/appointment.

## 2017-02-17 ENCOUNTER — Ambulatory Visit (HOSPITAL_COMMUNITY)
Admission: EM | Admit: 2017-02-17 | Discharge: 2017-02-17 | Disposition: A | Discharge status: Home / Self Care | Payer: Worker's Compensation | Attending: Emergency Medicine | Admitting: Emergency Medicine

## 2017-02-17 ENCOUNTER — Encounter (HOSPITAL_COMMUNITY): Payer: Self-pay | Admitting: Emergency Medicine

## 2017-02-17 ENCOUNTER — Ambulatory Visit (INDEPENDENT_AMBULATORY_CARE_PROVIDER_SITE_OTHER): Payer: Worker's Compensation

## 2017-02-17 DIAGNOSIS — M79671 Pain in right foot: Secondary | ICD-10-CM

## 2017-02-17 DIAGNOSIS — S9031XA Contusion of right foot, initial encounter: Secondary | ICD-10-CM

## 2017-02-17 MED ORDER — HYDROCODONE-ACETAMINOPHEN 5-325 MG PO TABS: 1.0000 | ORAL_TABLET | ORAL | 0 refills | Status: DC | PRN
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
## 2017-02-17 NOTE — ED Triage Notes (Signed)
Pt sts right foot pain since dropping box on foot at work that became worse today

## 2017-02-22 ENCOUNTER — Ambulatory Visit: Payer: BC Managed Care – PPO | Admitting: Family Medicine

## 2017-02-28 ENCOUNTER — Ambulatory Visit (HOSPITAL_COMMUNITY)
Admission: EM | Admit: 2017-02-28 | Discharge: 2017-02-28 | Disposition: A | Discharge status: Home / Self Care | Payer: BC Managed Care – PPO | Attending: Emergency Medicine | Admitting: Emergency Medicine

## 2017-02-28 ENCOUNTER — Encounter (HOSPITAL_COMMUNITY): Payer: Self-pay | Admitting: Emergency Medicine

## 2017-02-28 DIAGNOSIS — R42 Dizziness and giddiness: Secondary | ICD-10-CM

## 2017-02-28 DIAGNOSIS — I10 Essential (primary) hypertension: Secondary | ICD-10-CM | POA: Diagnosis not present

## 2017-02-28 MED ORDER — HYDROCHLOROTHIAZIDE 25 MG PO TABS: 25.0000 mg | ORAL_TABLET | Freq: Every day | ORAL | 0 refills | Status: DC

## 2017-02-28 NOTE — ED Triage Notes (Signed)
Pt reports dizziness onset this am that increases when bending over... Works as a custodian   denies fevers, chills, nauseas  Voices no other concerns.   Pt reports she drinks 2-3 cups of coffee.   Reports a nurse at her job took her BP and it was 164/90  A&O x4... NAD... Ambulatory

## 2017-02-28 NOTE — Discharge Instructions (Signed)
It seems that you have elevated blood pressure with symptoms of dizziness With starting this medication you will need to follow up with a primary care doctor to follow you  Be cautious with sitting and standing due to your blood pressure can drop  If you have symptoms of weakness, chest pain, slurred speech you need to go to the Emergency room
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
## 2017-03-03 NOTE — Telephone Encounter (Signed)
Done

## 2017-03-05 ENCOUNTER — Other Ambulatory Visit: Payer: Self-pay | Admitting: Occupational Medicine

## 2017-03-05 ENCOUNTER — Ambulatory Visit: Payer: Self-pay

## 2017-03-05 DIAGNOSIS — M79671 Pain in right foot: Secondary | ICD-10-CM

## 2017-09-15 ENCOUNTER — Encounter: Payer: Self-pay | Admitting: Family Medicine

## 2017-10-23 ENCOUNTER — Encounter (HOSPITAL_COMMUNITY): Payer: Self-pay | Admitting: Emergency Medicine

## 2017-10-23 ENCOUNTER — Emergency Department (HOSPITAL_COMMUNITY)
Admission: EM | Admit: 2017-10-23 | Discharge: 2017-10-24 | Disposition: A | Discharge status: Home / Self Care | Payer: BC Managed Care – PPO | Attending: Emergency Medicine | Admitting: Emergency Medicine

## 2017-10-23 ENCOUNTER — Other Ambulatory Visit: Payer: Self-pay

## 2017-10-23 DIAGNOSIS — R519 Headache, unspecified: Secondary | ICD-10-CM

## 2017-10-23 DIAGNOSIS — R030 Elevated blood-pressure reading, without diagnosis of hypertension: Secondary | ICD-10-CM | POA: Insufficient documentation

## 2017-10-23 DIAGNOSIS — F17210 Nicotine dependence, cigarettes, uncomplicated: Secondary | ICD-10-CM | POA: Diagnosis not present

## 2017-10-23 DIAGNOSIS — Z79899 Other long term (current) drug therapy: Secondary | ICD-10-CM | POA: Insufficient documentation

## 2017-10-23 DIAGNOSIS — Z760 Encounter for issue of repeat prescription: Secondary | ICD-10-CM | POA: Diagnosis not present

## 2017-10-23 DIAGNOSIS — R51 Headache: Secondary | ICD-10-CM | POA: Diagnosis present

## 2017-10-23 NOTE — ED Triage Notes (Signed)
Pt from home. Since yesterday has had left sided occipital pain that is "pounding"  Tylenol relieves but only for about an hour.

## 2017-10-24 ENCOUNTER — Emergency Department (HOSPITAL_COMMUNITY): Payer: BC Managed Care – PPO

## 2017-10-24 ENCOUNTER — Other Ambulatory Visit: Payer: Self-pay

## 2017-10-24 LAB — CBC WITH DIFFERENTIAL/PLATELET
Abs Immature Granulocytes: 0 10*3/uL (ref 0.0–0.1)
BASOS ABS: 0 10*3/uL (ref 0.0–0.1)
Basophils Relative: 0 %
EOS PCT: 2 %
Eosinophils Absolute: 0.2 10*3/uL (ref 0.0–0.7)
HEMATOCRIT: 43.3 % (ref 36.0–46.0)
HEMOGLOBIN: 13.4 g/dL (ref 12.0–15.0)
IMMATURE GRANULOCYTES: 0 %
LYMPHS ABS: 3.7 10*3/uL (ref 0.7–4.0)
LYMPHS PCT: 35 %
MCH: 24.1 pg — ABNORMAL LOW (ref 26.0–34.0)
MCHC: 30.9 g/dL (ref 30.0–36.0)
MCV: 78 fL (ref 78.0–100.0)
Monocytes Absolute: 0.7 10*3/uL (ref 0.1–1.0)
Monocytes Relative: 6 %
NEUTROS PCT: 57 %
Neutro Abs: 6.1 10*3/uL (ref 1.7–7.7)
Platelets: ADEQUATE 10*3/uL (ref 150–400)
RBC: 5.55 MIL/uL — AB (ref 3.87–5.11)
RDW: 16.6 % — AB (ref 11.5–15.5)
WBC: 10.7 10*3/uL — AB (ref 4.0–10.5)

## 2017-10-24 LAB — BASIC METABOLIC PANEL
ANION GAP: 6 (ref 5–15)
BUN: 12 mg/dL (ref 6–20)
CALCIUM: 9.8 mg/dL (ref 8.9–10.3)
CHLORIDE: 108 mmol/L (ref 98–111)
CO2: 26 mmol/L (ref 22–32)
CREATININE: 0.91 mg/dL (ref 0.44–1.00)
GFR calc non Af Amer: >60 mL/min (ref >60)
Glucose, Bld: 91 mg/dL (ref 70–99)
Potassium: 4.2 mmol/L (ref 3.5–5.1)
SODIUM: 140 mmol/L (ref 135–145)

## 2017-10-24 MED ORDER — HYDROCHLOROTHIAZIDE 25 MG PO TABS: 25.0000 mg | ORAL_TABLET | Freq: Every day | ORAL | 0 refills | Status: DC

## 2017-10-24 MED ORDER — METOCLOPRAMIDE HCL 5 MG/ML IJ SOLN
10.0000 mg | Freq: Once | INTRAMUSCULAR | Status: AC
  Administered 2017-10-24: 10 mg via INTRAVENOUS
  Filled 2017-10-24: qty 2

## 2017-10-24 MED ORDER — HYDRALAZINE HCL 20 MG/ML IJ SOLN: 10.0000 mg | INTRAMUSCULAR | Status: DC

## 2017-10-24 MED ORDER — DIPHENHYDRAMINE HCL 50 MG/ML IJ SOLN
25.0000 mg | Freq: Once | INTRAMUSCULAR | Status: AC
  Administered 2017-10-24: 25 mg via INTRAVENOUS
  Filled 2017-10-24: qty 1

## 2017-10-24 NOTE — ED Notes (Signed)
Remaining labs running now.

## 2017-10-24 NOTE — ED Notes (Signed)
Patient verbalizes understanding of discharge instructions/medications; no further questions at this time.
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
## 2017-10-24 NOTE — Discharge Instructions (Signed)
You had several elevated blood pressure readings.  I have refilled your blood pressure medication.  Please take this as directed.  Please follow-up with a primary care provider in the next 1 to 2 weeks.  Return if your symptoms worsen.  The CT scan of your head was normal.

## 2017-10-31 ENCOUNTER — Other Ambulatory Visit: Payer: Self-pay

## 2017-10-31 ENCOUNTER — Encounter: Payer: Self-pay | Admitting: Family Medicine

## 2017-10-31 ENCOUNTER — Ambulatory Visit: Payer: BC Managed Care – PPO | Admitting: Family Medicine

## 2017-10-31 ENCOUNTER — Other Ambulatory Visit: Payer: Self-pay | Admitting: Family Medicine

## 2017-10-31 VITALS — BP 118/72 | HR 90 | Temp 98.0°F | Resp 16 | Wt 188.0 lb

## 2017-10-31 DIAGNOSIS — M545 Low back pain, unspecified: Secondary | ICD-10-CM | POA: Insufficient documentation

## 2017-10-31 DIAGNOSIS — Z1231 Encounter for screening mammogram for malignant neoplasm of breast: Secondary | ICD-10-CM

## 2017-10-31 DIAGNOSIS — I10 Essential (primary) hypertension: Secondary | ICD-10-CM

## 2017-10-31 DIAGNOSIS — R232 Flushing: Secondary | ICD-10-CM

## 2017-10-31 DIAGNOSIS — G8929 Other chronic pain: Secondary | ICD-10-CM | POA: Insufficient documentation

## 2017-10-31 DIAGNOSIS — J301 Allergic rhinitis due to pollen: Secondary | ICD-10-CM | POA: Insufficient documentation

## 2017-10-31 DIAGNOSIS — E7800 Pure hypercholesterolemia, unspecified: Secondary | ICD-10-CM | POA: Diagnosis not present

## 2017-10-31 DIAGNOSIS — E6609 Other obesity due to excess calories: Secondary | ICD-10-CM

## 2017-10-31 DIAGNOSIS — Z6833 Body mass index (BMI) 33.0-33.9, adult: Secondary | ICD-10-CM

## 2017-10-31 DIAGNOSIS — F1911 Other psychoactive substance abuse, in remission: Secondary | ICD-10-CM

## 2017-10-31 DIAGNOSIS — Z87898 Personal history of other specified conditions: Secondary | ICD-10-CM

## 2017-10-31 DIAGNOSIS — G4452 New daily persistent headache (NDPH): Secondary | ICD-10-CM | POA: Diagnosis not present

## 2017-10-31 DIAGNOSIS — F5101 Primary insomnia: Secondary | ICD-10-CM

## 2017-10-31 LAB — POCT URINALYSIS DIP (MANUAL ENTRY)
Bilirubin, UA: NEGATIVE
Glucose, UA: NEGATIVE mg/dL
Ketones, POC UA: NEGATIVE mg/dL
Leukocytes, UA: NEGATIVE
NITRITE UA: NEGATIVE
PH UA: 6 (ref 5.0–8.0)
PROTEIN UA: NEGATIVE mg/dL
Spec Grav, UA: 1.02 (ref 1.010–1.025)
Urobilinogen, UA: 0.2 E.U./dL

## 2017-10-31 MED ORDER — GABAPENTIN 100 MG PO CAPS: 100.0000 mg | ORAL_CAPSULE | Freq: Every day | ORAL | 3 refills | Status: DC

## 2017-10-31 MED ORDER — HYDROCHLOROTHIAZIDE 25 MG PO TABS: 25.0000 mg | ORAL_TABLET | Freq: Every day | ORAL | 1 refills | Status: DC
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
## 2017-10-31 NOTE — Patient Instructions (Addendum)
Check blood pressure weekly.   Recommend weight loss, exercise for 30-60 minutes five days per week; recommend 1200 kcal restriction per day with a minimum of 60 grams of protein per day.  Eat 3 meals per day. Do not skip meals. Consider having a protein shake as a meal replacement to aid with eliminating meal skipping. Look for products with <220 calories, <7 gm sugar, and 20-30 gm protein.  Eat breakfast within 2 hours of getting up.   Make  your plate non-starchy vegetables,  protein, and  carbohydrates at lunch and dinner.   Aim for at least 64 oz. of calorie-free beverages daily (water, Crystal Light, diet green tea, etc.). Eliminate any sugary beverages such as regular soda, sweet tea, or fruit juice.   Pay attention to hunger and fullness cues.  Stop eating once you feel satisfied; don't wait until you feel full, stuffed, or sick from eating.  Choose lean meats and low fat/fat free dairy products.  Choose foods high in fiber such as fruits, vegetables, and whole grains (brown rice, whole wheat pasta, whole wheat bread, etc.).  Limit foods with added sugar to <7 gm per serving.  Always eat in the kitchen/dining room.  Never eat in the bedroom or in front of the TV.       IF you received an x-ray today, you will receive an invoice from [facility]. Please contact [facility] at [phone] with questions or concerns regarding your invoice.   IF you received labwork today, you will receive an invoice from [facility]. Please contact [facility] at [phone] with questions or concerns regarding your invoice.   Our billing staff will not be able to assist you with questions regarding bills from these companies.  You will be contacted with the lab results as soon as they are available. The fastest way to get your results is to activate your My Chart account. Instructions are located on the last page of this paperwork. If you have not heard from [place] regarding the  results in 2 weeks, please contact this office.      Managing Your Hypertension Hypertension is commonly called high blood pressure. This is when the force of your blood pressing against the walls of your arteries is too strong. Arteries are blood vessels that carry blood from your heart throughout your body. Hypertension forces the heart to work harder to pump blood, and may cause the arteries to become narrow or stiff. Having untreated or uncontrolled hypertension can cause heart attack, stroke, kidney disease, and other problems. What are blood pressure readings? A blood pressure reading consists of a higher number over a lower number. Ideally, your blood pressure should be below 120/80. The first ("top") number is called the systolic pressure. It is a measure of the pressure in your arteries as your heart beats. The second ("bottom") number is called the diastolic pressure. It is a measure of the pressure in your arteries as the heart relaxes. What does my blood pressure reading mean? Blood pressure is classified into four stages. Based on your blood pressure reading, your health care provider may use the following stages to determine what type of treatment you need, if any. Systolic pressure and diastolic pressure are measured in a unit called mm Hg. Normal  Systolic pressure: below 297.  Diastolic pressure: below 80. Elevated  Systolic pressure: 989-211.  Diastolic pressure: below 80. Hypertension stage 1  Systolic pressure: 941-740.  Diastolic pressure: 81-44. Hypertension stage 2  Systolic pressure: 818 or above.  Diastolic pressure: 90 or above. What health risks are associated with hypertension? Managing your hypertension is an important responsibility. Uncontrolled hypertension can lead to:  A heart attack.  A stroke.  A weakened blood vessel (aneurysm).  Heart failure.  Kidney damage.  Eye damage.  Metabolic syndrome.  Memory and concentration  problems.  What changes can I make to manage my hypertension? Hypertension can be managed by making lifestyle changes and possibly by taking medicines. Your health care provider will help you make a plan to bring your blood pressure within a normal range. Eating and drinking  Eat a diet that is high in fiber and potassium, and low in salt (sodium), added sugar, and fat. An example eating plan is called the DASH (Dietary Approaches to Stop Hypertension) diet. To eat this way: ? Eat plenty of fresh fruits and vegetables. Try to fill half of your plate at each meal with fruits and vegetables. ? Eat whole grains, such as whole wheat pasta, brown rice, or whole grain bread. Fill about one quarter of your plate with whole grains. ? Eat low-fat diary products. ? Avoid fatty cuts of meat, processed or cured meats, and poultry with skin. Fill about one quarter of your plate with lean proteins such as fish, chicken without skin, beans, eggs, and tofu. ? Avoid premade and processed foods. These tend to be higher in sodium, added sugar, and fat.  Reduce your daily sodium intake. Most people with hypertension should eat less than 1,500 mg of sodium a day.  Limit alcohol intake to no more than 1 drink a day for nonpregnant women and 2 drinks a day for men. One drink equals 12 oz of beer, 5 oz of wine, or 1 oz of hard liquor. Lifestyle  Work with your health care provider to maintain a healthy body weight, or to lose weight. Ask what an ideal weight is for you.  Get at least 30 minutes of exercise that causes your heart to beat faster (aerobic exercise) most days of the week. Activities may include walking, swimming, or biking.  Include exercise to strengthen your muscles (resistance exercise), such as weight lifting, as part of your weekly exercise routine. Try to do these types of exercises for 30 minutes at least 3 days a week.  Do not use any products that contain nicotine or tobacco, such as  cigarettes and e-cigarettes. If you need help quitting, ask your health care provider.  Control any long-term (chronic) conditions you have, such as high cholesterol or diabetes. Monitoring  Monitor your blood pressure at home as told by your health care provider. Your personal target blood pressure may vary depending on your medical conditions, your age, and other factors.  Have your blood pressure checked regularly, as often as told by your health care provider. Working with your health care provider  Review all the medicines you take with your health care provider because there may be side effects or interactions.  Talk with your health care provider about your diet, exercise habits, and other lifestyle factors that may be contributing to hypertension.  Visit your health care provider regularly. Your health care provider can help you create and adjust your plan for managing hypertension. Will I need medicine to control my blood pressure? Your health care provider may prescribe medicine if lifestyle changes are not enough to get your blood pressure under control, and if:  Your systolic blood pressure is 130 or higher.  Your diastolic blood pressure is 80 or higher.  Take medicines only as told by your health care provider. Follow the directions carefully. Blood pressure medicines must be taken as prescribed. The medicine does not work as well when you skip doses. Skipping doses also puts you at risk for problems. Contact a health care provider if:  You think you are having a reaction to medicines you have taken.  You have repeated (recurrent) headaches.  You feel dizzy.  You have swelling in your ankles.  You have trouble with your vision. Get help right away if:  You develop a severe headache or confusion.  You have unusual weakness or numbness, or you feel faint.  You have severe pain in your chest or abdomen.  You vomit repeatedly.  You have trouble  breathing. Summary  Hypertension is when the force of blood pumping through your arteries is too strong. If this condition is not controlled, it may put you at risk for serious complications.  Your personal target blood pressure may vary depending on your medical conditions, your age, and other factors. For most people, a normal blood pressure is less than 120/80.  Hypertension is managed by lifestyle changes, medicines, or both. Lifestyle changes include weight loss, eating a healthy, low-sodium diet, exercising more, and limiting alcohol. This information is not intended to replace advice given to you by your health care provider. Make sure you discuss any questions you have with your health care provider. Document Released: 01/01/2012 Document Revised: 03/06/2016 Document Reviewed: 03/06/2016 Elsevier Interactive Patient Education  [employer].

## 2017-11-01 LAB — CBC WITH DIFFERENTIAL/PLATELET
BASOS ABS: 0 10*3/uL (ref 0.0–0.2)
Basos: 0 %
EOS (ABSOLUTE): 0.1 10*3/uL (ref 0.0–0.4)
Eos: 2 %
Hematocrit: 44.2 % (ref 34.0–46.6)
Hemoglobin: 14.7 g/dL (ref 11.1–15.9)
IMMATURE GRANS (ABS): 0 10*3/uL (ref 0.0–0.1)
IMMATURE GRANULOCYTES: 0 %
LYMPHS: 33 %
Lymphocytes Absolute: 2.6 10*3/uL (ref 0.7–3.1)
MCH: 25.1 pg — ABNORMAL LOW (ref 26.6–33.0)
MCHC: 33.3 g/dL (ref 31.5–35.7)
MCV: 75 fL — ABNORMAL LOW (ref 79–97)
Monocytes Absolute: 0.3 10*3/uL (ref 0.1–0.9)
Monocytes: 4 %
NEUTROS PCT: 61 %
Neutrophils Absolute: 4.9 10*3/uL (ref 1.4–7.0)
Platelets: 396 10*3/uL (ref 150–450)
RBC: 5.86 x10E6/uL — AB (ref 3.77–5.28)
RDW: 16.4 % — ABNORMAL HIGH (ref 12.3–15.4)
WBC: 7.9 10*3/uL (ref 3.4–10.8)

## 2017-11-01 LAB — LIPID PANEL
CHOL/HDL RATIO: 6.6 ratio — AB (ref 0.0–4.4)
Cholesterol, Total: 246 mg/dL — ABNORMAL HIGH (ref 100–199)
HDL: 37 mg/dL — ABNORMAL LOW (ref >39)
LDL CALC: 175 mg/dL — AB (ref 0–99)
Triglycerides: 168 mg/dL — ABNORMAL HIGH (ref 0–149)
VLDL Cholesterol Cal: 34 mg/dL (ref 5–40)

## 2017-11-01 LAB — COMPREHENSIVE METABOLIC PANEL
ALBUMIN: 4.3 g/dL (ref 3.5–5.5)
ALT: 29 IU/L (ref 0–32)
AST: 17 IU/L (ref 0–40)
Albumin/Globulin Ratio: 1.4 (ref 1.2–2.2)
Alkaline Phosphatase: 122 IU/L — ABNORMAL HIGH (ref 39–117)
BUN / CREAT RATIO: 15 (ref 9–23)
BUN: 12 mg/dL (ref 6–24)
Bilirubin Total: <0.2 mg/dL (ref 0.0–1.2)
CALCIUM: 10.8 mg/dL — AB (ref 8.7–10.2)
CO2: 22 mmol/L (ref 20–29)
CREATININE: 0.82 mg/dL (ref 0.57–1.00)
Chloride: 102 mmol/L (ref 96–106)
GFR, EST AFRICAN AMERICAN: 91 mL/min/{1.73_m2} (ref >59)
GFR, EST NON AFRICAN AMERICAN: 79 mL/min/{1.73_m2} (ref >59)
GLOBULIN, TOTAL: 3.1 g/dL (ref 1.5–4.5)
Glucose: 95 mg/dL (ref 65–99)
Potassium: 4 mmol/L (ref 3.5–5.2)
SODIUM: 140 mmol/L (ref 134–144)
TOTAL PROTEIN: 7.4 g/dL (ref 6.0–8.5)

## 2017-11-01 LAB — TSH: TSH: 0.801 u[IU]/mL (ref 0.450–4.500)

## 2017-11-04 ENCOUNTER — Encounter: Payer: Self-pay | Admitting: Family Medicine

## 2017-11-10 ENCOUNTER — Encounter: Payer: Self-pay | Admitting: Registered"

## 2017-11-10 ENCOUNTER — Encounter: Payer: BC Managed Care – PPO | Attending: Family Medicine | Admitting: Registered"

## 2017-11-10 DIAGNOSIS — E6609 Other obesity due to excess calories: Secondary | ICD-10-CM | POA: Insufficient documentation

## 2017-11-10 DIAGNOSIS — Z6833 Body mass index (BMI) 33.0-33.9, adult: Secondary | ICD-10-CM | POA: Insufficient documentation

## 2017-11-10 DIAGNOSIS — Z713 Dietary counseling and surveillance: Secondary | ICD-10-CM | POA: Diagnosis present

## 2017-11-10 DIAGNOSIS — I10 Essential (primary) hypertension: Secondary | ICD-10-CM

## 2017-11-10 DIAGNOSIS — E7800 Pure hypercholesterolemia, unspecified: Secondary | ICD-10-CM | POA: Diagnosis not present

## 2017-11-10 NOTE — Progress Notes (Signed)
  Medical Nutrition Therapy:  Appt start time: 3:05 end time:  4:12.   Assessment:  Primary concerns today: Pt states she wants to lose 50 lbs.   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 Pt states she is also a full time student getting masters degree in 3 weeks. Pt states she takes laxatives to help lose weight. Pt states she is stressed due to weight and finances. Pt states she sleeps about 3 hours, current smoker, and has high blood pressure.  Pt states she values living healthy and wants to live a long time. Pt states she gave up cookies, candy and replaced with fruit. Pt states she has changed to decaf coffee. Pt states she is lactose-intolerant and drinks 1-2% cow's milk. Pt states she has made changes within the last 4-5 days.   Pt states she is trying to cook more at home. Pt states she eats late at night sometimes. Pt states she loves oatmeal and chinese food.   Preferred Learning Style:   No preference indicated   Learning Readiness:   Contemplating  Ready  Change in progress   MEDICATIONS: See list   DIETARY INTAKE:  Usual eating pattern includes 2 meals and 1-2 snacks per day.  Everyday foods include chinese food and fast food.  Avoided foods include pork, okra.    24-hr recall:  B ( AM): cigarette; sometimes [place] bacon, egg, cheese or waffles or typically skips  Snk ( AM): none  L ( PM): fast food-burger, fries or Subway or steak and cheese sub or fried chicken, cole slaw, sometimes biscuit Snk ( PM): sometimes fruit D ( PM): steak, salad or chinese food Snk ( PM): sometimes candy Beverages: coffee, 1-2% milk, water, diet green tea  Usual physical activity: none stated  Estimated energy needs: 1600 calories 180 g carbohydrates 120 g protein 44 g fat  Progress Towards Goal(s):  In progress.   Nutritional Diagnosis:  NB-1.1 Food and nutrition-related  knowledge deficit As related to no prior nutrition-related education.  As evidenced by pt reports inaccurate nutrition information.    Intervention:  Nutrition education and counseling. Pt was educated and counseled on hypertension, DASH plan, sodium intake, reading nutrition facts labels, and fast food. Pt was in agreement with goals listed.  Goals: - Increase physical activity to at least 15-30 min, 2 days/week.  - Continue to increase daily fruit and vegetable intake.  - Read nutrition facts labels for sodium content.  - Limit sodium to less than 2300 mg a day.  - Reduce eating out to twice a week.   Teaching Method Utilized:  Visual Auditory Hands on  Handouts given during visit include:  Hypertension Nutrition Therapy  Barriers to learning/adherence to lifestyle change: contemplative stage of change  Demonstrated degree of understanding via:  Teach Back   Monitoring/Evaluation:  Dietary intake, exercise, and body weight prn.

## 2017-11-10 NOTE — Patient Instructions (Addendum)
-   Increase physical activity to at least 15-30 min, 2 days/week.   - Continue to increase daily fruit and vegetable intake.   - Read nutrition facts labels for sodium content.   - Limit sodium to less than 2300 mg a day.   - Reduce eating out to twice a week.

## 2017-11-26 ENCOUNTER — Ambulatory Visit
Admission: RE | Admit: 2017-11-26 | Discharge: 2017-11-26 | Disposition: A | Discharge status: Home / Self Care | Payer: BC Managed Care – PPO | Source: Ambulatory Visit | Attending: Family Medicine | Admitting: Family Medicine

## 2017-11-26 DIAGNOSIS — Z1231 Encounter for screening mammogram for malignant neoplasm of breast: Secondary | ICD-10-CM

## 2018-01-27 ENCOUNTER — Telehealth: Payer: Self-pay | Admitting: Family Medicine

## 2018-01-27 NOTE — Telephone Encounter (Signed)
Called pt. To reschedule visit on 03/07/18. Left detailed message. If pt. Calls back please reschedule.

## 2018-01-30 ENCOUNTER — Ambulatory Visit: Payer: BC Managed Care – PPO | Admitting: Family Medicine

## 2018-02-21 ENCOUNTER — Encounter: Payer: Self-pay | Admitting: Family Medicine

## 2018-02-21 ENCOUNTER — Ambulatory Visit: Payer: BC Managed Care – PPO | Admitting: Family Medicine

## 2018-02-21 VITALS — BP 120/81 | HR 72 | Temp 97.9°F | Resp 16 | Ht 63.5 in | Wt 187.0 lb

## 2018-02-21 DIAGNOSIS — Z6832 Body mass index (BMI) 32.0-32.9, adult: Secondary | ICD-10-CM

## 2018-02-21 DIAGNOSIS — F419 Anxiety disorder, unspecified: Secondary | ICD-10-CM | POA: Diagnosis not present

## 2018-02-21 DIAGNOSIS — E6609 Other obesity due to excess calories: Secondary | ICD-10-CM | POA: Diagnosis not present

## 2018-02-21 DIAGNOSIS — E7800 Pure hypercholesterolemia, unspecified: Secondary | ICD-10-CM

## 2018-02-21 DIAGNOSIS — I10 Essential (primary) hypertension: Secondary | ICD-10-CM | POA: Diagnosis not present

## 2018-02-21 DIAGNOSIS — Z716 Tobacco abuse counseling: Secondary | ICD-10-CM

## 2018-02-21 DIAGNOSIS — N951 Menopausal and female climacteric states: Secondary | ICD-10-CM

## 2018-02-21 DIAGNOSIS — Z23 Encounter for immunization: Secondary | ICD-10-CM

## 2018-02-21 DIAGNOSIS — Z1329 Encounter for screening for other suspected endocrine disorder: Secondary | ICD-10-CM

## 2018-02-21 DIAGNOSIS — Z131 Encounter for screening for diabetes mellitus: Secondary | ICD-10-CM

## 2018-02-21 DIAGNOSIS — F329 Major depressive disorder, single episode, unspecified: Secondary | ICD-10-CM

## 2018-02-21 DIAGNOSIS — G479 Sleep disorder, unspecified: Secondary | ICD-10-CM

## 2018-02-21 MED ORDER — ATORVASTATIN CALCIUM 20 MG PO TABS: 20.0000 mg | ORAL_TABLET | Freq: Every day | ORAL | 3 refills | Status: DC

## 2018-02-21 MED ORDER — VENLAFAXINE HCL 37.5 MG PO TABS: 37.5000 mg | ORAL_TABLET | Freq: Two times a day (BID) | ORAL | 0 refills | Status: DC
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
## 2018-02-21 NOTE — Patient Instructions (Addendum)
For your depression, sleep issues and hot flashes Start with Venlafaxine (Effexor) once a day for 3 days then increase to twice a day - once in the morning and once at bedtime   For cholesterol Atorvastatin 20mg   Take at bedtime to lower your cholesterol Add fish to your diet to improve your good cholesterol Add in 10-15 minutes of exercise daily to cut down belly fat      If you have lab work done today you will be contacted with your lab results within the next 2 weeks.  If you have not heard from [place] then please contact us. The fastest way to get your results is to register for My Chart.   IF you received an x-ray today, you will receive an invoice from [facility]. Please contact [facility] at [phone] with questions or concerns regarding your invoice.   IF you received labwork today, you will receive an invoice from [facility]. Please contact [facility] at [phone] with questions or concerns regarding your invoice.   Our billing staff will not be able to assist you with questions regarding bills from these companies.  You will be contacted with the lab results as soon as they are available. The fastest way to get your results is to activate your My Chart account. Instructions are located on the last page of this paperwork. If you have not heard from [place] regarding the results in 2 weeks, please contact this office.       Dyslipidemia Dyslipidemia is an imbalance of waxy, fat-like substances (lipids) in the blood. The body needs lipids in small amounts. Dyslipidemia often involves a high level of cholesterol or triglycerides, which are types of lipids. Common forms of dyslipidemia include:  High levels of bad cholesterol (LDL cholesterol). LDL is the type of cholesterol that causes fatty deposits (plaques) to build up in the blood vessels that carry blood away from your heart (arteries).  Low levels of good cholesterol (HDL cholesterol). HDL cholesterol is  the type of cholesterol that protects against heart disease. High levels of HDL remove the LDL buildup from arteries.  High levels of triglycerides. Triglycerides are a fatty substance in the blood that is linked to a buildup of plaques in the arteries.  You can develop dyslipidemia because of the genes you are born with (primary dyslipidemia) or changes that occur during your life (secondary dyslipidemia), or as a side effect of certain medical treatments. What are the causes? Primary dyslipidemia is caused by changes (mutations) in genes that are passed down through families (inherited). These mutations cause several types of dyslipidemia. Mutations can result in disorders that make the body produce too much LDL cholesterol or triglycerides, or not enough HDL cholesterol. These disorders may lead to heart disease, arterial disease, or stroke at an early age. Causes of secondary dyslipidemia include certain lifestyle choices and diseases that lead to dyslipidemia, such as:  Eating a diet that is high in animal fat.  Not getting enough activity or exercise (having a sedentary lifestyle).  Having diabetes, kidney disease, liver disease, or thyroid disease.  Drinking large amounts of alcohol.  Using certain types of drugs.  What increases the risk? You may be at greater risk for dyslipidemia if you are an older man or if you are a woman who has gone through menopause. Other risk factors include:  Having a family history of dyslipidemia.  Taking certain medicines, including birth control pills, steroids, some diuretics, beta-blockers, and some medicines forHIV.  Smoking cigarettes.  Eating a high-fat diet.  Drinking large amounts of alcohol.  Having certain medical conditions such as diabetes, polycystic ovary syndrome (PCOS), pregnancy, kidney disease, liver disease, or hypothyroidism.  Not exercising regularly.  Being overweight or obese with too much belly fat.  What are the  signs or symptoms? Dyslipidemia does not usually cause any symptoms. Very high lipid levels can cause fatty bumps under the skin (xanthomas) or a white or gray ring around the black center (pupil) of the eye. Very high triglyceride levels can cause inflammation of the pancreas (pancreatitis). How is this diagnosed? Your health care provider may diagnose dyslipidemia based on a routine blood test (fasting blood test). Because most people do not have symptoms of the condition, this blood testing (lipid profile) is done on adults age 4 and older and is repeated every 5 years. This test checks:  Total cholesterol. This is a measure of the total amount of cholesterol in your blood, including LDL cholesterol, HDL cholesterol, and triglycerides. A healthy number is below 200.  LDL cholesterol. The target number for LDL cholesterol is different for each person, depending on individual risk factors. For most people, a number below 100 is healthy. Ask your health care provider what your LDL cholesterol number should be.  HDL cholesterol. An HDL level of 60 or higher is best because it helps to protect against heart disease. A number below 35 for men or below 81 for women increases the risk for heart disease.  Triglycerides. A healthy triglyceride number is below 150.  If your lipid profile is abnormal, your health care provider may do other blood tests to get more information about your condition. How is this treated? Treatment depends on the type of dyslipidemia that you have and your other risk factors for heart disease and stroke. Your health care provider will have a target range for your lipid levels based on this information. For many people, treatment starts with lifestyle changes, such as diet and exercise. Your health care provider may recommend that you:  Get regular exercise.  Make changes to your diet.  Quit smoking if you smoke.  If diet changes and exercise do not help you reach your  goals, your health care provider may also prescribe medicine to lower lipids. The most commonly prescribed type of medicine lowers your LDL cholesterol (statin drug). If you have a high triglyceride level, your provider may prescribe another type of drug (fibrate) or an omega-3 fish oil supplement, or both. Follow these instructions at home:  Take over-the-counter and prescription medicines only as told by your health care provider. This includes supplements.  Get regular exercise. Start an aerobic exercise and strength training program as told by your health care provider. Ask your health care provider what activities are safe for you. Your health care provider may recommend: ? 30 minutes of aerobic activity 4-6 days a week. Brisk walking is an example of aerobic activity. ? Strength training 2 days a week.  Eat a healthy diet as told by your health care provider. This can help you reach and maintain a healthy weight, lower your LDL cholesterol, and raise your HDL cholesterol. It may help to work with a diet and nutrition specialist (dietitian) to make a plan that is right for you. Your dietitian or health care provider may recommend: ? Limiting your calories, if you are overweight. ? Eating more fruits, vegetables, whole grains, fish, and lean meats. ? Limiting saturated fat, trans fat, and cholesterol.  Follow instructions from your  health care provider or dietitian about eating or drinking restrictions.  Limit alcohol intake to no more than one drink per day for nonpregnant women and two drinks per day for men. One drink equals 12 oz of beer, 5 oz of wine, or 1 oz of hard liquor.  Do not use any products that contain nicotine or tobacco, such as cigarettes and e-cigarettes. If you need help quitting, ask your health care provider.  Keep all follow-up visits as told by your health care provider. This is important. Contact a health care provider if:  You are having trouble sticking to your  exercise or diet plan.  You are struggling to quit smoking or control your use of alcohol. Summary  Dyslipidemia is an imbalance of waxy, fat-like substances (lipids) in the blood. The body needs lipids in small amounts. Dyslipidemia often involves a high level of cholesterol or triglycerides, which are types of lipids.  Treatment depends on the type of dyslipidemia that you have and your other risk factors for heart disease and stroke.  For many people, treatment starts with lifestyle changes, such as diet and exercise. Your health care provider may also prescribe medicine to lower lipids. This information is not intended to replace advice given to you by your health care provider. Make sure you discuss any questions you have with your health care provider. Document Released: 04/13/2013 Document Revised: 12/04/2015 Document Reviewed: 12/04/2015 Elsevier Interactive Patient Education  [employer].

## 2018-02-22 LAB — LIPID PANEL
CHOLESTEROL TOTAL: 223 mg/dL — AB (ref 100–199)
Chol/HDL Ratio: 6.2 ratio — ABNORMAL HIGH (ref 0.0–4.4)
HDL: 36 mg/dL — AB (ref >39)
LDL CALC: 168 mg/dL — AB (ref 0–99)
Triglycerides: 97 mg/dL (ref 0–149)
VLDL CHOLESTEROL CAL: 19 mg/dL (ref 5–40)

## 2018-02-22 LAB — COMPREHENSIVE METABOLIC PANEL
A/G RATIO: 1.4 (ref 1.2–2.2)
ALBUMIN: 4 g/dL (ref 3.5–5.5)
ALT: 19 IU/L (ref 0–32)
AST: 15 IU/L (ref 0–40)
Alkaline Phosphatase: 114 IU/L (ref 39–117)
BUN / CREAT RATIO: 14 (ref 9–23)
BUN: 10 mg/dL (ref 6–24)
Bilirubin Total: <0.2 mg/dL (ref 0.0–1.2)
CALCIUM: 10.2 mg/dL (ref 8.7–10.2)
CO2: 22 mmol/L (ref 20–29)
CREATININE: 0.73 mg/dL (ref 0.57–1.00)
Chloride: 106 mmol/L (ref 96–106)
GFR, EST AFRICAN AMERICAN: 104 mL/min/{1.73_m2} (ref >59)
GFR, EST NON AFRICAN AMERICAN: 90 mL/min/{1.73_m2} (ref >59)
GLOBULIN, TOTAL: 2.8 g/dL (ref 1.5–4.5)
Glucose: 84 mg/dL (ref 65–99)
POTASSIUM: 4.2 mmol/L (ref 3.5–5.2)
SODIUM: 144 mmol/L (ref 134–144)
Total Protein: 6.8 g/dL (ref 6.0–8.5)

## 2018-02-22 LAB — HEMOGLOBIN A1C
ESTIMATED AVERAGE GLUCOSE: 111 mg/dL
Hgb A1c MFr Bld: 5.5 % (ref 4.8–5.6)

## 2018-02-22 LAB — TSH: TSH: 1.13 u[IU]/mL (ref 0.450–4.500)

## 2018-02-25 ENCOUNTER — Encounter: Payer: Self-pay | Admitting: Family Medicine

## 2018-02-25 DIAGNOSIS — I10 Essential (primary) hypertension: Secondary | ICD-10-CM

## 2018-02-25 DIAGNOSIS — E7800 Pure hypercholesterolemia, unspecified: Secondary | ICD-10-CM

## 2018-03-07 ENCOUNTER — Ambulatory Visit: Payer: BC Managed Care – PPO | Admitting: Family Medicine

## 2018-03-07 ENCOUNTER — Encounter

## 2018-03-28 ENCOUNTER — Encounter: Payer: Self-pay | Admitting: Family Medicine

## 2018-03-28 ENCOUNTER — Ambulatory Visit: Payer: BC Managed Care – PPO | Admitting: Family Medicine

## 2018-03-28 ENCOUNTER — Other Ambulatory Visit: Payer: Self-pay

## 2018-03-28 VITALS — BP 123/81 | HR 69 | Temp 97.9°F | Resp 14 | Ht 63.0 in | Wt 184.0 lb

## 2018-03-28 DIAGNOSIS — I10 Essential (primary) hypertension: Secondary | ICD-10-CM | POA: Diagnosis not present

## 2018-03-28 DIAGNOSIS — R4581 Low self-esteem: Secondary | ICD-10-CM

## 2018-03-28 DIAGNOSIS — F324 Major depressive disorder, single episode, in partial remission: Secondary | ICD-10-CM | POA: Diagnosis not present

## 2018-03-28 DIAGNOSIS — L819 Disorder of pigmentation, unspecified: Secondary | ICD-10-CM

## 2018-03-28 DIAGNOSIS — G4761 Periodic limb movement disorder: Secondary | ICD-10-CM

## 2018-03-28 DIAGNOSIS — E785 Hyperlipidemia, unspecified: Secondary | ICD-10-CM

## 2018-03-28 MED ORDER — VENLAFAXINE HCL 75 MG PO TABS: 75.0000 mg | ORAL_TABLET | Freq: Every day | ORAL | 1 refills | Status: DC

## 2018-03-28 NOTE — Patient Instructions (Signed)
° ° ° °  If you have lab work done today you will be contacted with your lab results within the next 2 weeks.  If you have not heard from us then please contact us. The fastest way to get your results is to register for My Chart. ° ° °IF you received an x-ray today, you will receive an invoice from [facility]. Please contact [facility] at [phone] with questions or concerns regarding your invoice.  ° °IF you received labwork today, you will receive an invoice from [facility]. Please contact [facility] at [phone] with questions or concerns regarding your invoice.  ° °Our billing staff will not be able to assist you with questions regarding bills from these companies. ° °You will be contacted with the lab results as soon as they are available. The fastest way to get your results is to activate your My Chart account. Instructions are located on the last page of this paperwork. If you have not heard from us regarding the results in 2 weeks, please contact this office. °  ° ° ° °
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
## 2018-03-29 DIAGNOSIS — L819 Disorder of pigmentation, unspecified: Secondary | ICD-10-CM | POA: Insufficient documentation

## 2018-03-29 DIAGNOSIS — G4761 Periodic limb movement disorder: Secondary | ICD-10-CM | POA: Insufficient documentation

## 2018-03-29 DIAGNOSIS — E785 Hyperlipidemia, unspecified: Secondary | ICD-10-CM | POA: Insufficient documentation

## 2018-03-29 DIAGNOSIS — R4581 Low self-esteem: Secondary | ICD-10-CM | POA: Insufficient documentation

## 2018-03-29 NOTE — Assessment & Plan Note (Signed)
Patient would probably benefit from Mirapex but she does not seem to notice any limb movement. She is mostly waking up with her thoughts.

## 2018-03-29 NOTE — Assessment & Plan Note (Signed)
Continue venlafaxine but once a day instead of bid. Discussed that venlafaxine can help with depression, anxiety and hot flashes. Since she gets drowsy with the medication she should continue it at bedtime.

## 2018-03-29 NOTE — Assessment & Plan Note (Signed)
Patient has HIGH LDL and LOW HDL. She will need an increased dose of statin but uncertain how compliance she is with her current dose. Will work with patient on improving her diet and if elevated next recheck will double her lipitor dose.

## 2018-03-29 NOTE — Assessment & Plan Note (Signed)
Discussed counseling. Reviewed criteria for bariatric surgery and discussed that lifestyle modification alone can change her BMI of 32. Discussed weight gain during menopause and ways to combat that. Discussed her skin and that due to the estrogen deficiency it might have more creases or sagging (neither of which are severe in her case) and that the cleansers are too harsh. Discussed that she does not feel good about herself and cannot see the positives. She is agreeable to counseling. Placed referral.

## 2018-03-29 NOTE — Assessment & Plan Note (Signed)
Discussed that some of the skin changes were due to diffuse photodamage and aging. She does not need to use harsh washes and alcohol astringents on her face. Discussed using cetaphil face wash and luke warm water and to use sunscreen. She lost her eye lashes when she was removing false eye lashes. She does not seem to have alopecia since she still has her upper eye lashes.

## 2018-03-29 NOTE — Assessment & Plan Note (Signed)
Patient's blood pressure is at goal of 139/89 or less. Condition is stable. Continue current medications and treatment plan. I recommend that you exercise for 30-45 minutes 5 days a week. I also recommend a balanced diet with fruits and vegetables every day, lean meats, and little fried foods. The DASH diet (you can find this online) is a good example of this.  

## 2018-05-13 ENCOUNTER — Ambulatory Visit (INDEPENDENT_AMBULATORY_CARE_PROVIDER_SITE_OTHER): Payer: BC Managed Care – PPO | Admitting: Psychology

## 2018-05-13 ENCOUNTER — Telehealth: Payer: Self-pay | Admitting: Family Medicine

## 2018-05-13 DIAGNOSIS — F332 Major depressive disorder, recurrent severe without psychotic features: Secondary | ICD-10-CM

## 2018-05-13 NOTE — Telephone Encounter (Signed)
See note. Per Urban Gibson will call pt on 05/14/2018 to schedule pt appt.

## 2018-05-13 NOTE — Telephone Encounter (Signed)
Copied from [place] #[id number]. Topic: General - Other >> May 13, 2018  3:11 PM Leward Quan A wrote: Patient called to say that Behavioral health told her to speak with Dr Nolon Rod about what to do for the future care. Need to talk about FMLA and other issues Ph# [phone]

## 2018-05-14 NOTE — Telephone Encounter (Signed)
Called and spoke with pt regarding scheduling an appt with Dr. Nolon Rod. PER WANDA, I scheduled a sameday appt on Monday 05/18/18 at 1:40 with Dr. Nolon Rod. I advised of time, building number and late policy. Pt acknowledged.

## 2018-05-18 ENCOUNTER — Ambulatory Visit: Payer: BC Managed Care – PPO | Admitting: Family Medicine

## 2018-05-18 ENCOUNTER — Encounter: Payer: Self-pay | Admitting: Family Medicine

## 2018-05-18 ENCOUNTER — Other Ambulatory Visit: Payer: Self-pay

## 2018-05-18 VITALS — BP 133/86 | HR 83 | Temp 98.6°F | Resp 17 | Ht 63.0 in | Wt 182.8 lb

## 2018-05-18 DIAGNOSIS — F332 Major depressive disorder, recurrent severe without psychotic features: Secondary | ICD-10-CM | POA: Diagnosis not present

## 2018-05-18 DIAGNOSIS — R45851 Suicidal ideations: Secondary | ICD-10-CM | POA: Diagnosis not present

## 2018-05-18 DIAGNOSIS — F419 Anxiety disorder, unspecified: Secondary | ICD-10-CM
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
## 2018-05-18 NOTE — Patient Instructions (Addendum)
[facility] Address: [address], [place], [place] [address]  Phone: [phone] A nonprofit organization supporting thousands in [place] with intellectual and developmental disabilities, mental illness, and substance use disorders.   [facility] [place] in services available  Monday through Friday  or call for an appointment 8:30 a.m. to 12:00 p.m. ([place] and [place]) 1:00 p.m. to 2:30 p.m. ([place] only) 2:00 p.m. to 3:30 p.m. [facility] only) [phone] [phone]  For more information email: [email or web address]     If you have lab work done today you will be contacted with your lab results within the next 2 weeks.  If you have not heard from [place] then please contact us. The fastest way to get your results is to register for My Chart.   IF you received an x-ray today, you will receive an invoice from [facility]. Please contact [facility] at [phone] with questions or concerns regarding your invoice.   IF you received labwork today, you will receive an invoice from [place]. Please contact [facility] at [phone] with questions or concerns regarding your invoice.   Our billing staff will not be able to assist you with questions regarding bills from these companies.  You will be contacted with the lab results as soon as they are available. The fastest way to get your results is to activate your My Chart account. Instructions are located on the last page of this paperwork. If you have not heard from [place] regarding the results in 2 weeks, please contact this office.

## 2018-05-19 ENCOUNTER — Telehealth: Payer: Self-pay | Admitting: Family Medicine

## 2018-05-19 NOTE — Telephone Encounter (Signed)
Copied from [place] [id number]. Topic: Quick Communication - See Telephone Encounter >> May 19, 2018 11:26 AM Rutherford Nail, NT wrote: CRM for notification. See Telephone encounter for: 05/19/18. Patient calling and states that Dr Nolon Rod had written her out of work effective today (05/19/2018) and she is to see a counselor or psychiatrist in 2 weeks. States that the only availability is on 06/01/2018. Patient would like to know if this take is okay since it is longer than the 2 weeks that Dr Nolon Rod requested? CB#: [phone]

## 2018-05-20 DIAGNOSIS — Z0271 Encounter for disability determination: Secondary | ICD-10-CM

## 2018-05-20 NOTE — Telephone Encounter (Signed)
Patient came in today wanting to speak with Dr. Nolon Rod. Let her know that I put in a message. She would like a call back as soon as possible

## 2018-05-21 NOTE — Telephone Encounter (Signed)
Copied from [place] [id number]. Topic: General - Other >> May 20, 2018  5:20 PM Yvette Rack wrote: Reason for CRM: Pt stated she came in to the office today to speak with Dr. Nolon Rod and she has not heard back. Pt stated that it is very important that she speak with Dr. Nolon Rod. Pt requests call back. Cb# [phone]

## 2018-05-22 ENCOUNTER — Ambulatory Visit (INDEPENDENT_AMBULATORY_CARE_PROVIDER_SITE_OTHER): Payer: BC Managed Care – PPO | Admitting: Psychology

## 2018-05-22 DIAGNOSIS — F332 Major depressive disorder, recurrent severe without psychotic features: Secondary | ICD-10-CM | POA: Diagnosis not present

## 2018-05-22 NOTE — Telephone Encounter (Signed)
Please advise 

## 2018-05-26 ENCOUNTER — Other Ambulatory Visit (HOSPITAL_COMMUNITY): Payer: BC Managed Care – PPO | Attending: Psychiatry | Admitting: Licensed Clinical Social Worker

## 2018-05-26 DIAGNOSIS — F332 Major depressive disorder, recurrent severe without psychotic features: Secondary | ICD-10-CM | POA: Diagnosis present

## 2018-05-26 DIAGNOSIS — R45851 Suicidal ideations: Secondary | ICD-10-CM | POA: Insufficient documentation
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
## 2018-05-27 ENCOUNTER — Ambulatory Visit (HOSPITAL_COMMUNITY)
Admission: RE | Admit: 2018-05-27 | Discharge: 2018-05-27 | Disposition: A | Discharge status: Home / Self Care | Payer: BC Managed Care – PPO | Attending: Psychiatry | Admitting: Psychiatry

## 2018-06-01 ENCOUNTER — Ambulatory Visit: Payer: BC Managed Care – PPO | Admitting: Psychology

## 2018-06-03 ENCOUNTER — Encounter: Payer: Self-pay | Admitting: Family Medicine

## 2018-06-03 ENCOUNTER — Other Ambulatory Visit: Payer: Self-pay

## 2018-06-03 ENCOUNTER — Ambulatory Visit: Payer: BC Managed Care – PPO | Admitting: Family Medicine

## 2018-06-03 VITALS — BP 131/84 | HR 76 | Temp 97.8°F | Resp 17 | Ht 63.0 in | Wt 181.8 lb

## 2018-06-03 DIAGNOSIS — F332 Major depressive disorder, recurrent severe without psychotic features: Secondary | ICD-10-CM | POA: Diagnosis not present

## 2018-06-03 DIAGNOSIS — F324 Major depressive disorder, single episode, in partial remission: Secondary | ICD-10-CM

## 2018-06-03 DIAGNOSIS — K635 Polyp of colon: Secondary | ICD-10-CM

## 2018-06-03 DIAGNOSIS — F419 Anxiety disorder, unspecified: Secondary | ICD-10-CM

## 2018-06-03 MED ORDER — VENLAFAXINE HCL 75 MG PO TABS: 75.0000 mg | ORAL_TABLET | Freq: Two times a day (BID) | ORAL | 1 refills | Status: DC
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
## 2018-06-03 NOTE — Patient Instructions (Signed)
° ° ° °  If you have lab work done today you will be contacted with your lab results within the next 2 weeks.  If you have not heard from us then please contact us. The fastest way to get your results is to register for My Chart. ° ° °IF you received an x-ray today, you will receive an invoice from [facility]. Please contact [facility] at [phone] with questions or concerns regarding your invoice.  ° °IF you received labwork today, you will receive an invoice from [facility]. Please contact [facility] at [phone] with questions or concerns regarding your invoice.  ° °Our billing staff will not be able to assist you with questions regarding bills from these companies. ° °You will be contacted with the lab results as soon as they are available. The fastest way to get your results is to activate your My Chart account. Instructions are located on the last page of this paperwork. If you have not heard from us regarding the results in 2 weeks, please contact this office. °  ° ° ° °

## 2018-06-16 ENCOUNTER — Encounter: Payer: Self-pay | Admitting: Family Medicine

## 2018-06-16 ENCOUNTER — Ambulatory Visit: Payer: BC Managed Care – PPO | Admitting: Family Medicine

## 2018-06-16 VITALS — BP 136/76 | HR 73 | Temp 97.7°F | Ht 63.0 in | Wt 186.6 lb

## 2018-06-16 DIAGNOSIS — I10 Essential (primary) hypertension: Secondary | ICD-10-CM

## 2018-06-16 DIAGNOSIS — E785 Hyperlipidemia, unspecified: Secondary | ICD-10-CM | POA: Diagnosis not present

## 2018-06-16 DIAGNOSIS — F332 Major depressive disorder, recurrent severe without psychotic features: Secondary | ICD-10-CM

## 2018-06-16 DIAGNOSIS — F419 Anxiety disorder, unspecified: Secondary | ICD-10-CM | POA: Diagnosis not present
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
## 2018-06-16 NOTE — Patient Instructions (Addendum)
[facility] Address: [address], [place], [place] [address]  Phone: [phone] A nonprofit organization supporting thousands in [place] with intellectual and developmental disabilities, mental illness, and substance use disorders.    [facility] [place] in services available  Monday through Friday  or call for an appointment 8:30 a.m. to 12:00 p.m. ([place] and [place]) 1:00 p.m. to 2:30 p.m. ([place] only) 2:00 p.m. to 3:30 p.m. [facility] only) [phone] [phone]  For more information email: [email or web address]     If you have lab work done today you will be contacted with your lab results within the next 2 weeks.  If you have not heard from [place] then please contact us. The fastest way to get your results is to register for My Chart.   IF you received an x-ray today, you will receive an invoice from [facility]. Please contact [facility] at [phone] with questions or concerns regarding your invoice.   IF you received labwork today, you will receive an invoice from [facility]. Please contact [facility] at [phone] with questions or concerns regarding your invoice.   Our billing staff will not be able to assist you with questions regarding bills from these companies.  You will be contacted with the lab results as soon as they are available. The fastest way to get your results is to activate your My Chart account. Instructions are located on the last page of this paperwork. If you have not heard from [place] regarding the results in 2 weeks, please contact this office.

## 2018-06-29 ENCOUNTER — Telehealth: Payer: Self-pay

## 2018-06-29 NOTE — Telephone Encounter (Signed)
Pt is requesting medical record for OV on 02/25.  Does not want medical history listed on it.  States she has requested this previously, the one copy she received has too much personal info on it.  Obtained copy of request from carrier.  Needs faxed to [phone].

## 2018-06-30 NOTE — Telephone Encounter (Signed)
Pt called to check status. Please advise. Pt seems upset.

## 2018-07-01 NOTE — Telephone Encounter (Signed)
I faxed ov notes to # given and got a confirmtion. This is a disability issue.

## 2018-07-01 NOTE — Telephone Encounter (Signed)
Please advise 

## 2018-07-13 ENCOUNTER — Telehealth (INDEPENDENT_AMBULATORY_CARE_PROVIDER_SITE_OTHER): Payer: BC Managed Care – PPO | Admitting: Family Medicine

## 2018-07-13 ENCOUNTER — Telehealth: Payer: Self-pay

## 2018-07-13 ENCOUNTER — Ambulatory Visit: Payer: BC Managed Care – PPO | Admitting: Family Medicine

## 2018-07-13 DIAGNOSIS — J449 Chronic obstructive pulmonary disease, unspecified: Secondary | ICD-10-CM

## 2018-07-13 DIAGNOSIS — Z1211 Encounter for screening for malignant neoplasm of colon: Secondary | ICD-10-CM

## 2018-07-13 DIAGNOSIS — F324 Major depressive disorder, single episode, in partial remission: Secondary | ICD-10-CM

## 2018-07-13 DIAGNOSIS — I10 Essential (primary) hypertension: Secondary | ICD-10-CM | POA: Diagnosis not present

## 2018-07-13 MED ORDER — VENLAFAXINE HCL 100 MG PO TABS: 100.0000 mg | ORAL_TABLET | Freq: Two times a day (BID) | ORAL | 6 refills | Status: DC

## 2018-07-13 MED ORDER — ALBUTEROL SULFATE HFA 108 (90 BASE) MCG/ACT IN AERS: 2.0000 | INHALATION_SPRAY | Freq: Four times a day (QID) | RESPIRATORY_TRACT | 6 refills | Status: DC | PRN

## 2018-07-13 MED ORDER — HYDROCHLOROTHIAZIDE 25 MG PO TABS: 25.0000 mg | ORAL_TABLET | Freq: Every day | ORAL | 1 refills | Status: DC

## 2018-07-13 NOTE — Telephone Encounter (Signed)
Pt is arrived for telemed visit 8:40

## 2018-07-13 NOTE — Progress Notes (Signed)
Virtual Visit via Telephone Note  I connected with Elizabeth Best on 07/13/18 at  8:40 AM EDT by telephone and verified that I am speaking with the correct person using two identifiers.   I discussed the limitations, risks, security and privacy concerns of performing an evaluation and management service by telephone and the availability of in person appointments. I also discussed with the patient that there may be a patient responsible charge related to this service. The patient expressed understanding and agreed to proceed.  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  She is staying inside due to coronavirus and is stress eating Depression screen [facility] 2/9 07/13/2018 06/16/2018 06/03/2018 05/18/2018 03/28/2018  Decreased Interest 2 3 0 3 2  Down, Depressed, Hopeless 2 2 0 3 2  PHQ - 2 Score 4 5 0 6 4  Altered sleeping 3 3 - 3 2  Tired, decreased energy 3 3 - 2 1  Change in appetite 0 1 - 1 0  Feeling bad or failure about yourself  1 3 - 3 1  Trouble concentrating 1 2 - 2 0  Moving slowly or fidgety/restless 1 2 - 3 0  Suicidal thoughts 1 2 - 3 0  PHQ-9 Score 14 21 - 23 8  Difficult doing work/chores Somewhat difficult Very difficult - Very difficult Somewhat difficult  Some recent data might be hidden    She is avoiding drinking coffee late in the day She states her mind is always going and it makes it difficult to get sleep She states that she is taking the Effexor 75mg  bid She reports that she feels like the isolation is not helping her and she lives alone.  She reports that she cannot tell if the depression is improving due to the circumstances with the coronavirus. She denies suicidal thoughts. She states that she is more worried  about finances about how she will be able to afford to live. She has a sponsor that she can talk to.   Mild COPD She reports that she was prescribed albuterol for mild COPD 2-3 years ago She states that due to stress and isolation she has been smoking more 3/4 pack per day and has been wheezing and has been out of her albuterol for months.  She states that she is not coughing. Her symptoms are worse in the day time.  Colon Cancer Screening She had a colonoscopy and was told she had polyps She denies blood in her stool, unexpected weight loss or pain with defecation No rectal itching She is a smoker She does not have a family history of colon cancer She cancelled her appt with Dr. Collene Mares due to the cost of the office visit of $100  Observations/Objective: There were no vitals filed for this visit.   Assessment and Plan: Diagnoses and all orders for this visit:  COPD, mild ([place])-  Discussed heavy smoking, will increase Effexor to help mood  -     albuterol (PROVENTIL HFA;VENTOLIN HFA) 108 (90 Base) MCG/ACT inhaler; Inhale 2 puffs into the lungs every 6 (six) hours as needed for wheezing or shortness of breath.  Major depressive disorder with single episode, in partial remission ([place])- given stressors and continued depression symptoms will treat with increasing  dose of Effexor Discussed calling hotlines Staying close to family by phone -     venlafaxine (EFFEXOR) 100 MG tablet; Take 1 tablet (100 mg total) by mouth 2 (two) times daily with a meal.  Special screening for malignant neoplasms, colon- discussed screening options -     Cologuard  Essential hypertension- stable, cpm  Other orders -     hydrochlorothiazide (HYDRODIURIL) 25 MG tablet; Take 1 tablet (25 mg total) by mouth daily.     Follow Up Instructions:    I discussed the assessment and treatment plan with the patient. The patient was provided an opportunity to ask questions and all were answered. The patient agreed  with the plan and demonstrated an understanding of the instructions.   The patient was advised to call back or seek an in-person evaluation if the symptoms worsen or if the condition fails to improve as anticipated.   End time 9:34am  I provided 23 minutes of non-face-to-face time during this encounter.   Forrest Moron, MD

## 2018-07-13 NOTE — Telephone Encounter (Signed)
Call to pt re letter for employer.  Pt requests that two copies be left for pick up.  Placed in folder at front desk.

## 2018-07-14 ENCOUNTER — Other Ambulatory Visit: Payer: Self-pay | Admitting: Family Medicine

## 2018-07-14 DIAGNOSIS — J449 Chronic obstructive pulmonary disease, unspecified: Secondary | ICD-10-CM

## 2018-07-14 DIAGNOSIS — F324 Major depressive disorder, single episode, in partial remission: Secondary | ICD-10-CM

## 2018-07-14 MED ORDER — VENLAFAXINE HCL 100 MG PO TABS: 100.0000 mg | ORAL_TABLET | Freq: Two times a day (BID) | ORAL | 6 refills | Status: DC

## 2018-07-14 MED ORDER — ALBUTEROL SULFATE HFA 108 (90 BASE) MCG/ACT IN AERS: 2.0000 | INHALATION_SPRAY | Freq: Four times a day (QID) | RESPIRATORY_TRACT | 6 refills | Status: DC | PRN

## 2018-07-14 MED ORDER — HYDROCHLOROTHIAZIDE 25 MG PO TABS: 25.0000 mg | ORAL_TABLET | Freq: Every day | ORAL | 1 refills | Status: DC

## 2018-07-14 NOTE — Telephone Encounter (Signed)
Patient called to say that her albuterol, effexor and hydrochlorothiazide rx was not sent to pharmacy.  Per note patient to be taking medications.  Refills sent to [facility].  Patient aware.

## 2018-09-10 ENCOUNTER — Telehealth: Payer: Self-pay | Admitting: Family Medicine

## 2018-09-10 ENCOUNTER — Ambulatory Visit: Payer: Self-pay

## 2018-09-10 NOTE — Telephone Encounter (Signed)
Pt called to say that she has had fever 102 and a severe HA since Tuesday. She is unsure of any exposure to the COVID-19 but states that her friends boyfriend tested positive. She has not been in the company of the boyfriend. Her friend is not sick. She states that she does not have breathing issues.  She has a dry cough. Care instructions read to patient. Patient verbalized understanding of all instructions. Call transferred to office for appointment scheduling.  Reason for Disposition . [1] Fever > 101 F (38.3 C) AND [2] age > 5  Answer Assessment - Initial Assessment Questions 1. COVID-19 DIAGNOSIS: "Who made your Coronavirus (COVID-19) diagnosis?" "Was it confirmed by a positive lab test?" If not diagnosed by a HCP, ask "Are there lots of cases (community spread) where you live?" (See public health department website, if unsure)   * MAJOR community spread: high number of cases; numbers of cases are increasing; many people hospitalized.   * MINOR community spread: low number of cases; not increasing; few or no people hospitalized    [place] 2. ONSET: "When did the COVID-19 symptoms start?"      Tuesday 3. WORST SYMPTOM: "What is your worst symptom?" (e.g., cough, fever, shortness of breath, muscle aches)     Fever Headache 4. COUGH: "Do you have a cough?" If so, ask: "How bad is the cough?"       Cough is dry 5. FEVER: "Do you have a fever?" If so, ask: "What is your temperature, how was it measured, and when did it start?"     102  two days ago and all night last night 6. RESPIRATORY STATUS: "Describe your breathing?" (e.g., shortness of breath, wheezing, unable to speak)     No problem 7. BETTER-SAME-WORSE: "Are you getting better, staying the same or getting worse compared to yesterday?"  If getting worse, ask, "In what way?"     same 8. HIGH RISK DISEASE: "Do you have any chronic medical problems?" (e.g., asthma, heart or lung disease, weak immune system, etc.)    None 9. PREGNANCY:  "Is there any chance you are pregnant?" "When was your last menstrual period?"    N/A 10. OTHER SYMPTOMS: "Do you have any other symptoms?"  (e.g., runny nose, headache, sore throat, loss of smell)       Headache,  Protocols used: CORONAVIRUS (COVID-19) DIAGNOSED OR SUSPECTED-A-AH

## 2018-09-10 NOTE — Telephone Encounter (Signed)
Pt needs a virtual/ tele appt for covid exposure

## 2018-09-10 NOTE — Telephone Encounter (Signed)
Has not received the cologuard, will follow up with lab

## 2018-09-10 NOTE — Telephone Encounter (Signed)
Copied from [place] [id number]. Topic: General - Other >> Sep 10, 2018 10:15 AM Rayann Heman wrote: Reason for CRM: pt called and stated that she has not received cologuard. Pt would like a call back regarding. Please advise

## 2018-09-11 NOTE — Telephone Encounter (Signed)
The order for the original cologuard was not processed, pt is ok with getting colonoscopy. She feels if the package comes to her door, someone may take it if she is not there to retrieve

## 2018-09-22 NOTE — Telephone Encounter (Signed)
Please give her the number to Dr. Lorie Apley office. The referral is in the system and is still active so she can go in for colonoscopy now that they are open

## 2018-09-22 NOTE — Telephone Encounter (Signed)
Per [facility] left detailed message referral in system and is active for pt to go for colonoscopy now that they are open and Dr. Lorie Apley phone # left with message as well for pt to call and setup.  Advised to call Britt Boozer) with any further questions or concerns. Dgaddy, CMA

## 2018-10-19 ENCOUNTER — Other Ambulatory Visit: Payer: Self-pay | Admitting: Family Medicine

## 2018-10-19 DIAGNOSIS — Z1231 Encounter for screening mammogram for malignant neoplasm of breast: Secondary | ICD-10-CM

## 2018-11-25 ENCOUNTER — Other Ambulatory Visit: Payer: Self-pay

## 2018-11-25 DIAGNOSIS — Z20822 Contact with and (suspected) exposure to covid-19: Secondary | ICD-10-CM

## 2018-11-26 LAB — NOVEL CORONAVIRUS, NAA: SARS-CoV-2, NAA: NOT DETECTED

## 2018-11-28 ENCOUNTER — Ambulatory Visit (HOSPITAL_COMMUNITY)
Admission: EM | Admit: 2018-11-28 | Discharge: 2018-11-28 | Disposition: A | Discharge status: Home / Self Care | Payer: BC Managed Care – PPO | Attending: Family Medicine | Admitting: Family Medicine

## 2018-11-28 ENCOUNTER — Encounter (HOSPITAL_COMMUNITY): Payer: Self-pay

## 2018-11-28 ENCOUNTER — Other Ambulatory Visit: Payer: Self-pay

## 2018-11-28 DIAGNOSIS — N9089 Other specified noninflammatory disorders of vulva and perineum: Secondary | ICD-10-CM

## 2018-11-28 MED ORDER — POVIDONE-IODINE 10 % EX SOLN
CUTANEOUS | Status: AC
  Filled 2018-11-28: qty 118

## 2018-11-28 NOTE — Discharge Instructions (Signed)
Hold pressure for bleeding to stop keep clean and dry  Follow up if developing signs of infection- increased redness, swelling, drainage

## 2018-11-28 NOTE — ED Triage Notes (Signed)
Patient presents to Urgent Care with complaints of a wound in her groin that is painful since several weeks ago. Patient reports due to the location of the wound she cannot see it, but does not think it is open or draining, it is uncomfortable for her to walk or wear underwear.
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
## 2018-11-30 ENCOUNTER — Ambulatory Visit: Payer: BC Managed Care – PPO

## 2019-01-01 ENCOUNTER — Ambulatory Visit
Admission: RE | Admit: 2019-01-01 | Discharge: 2019-01-01 | Disposition: A | Discharge status: Home / Self Care | Payer: BC Managed Care – PPO | Source: Ambulatory Visit | Attending: Family Medicine | Admitting: Family Medicine

## 2019-01-01 ENCOUNTER — Other Ambulatory Visit: Payer: Self-pay

## 2019-01-01 DIAGNOSIS — Z1231 Encounter for screening mammogram for malignant neoplasm of breast: Secondary | ICD-10-CM

## 2019-05-16 ENCOUNTER — Other Ambulatory Visit: Payer: Self-pay | Admitting: Family Medicine

## 2019-05-16 NOTE — Telephone Encounter (Signed)
Requested medication (s) are due for refill today: yes  Requested medication (s) are on the active medication list: yes  Last refill:  07/14/18  Future visit scheduled: no  Notes to clinic:  Overdue for an appointment sending to office to review.    Requested Prescriptions  Pending Prescriptions Disp Refills   hydrochlorothiazide (HYDRODIURIL) 25 MG tablet [Pharmacy Med Name: hydroCHLOROthiazide 25 MG Oral Tablet] 90 tablet 0    Sig: Take 1 tablet by mouth once daily      Cardiovascular: Diuretics - Thiazide Failed - 05/16/2019 11:06 AM      Failed - Ca in normal range and within 360 days    Calcium  Date Value Ref Range Status  02/21/2018 10.2 8.7 - 10.2 mg/dL Final   Calcium, Ion  Date Value Ref Range Status  09/01/2012 1.24 (H) 1.12 - 1.23 mmol/L Final          Failed - Cr in normal range and within 360 days    Creat  Date Value Ref Range Status  12/27/2015 0.74 0.50 - 1.05 mg/dL Final    Comment:      For patients > or = 62 years of age: The upper reference limit for Creatinine is approximately 13% higher for people identified as African-American.      Creatinine, Ser  Date Value Ref Range Status  02/21/2018 0.73 0.57 - 1.00 mg/dL Final          Failed - K in normal range and within 360 days    Potassium  Date Value Ref Range Status  02/21/2018 4.2 3.5 - 5.2 mmol/L Final          Failed - Na in normal range and within 360 days    Sodium  Date Value Ref Range Status  02/21/2018 144 134 - 144 mmol/L Final          Failed - Valid encounter within last 6 months    Recent Outpatient Visits           10 months ago COPD, mild ([place])   Primary Care at [facility], Zoe A, MD   11 months ago Severe recurrent major depression without psychotic features [facility])   Primary Care at [facility], Zoe A, MD   11 months ago Severe recurrent major depression without psychotic features [facility])   Primary Care at [facility], Zoe A, MD   12 months ago  Severe recurrent major depression without psychotic features [facility])   Primary Care at [facility], Arlie Solomons, MD   1 year ago Essential hypertension   Primary Care at [place], MD              Passed - Last BP in normal range    BP Readings from Last 1 Encounters:  11/28/18 126/83

## 2019-05-19 NOTE — Telephone Encounter (Signed)
Please schedule appt for med refills. Ok to be Tele-med visit

## 2019-05-21 NOTE — Telephone Encounter (Signed)
Pt called back and  Scheduled appt

## 2019-05-25 ENCOUNTER — Encounter: Payer: Self-pay | Admitting: Family Medicine

## 2019-05-25 ENCOUNTER — Ambulatory Visit (INDEPENDENT_AMBULATORY_CARE_PROVIDER_SITE_OTHER): Payer: BC Managed Care – PPO | Admitting: Family Medicine

## 2019-05-25 ENCOUNTER — Other Ambulatory Visit: Payer: Self-pay

## 2019-05-25 VITALS — BP 152/84 | HR 77 | Temp 97.9°F | Resp 17 | Ht 63.0 in | Wt 190.2 lb

## 2019-05-25 DIAGNOSIS — I10 Essential (primary) hypertension: Secondary | ICD-10-CM

## 2019-05-25 DIAGNOSIS — E785 Hyperlipidemia, unspecified: Secondary | ICD-10-CM

## 2019-05-25 DIAGNOSIS — Z122 Encounter for screening for malignant neoplasm of respiratory organs: Secondary | ICD-10-CM

## 2019-05-25 DIAGNOSIS — E7800 Pure hypercholesterolemia, unspecified: Secondary | ICD-10-CM

## 2019-05-25 DIAGNOSIS — F324 Major depressive disorder, single episode, in partial remission: Secondary | ICD-10-CM

## 2019-05-25 DIAGNOSIS — J449 Chronic obstructive pulmonary disease, unspecified: Secondary | ICD-10-CM | POA: Diagnosis not present

## 2019-05-25 MED ORDER — ATORVASTATIN CALCIUM 20 MG PO TABS: 20.0000 mg | ORAL_TABLET | Freq: Every day | ORAL | 3 refills | Status: DC

## 2019-05-25 MED ORDER — GABAPENTIN 100 MG PO CAPS: 100.0000 mg | ORAL_CAPSULE | Freq: Every day | ORAL | 1 refills | Status: DC

## 2019-05-25 MED ORDER — VENLAFAXINE HCL 100 MG PO TABS: 100.0000 mg | ORAL_TABLET | Freq: Two times a day (BID) | ORAL | 6 refills | Status: DC

## 2019-05-25 MED ORDER — HYDROCHLOROTHIAZIDE 25 MG PO TABS: 25.0000 mg | ORAL_TABLET | Freq: Every day | ORAL | 1 refills | Status: DC

## 2019-05-25 MED ORDER — ALBUTEROL SULFATE HFA 108 (90 BASE) MCG/ACT IN AERS: 2.0000 | INHALATION_SPRAY | Freq: Four times a day (QID) | RESPIRATORY_TRACT | 6 refills | Status: DC | PRN
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
## 2019-05-25 NOTE — Patient Instructions (Addendum)
If you have lab work done today you will be contacted with your lab results within the next 2 weeks.  If you have not heard from [place] then please contact us. The fastest way to get your results is to register for My Chart.   IF you received an x-ray today, you will receive an invoice from [facility]. Please contact [facility] at [phone] with questions or concerns regarding your invoice.   IF you received labwork today, you will receive an invoice from [place]. Please contact [facility] at [phone] with questions or concerns regarding your invoice.   Our billing staff will not be able to assist you with questions regarding bills from these companies.  You will be contacted with the lab results as soon as they are available. The fastest way to get your results is to activate your My Chart account. Instructions are located on the last page of this paperwork. If you have not heard from [place] regarding the results in 2 weeks, please contact this office.     COPD and Physical Activity Chronic obstructive pulmonary disease (COPD) is a long-term (chronic) condition that affects the lungs. COPD is a general term that can be used to describe many different lung problems that cause lung swelling (inflammation) and limit airflow, including chronic bronchitis and emphysema. The main symptom of COPD is shortness of breath, which makes it harder to do even simple tasks. This can also make it harder to exercise and be active. Talk with your health care provider about treatments to help you breathe better and actions you can take to prevent breathing problems during physical activity. What are the benefits of exercising with COPD? Exercising regularly is an important part of a healthy lifestyle. You can still exercise and do physical activities even though you have COPD. Exercise and physical activity improve your shortness of breath by increasing blood flow (circulation). This causes  your heart to pump more oxygen through your body. Moderate exercise can improve your:  Oxygen use.  Energy level.  Shortness of breath.  Strength in your breathing muscles.  Heart health.  Sleep.  Self-esteem and feelings of self-worth.  Depression, stress, and anxiety levels. Exercise can benefit everyone with COPD. The severity of your disease may affect how hard you can exercise, especially at first, but everyone can benefit. Talk with your health care provider about how much exercise is safe for you, and which activities and exercises are safe for you. What actions can I take to prevent breathing problems during physical activity?  Sign up for a pulmonary rehabilitation program. This type of program may include: ? Education about lung diseases. ? Exercise classes that teach you how to exercise and be more active while improving your breathing. This usually involves:  Exercise using your lower extremities, such as a stationary bicycle.  About 30 minutes of exercise, 2 to 5 times per week, for 6 to 12 weeks  Strength training, such as push ups or leg lifts. ? Nutrition education. ? Group classes in which you can talk with others who also have COPD and learn ways to manage stress.  If you use an oxygen tank, you should use it while you exercise. Work with your health care provider to adjust your oxygen for your physical activity. Your resting flow rate is different from your flow rate during physical activity.  While you are exercising: ? Take slow breaths. ? Pace yourself and do not try to go too fast. ? Purse  your lips while breathing out. Pursing your lips is similar to a kissing or whistling position. ? If doing exercise that uses a quick burst of effort, such as weight lifting:  Breathe in before starting the exercise.  Breathe out during the hardest part of the exercise (such as raising the weights). Where to find support You can find support for exercising with  COPD from:  Your health care provider.  A pulmonary rehabilitation program.  Your local health department or community health programs.  Support groups, online or in-person. Your health care provider may be able to recommend support groups. Where to find more information You can find more information about exercising with COPD from:  American Lung Association: ClassInsider.se.  COPD Foundation: https://www.rivera.net/. Contact a health care provider if:  Your symptoms get worse.  You have chest pain.  You have nausea.  You have a fever.  You have trouble talking or catching your breath.  You want to start a new exercise program or a new activity. Summary  COPD is a general term that can be used to describe many different lung problems that cause lung swelling (inflammation) and limit airflow. This includes chronic bronchitis and emphysema.  Exercise and physical activity improve your shortness of breath by increasing blood flow (circulation). This causes your heart to provide more oxygen to your body.  Contact your health care provider before starting any exercise program or new activity. Ask your health care provider what exercises and activities are safe for you. This information is not intended to replace advice given to you by your health care provider. Make sure you discuss any questions you have with your health care provider. Document Revised: 07/29/2018 Document Reviewed: 05/01/2017 Elsevier Patient Education  2020 [employer].

## 2019-05-26 LAB — COMPREHENSIVE METABOLIC PANEL
ALT: 18 IU/L (ref 0–32)
AST: 15 IU/L (ref 0–40)
Albumin/Globulin Ratio: 1.7 (ref 1.2–2.2)
Albumin: 4.5 g/dL (ref 3.8–4.8)
Alkaline Phosphatase: 124 IU/L — ABNORMAL HIGH (ref 39–117)
BUN/Creatinine Ratio: 20 (ref 12–28)
BUN: 12 mg/dL (ref 8–27)
Bilirubin Total: <0.2 mg/dL (ref 0.0–1.2)
CO2: 24 mmol/L (ref 20–29)
Calcium: 11.1 mg/dL — ABNORMAL HIGH (ref 8.7–10.3)
Chloride: 102 mmol/L (ref 96–106)
Creatinine, Ser: 0.6 mg/dL (ref 0.57–1.00)
GFR calc Af Amer: 114 mL/min/{1.73_m2} (ref >59)
GFR calc non Af Amer: 99 mL/min/{1.73_m2} (ref >59)
Globulin, Total: 2.7 g/dL (ref 1.5–4.5)
Glucose: 95 mg/dL (ref 65–99)
Potassium: 3.8 mmol/L (ref 3.5–5.2)
Sodium: 141 mmol/L (ref 134–144)
Total Protein: 7.2 g/dL (ref 6.0–8.5)

## 2019-05-26 LAB — LIPID PANEL
Chol/HDL Ratio: 6.5 ratio — ABNORMAL HIGH (ref 0.0–4.4)
Cholesterol, Total: 226 mg/dL — ABNORMAL HIGH (ref 100–199)
HDL: 35 mg/dL — ABNORMAL LOW (ref >39)
LDL Chol Calc (NIH): 156 mg/dL — ABNORMAL HIGH (ref 0–99)
Triglycerides: 190 mg/dL — ABNORMAL HIGH (ref 0–149)
VLDL Cholesterol Cal: 35 mg/dL (ref 5–40)

## 2019-05-26 MED ORDER — COLESEVELAM HCL 625 MG PO TABS: 625.0000 mg | ORAL_TABLET | Freq: Two times a day (BID) | ORAL | 3 refills | Status: DC

## 2019-06-02 ENCOUNTER — Ambulatory Visit: Payer: Self-pay

## 2019-06-02 NOTE — Telephone Encounter (Signed)
Pt. Reports she was at work today and had a couple of episodes of dizziness. Was walking and had to lean against the wall until it passed. Reports she has had vertigo in the past "and this is different." Episodes lasted a few seconds. No other symptoms. Reports she has been tired and not sleeping well. Has started 2 new medications as well. Warm transfer to [facility] in the practice for a visit.  Answer Assessment - Initial Assessment Questions 1. DESCRIPTION: "Describe your dizziness."     Lightheadeness 2. LIGHTHEADED: "Do you feel lightheaded?" (e.g., somewhat faint, woozy, weak upon standing)     Yes 3. VERTIGO: "Do you feel like either you or the room is spinning or tilting?" (i.e. vertigo)      No 4. SEVERITY: "How bad is it?"  "Do you feel like you are going to faint?" "Can you stand and walk?"   - MILD - walking normally   - MODERATE - interferes with normal activities (e.g., work, school)    - SEVERE - unable to stand, requires support to walk, feels like passing out now.      Moderate 5. ONSET:  "When did the dizziness begin?"     Today 6. AGGRAVATING FACTORS: "Does anything make it worse?" (e.g., standing, change in head position)     Walking 7. HEART RATE: "Can you tell me your heart rate?" "How many beats in 15 seconds?"  (Note: not all patients can do this)       No 8. CAUSE: "What do you think is causing the dizziness?"     Unsure 9. RECURRENT SYMPTOM: "Have you had dizziness before?" If so, ask: "When was the last time?" "What happened that time?"     Yes - has had vertigo 10. OTHER SYMPTOMS: "Do you have any other symptoms?" (e.g., fever, chest pain, vomiting, diarrhea, bleeding)       No 11. PREGNANCY: "Is there any chance you are pregnant?" "When was your last menstrual period?"       No  Protocols used: DIZZINESS [facility]

## 2019-06-02 NOTE — Telephone Encounter (Signed)
FYI this patient has been schedule to see you tomorrow is there anything else we need to do or tell the patient?

## 2019-06-03 ENCOUNTER — Encounter: Payer: Self-pay | Admitting: Adult Health Nurse Practitioner

## 2019-06-03 ENCOUNTER — Ambulatory Visit (INDEPENDENT_AMBULATORY_CARE_PROVIDER_SITE_OTHER): Payer: BC Managed Care – PPO | Admitting: Adult Health Nurse Practitioner

## 2019-06-03 ENCOUNTER — Other Ambulatory Visit: Payer: Self-pay

## 2019-06-03 VITALS — BP 138/83 | HR 82 | Temp 97.8°F | Ht 63.0 in | Wt 185.4 lb

## 2019-06-03 DIAGNOSIS — M810 Age-related osteoporosis without current pathological fracture: Secondary | ICD-10-CM

## 2019-06-03 DIAGNOSIS — R42 Dizziness and giddiness: Secondary | ICD-10-CM

## 2019-06-03 DIAGNOSIS — E559 Vitamin D deficiency, unspecified: Secondary | ICD-10-CM | POA: Diagnosis not present

## 2019-06-03 DIAGNOSIS — Z780 Asymptomatic menopausal state: Secondary | ICD-10-CM | POA: Insufficient documentation

## 2019-06-03 DIAGNOSIS — M8580 Other specified disorders of bone density and structure, unspecified site: Secondary | ICD-10-CM | POA: Insufficient documentation

## 2019-06-03 MED ORDER — MECLIZINE HCL 25 MG PO TABS: 25.0000 mg | ORAL_TABLET | Freq: Three times a day (TID) | ORAL | 0 refills | Status: AC | PRN

## 2019-06-03 NOTE — Patient Instructions (Signed)
° ° ° °  If you have lab work done today you will be contacted with your lab results within the next 2 weeks.  If you have not heard from us then please contact us. The fastest way to get your results is to register for My Chart. ° ° °IF you received an x-ray today, you will receive an invoice from [facility]. Please contact [facility] at [phone] with questions or concerns regarding your invoice.  ° °IF you received labwork today, you will receive an invoice from [facility]. Please contact [facility] at [phone] with questions or concerns regarding your invoice.  ° °Our billing staff will not be able to assist you with questions regarding bills from these companies. ° °You will be contacted with the lab results as soon as they are available. The fastest way to get your results is to activate your My Chart account. Instructions are located on the last page of this paperwork. If you have not heard from us regarding the results in 2 weeks, please contact this office. °  ° ° ° °

## 2019-06-04 LAB — TSH: TSH: 0.592 u[IU]/mL (ref 0.450–4.500)

## 2019-06-04 LAB — VITAMIN D 25 HYDROXY (VIT D DEFICIENCY, FRACTURES): Vit D, 25-Hydroxy: 11.6 ng/mL — ABNORMAL LOW (ref 30.0–100.0)

## 2019-06-19 ENCOUNTER — Ambulatory Visit: Payer: BC Managed Care – PPO | Attending: Internal Medicine

## 2019-06-19 DIAGNOSIS — Z23 Encounter for immunization: Secondary | ICD-10-CM | POA: Insufficient documentation

## 2019-06-19 NOTE — Progress Notes (Signed)
   Covid-19 Vaccination Clinic  Name:  Michaelyn Degroote    MRN: [id number] DOB: 10-May-1955  06/19/2019  Ms. Dumouchel was observed post Covid-19 immunization for 15 minutes without incidence. She was provided with Vaccine Information Sheet and instruction to access the V-Safe system.   Ms. Ozog was instructed to call 911 with any severe reactions post vaccine: Marland Kitchen Difficulty breathing  . Swelling of your face and throat  . A fast heartbeat  . A bad rash all over your body  . Dizziness and weakness    Immunizations Administered    Name Date Dose VIS Date Route   Pfizer COVID-19 Vaccine 06/19/2019  1:29 PM 0.3 mL 04/02/2019 Intramuscular   Manufacturer: [place]   Lot: [id number]   [place]: [id number]

## 2019-07-09 DIAGNOSIS — R42 Dizziness and giddiness: Secondary | ICD-10-CM | POA: Insufficient documentation

## 2019-07-09 DIAGNOSIS — E559 Vitamin D deficiency, unspecified: Secondary | ICD-10-CM | POA: Insufficient documentation

## 2019-07-09 MED ORDER — VITAMIN D (ERGOCALCIFEROL) 1.25 MG (50000 UNIT) PO CAPS: 50000.0000 [IU] | ORAL_CAPSULE | ORAL | 3 refills | Status: AC

## 2019-07-09 NOTE — Progress Notes (Signed)
Telemedicine Encounter- SOAP NOTE Established Patient  This telephone encounter was conducted with the patient's (or proxy's) verbal consent via audio telecommunications: yes/no: Yes Patient was instructed to have this encounter in a suitably private space; and to only have persons present to whom they give permission to participate. In addition, patient identity was confirmed by use of name plus two identifiers (DOB and address).  I discussed the limitations, risks, security and privacy concerns of performing an evaluation and management service by telephone and the availability of in person appointments. I also discussed with the patient that there may be a patient responsible charge related to this service. The patient expressed understanding and agreed to proceed.  I spent a total of TIME; 0 MIN TO 60 MIN: 10 minutes talking with the patient or their proxy.  Chief Complaint  Patient presents with  . Dizziness    Pt stated that she has been feeling a little dizzy which started yester. She thinks that it may have came from not eating but not sure.    Subjective   Elizabeth Best is a 64 y.o. established patient. Telephone visit today for  HPI  Patient is feeling dizzy.  Feels like room is spinning.  Difficult to stand straight.  She did not eat much yesterday and is not sure if that is where her dizziness came from.  No nausea, no vomiting.    She was walking at work and had to lean against the wall until past.  Feels it is different from vertigo she had in the past.    Patient Active Problem List   Diagnosis Date Noted  . Osteoporosis without current pathological fracture 06/03/2019  . PLMD (periodic limb movement disorder) 03/29/2018  . Dyslipidemia 03/29/2018  . Low self-esteem 03/29/2018  . Disorder of pigmentation 03/29/2018  . Seasonal allergic rhinitis due to pollen 10/31/2017  . Hot flashes 10/31/2017  . Pure hypercholesterolemia 10/31/2017  . Chronic bilateral low  back pain without sciatica 10/31/2017  . Essential hypertension 10/31/2017  . History of substance abuse ([place]) 07/17/2014  . Obesity (BMI 30.0-34.9) 07/17/2014  . Depression 07/17/2014  . Insomnia 07/17/2014    Past Medical History:  Diagnosis Date  . COPD (chronic obstructive pulmonary disease) ([place])   . Depression   . Hypertension   . Osteopenia   . Substance abuse ([place])    History of crack cocaine abuse.    Current Outpatient Medications  Medication Sig Dispense Refill  . albuterol (VENTOLIN HFA) 108 (90 Base) MCG/ACT inhaler Inhale 2 puffs into the lungs every 6 (six) hours as needed for wheezing or shortness of breath. 18 g 6  . atorvastatin (LIPITOR) 20 MG tablet Take 1 tablet (20 mg total) by mouth daily. 90 tablet 3  . colesevelam (WELCHOL) 625 MG tablet Take 1 tablet (625 mg total) by mouth 2 (two) times daily with a meal. 60 tablet 3  . gabapentin (NEURONTIN) 100 MG capsule Take 1-3 capsules (100-300 mg total) by mouth at bedtime. 90 capsule 1  . hydrochlorothiazide (HYDRODIURIL) 25 MG tablet Take 1 tablet (25 mg total) by mouth daily. 90 tablet 1  . venlafaxine (EFFEXOR) 100 MG tablet Take 1 tablet (100 mg total) by mouth 2 (two) times daily with a meal. 60 tablet 6   No current facility-administered medications for this visit.    No Known Allergies  Social History   Socioeconomic History  . Marital status: Single    Spouse name: Not on file  . Number of  children: 1  . Years of education: BA  . Highest education level: Not on file  Occupational History  . Occupation: custodian    Fish farm manager: Programmer, applications  Tobacco Use  . Smoking status: Current Every Day Smoker    Packs/day: 0.50    Types: Cigarettes  . Smokeless tobacco: Never Used  Substance and Sexual Activity  . Alcohol use: No  . Drug use: No    Comment: history of crack cocaine abuse  . Sexual activity: Not Currently    Partners: Female    Comment: Same sex partners  Other Topics Concern   . Not on file  Social History Narrative   Marital status:  Single; not dating in years; same sexual partners      Children:  1 son (45); 6 grandchildren.  Son in [place]; grandchildren in [place].      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      Tobacco:  1/2 ppd x 30 years; never quit.  Quit once for one month with patch.       Alcohol:  None      Drugs: none currently; history of crack cocaine abuse.      Exercise: rides bike, treadmill.  Going to gym once weekly in 2018.   Drinks 2 caffeine drinks a day    Social Determinants of Radio broadcast assistant Strain:   . Difficulty of Paying Living Expenses:   Food Insecurity:   . Worried About Charity fundraiser in the Last Year:   . Arboriculturist in the Last Year:   Transportation Needs:   . Film/video editor (Medical):   Marland Kitchen Lack of Transportation (Non-Medical):   Physical Activity:   . Days of Exercise per Week:   . Minutes of Exercise per Session:   Stress:   . Feeling of Stress :   Social Connections:   . Frequency of Communication with Friends and Family:   . Frequency of Social Gatherings with Friends and Family:   . Attends Religious Services:   . Active Member of Clubs or Organizations:   . Attends Archivist Meetings:   Marland Kitchen Marital Status:   Intimate Partner Violence:   . Fear of Current or Ex-Partner:   . Emotionally Abused:   Marland Kitchen Physically Abused:   . Sexually Abused:     ROS   Review of Systems See HPI Constitution: No fevers or chills No malaise No diaphoresis Skin: No rash or itching Eyes: no blurry vision, no double vision GU: no dysuria or hematuria Neuro: no dizziness or headaches   Objective   Vitals as reported by the patient: Today's Vitals   06/03/19 1455 06/03/19 1456 06/03/19 1457  BP: 128/77 136/86 138/83  Pulse: 82    Temp: 97.8 F (36.6 C)    TempSrc: Temporal      SpO2: 98%    Weight: 185 lb 6.4 oz (84.1 kg)    Height: 5\' 3"  (1.6 m)      Elizabeth Best was seen today for dizziness.  Diagnoses and all orders for this visit:  Osteoporosis without current pathological fracture, unspecified osteoporosis type -     VITAMIN D 25 Hydroxy (Vit-D Deficiency, Fractures)  Vertigo -     VITAMIN D 25 Hydroxy (Vit-D Deficiency, Fractures) -     TSH  Other orders -  meclizine (ANTIVERT) 25 MG tablet; Take 1 tablet (25 mg total) by mouth 3 (three) times daily as needed for dizziness.     I discussed the assessment and treatment plan with the patient. The patient was provided an opportunity to ask questions and all were answered. The patient agreed with the plan and demonstrated an understanding of the instructions.   The patient was advised to call back or seek an in-person evaluation if the symptoms worsen or if the condition fails to improve as anticipated.  I provided 10 minutes of non-face-to-face time during this encounter.  Glyn Ade, NP  Primary Care at [facility]

## 2019-07-10 ENCOUNTER — Ambulatory Visit: Payer: BC Managed Care – PPO | Attending: Internal Medicine

## 2019-07-10 DIAGNOSIS — Z23 Encounter for immunization: Secondary | ICD-10-CM

## 2019-07-10 NOTE — Progress Notes (Signed)
   Covid-19 Vaccination Clinic  Name:  Elizabeth Best    MRN: [id number] DOB: 05/27/1955  07/10/2019  [name] was observed post Covid-19 immunization for 15 minutes without incident. She was provided with Vaccine Information Sheet and instruction to access the V-Safe system.   Ms. Trace was instructed to call 911 with any severe reactions post vaccine: Marland Kitchen Difficulty breathing  . Swelling of face and throat  . A fast heartbeat  . A bad rash all over body  . Dizziness and weakness   Immunizations Administered    Name Date Dose VIS Date Route   Pfizer COVID-19 Vaccine 07/10/2019 10:42 AM 0.3 mL 04/02/2019 Intramuscular   Manufacturer: [place]   Lot: [id number]   [place]: [id number]

## 2019-07-14 ENCOUNTER — Ambulatory Visit: Payer: Self-pay

## 2019-08-24 ENCOUNTER — Ambulatory Visit: Payer: BC Managed Care – PPO | Admitting: Family Medicine

## 2019-08-26 ENCOUNTER — Telehealth: Payer: Self-pay

## 2019-08-26 NOTE — Telephone Encounter (Signed)
-----   Message from Wendall Mola, NP sent at 07/09/2019  4:02 AM EDT ----- Please let patient she has low Vitamin D.  I sent in a prescription to the pharmacy for her to take 1x a week x 3 months.

## 2019-08-26 NOTE — Telephone Encounter (Signed)
Spoke with pt to let her know about her Lab result and to make sure that she picked up her Vit D from the pharmacy. She did stated that she was going through a financial hard time so I told her I wasn't sure what the medication cost but if she could contact her pharmacy they would be able to let her know the pricing.

## 2019-09-06 ENCOUNTER — Ambulatory Visit: Payer: BC Managed Care – PPO | Admitting: Registered Nurse

## 2019-09-07 ENCOUNTER — Ambulatory Visit: Payer: BC Managed Care – PPO | Admitting: Registered Nurse

## 2019-09-10 ENCOUNTER — Encounter: Payer: Self-pay | Admitting: Registered Nurse

## 2019-09-10 ENCOUNTER — Ambulatory Visit (INDEPENDENT_AMBULATORY_CARE_PROVIDER_SITE_OTHER): Payer: BC Managed Care – PPO | Admitting: Registered Nurse

## 2019-09-10 ENCOUNTER — Other Ambulatory Visit: Payer: Self-pay

## 2019-09-10 VITALS — BP 128/80 | HR 68 | Temp 98.2°F | Ht 63.0 in | Wt 184.0 lb

## 2019-09-10 DIAGNOSIS — S46811D Strain of other muscles, fascia and tendons at shoulder and upper arm level, right arm, subsequent encounter: Secondary | ICD-10-CM

## 2019-09-10 DIAGNOSIS — Z90710 Acquired absence of both cervix and uterus: Secondary | ICD-10-CM

## 2019-09-10 MED ORDER — METHOCARBAMOL 500 MG PO TABS: 500.0000 mg | ORAL_TABLET | Freq: Four times a day (QID) | ORAL | 0 refills | Status: DC | PRN

## 2019-09-10 MED ORDER — MELOXICAM 15 MG PO TABS: 15.0000 mg | ORAL_TABLET | Freq: Every day | ORAL | 0 refills | Status: DC
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
## 2019-09-10 NOTE — Patient Instructions (Addendum)
Ms Elizabeth Best to see you today. The medications are as follows:  Meloxicam: Take 1 tablet (15mg ) daily. This is an NSAID - do not take ibuprofen, aleve, advil, or the like while taking this medication. You can supplement this with tylenol if you need.   Methocarbamol: Take 1 tablet (500mg ) up to four times daily as needed. This is a muscle relaxer. I recommend this for use after work and before bed. It should not be too much of a sedative medication, but if you experience any issues, please let me know.  I recommend daily light stretching for best prevention of further injuries. Additionally, applying heat/ice to affected areas may provide relief.  Thank you,  Kathrin Ruddy, NP    If you have lab work done today you will be contacted with your lab results within the next 2 weeks.  If you have not heard from [place] then please contact us. The fastest way to get your results is to register for My Chart.   IF you received an x-ray today, you will receive an invoice from [facility]. Please contact [facility] at [phone] with questions or concerns regarding your invoice.   IF you received labwork today, you will receive an invoice from [facility]. Please contact [facility] at [phone] with questions or concerns regarding your invoice.   Our billing staff will not be able to assist you with questions regarding bills from these companies.  You will be contacted with the lab results as soon as they are available. The fastest way to get your results is to activate your My Chart account. Instructions are located on the last page of this paperwork. If you have not heard from [place] regarding the results in 2 weeks, please contact this office.

## 2019-10-08 ENCOUNTER — Other Ambulatory Visit: Payer: Self-pay

## 2019-10-08 DIAGNOSIS — E785 Hyperlipidemia, unspecified: Secondary | ICD-10-CM

## 2019-10-08 MED ORDER — COLESEVELAM HCL 625 MG PO TABS: 625.0000 mg | ORAL_TABLET | Freq: Two times a day (BID) | ORAL | 2 refills | Status: DC

## 2019-12-15 ENCOUNTER — Other Ambulatory Visit: Payer: Self-pay | Admitting: Registered Nurse

## 2019-12-15 ENCOUNTER — Encounter: Payer: Self-pay | Admitting: Registered Nurse

## 2019-12-15 ENCOUNTER — Ambulatory Visit: Payer: BC Managed Care – PPO | Admitting: Registered Nurse

## 2019-12-15 ENCOUNTER — Other Ambulatory Visit: Payer: Self-pay

## 2019-12-15 VITALS — BP 144/85 | HR 74 | Temp 97.6°F | Resp 18 | Wt 187.6 lb

## 2019-12-15 DIAGNOSIS — I10 Essential (primary) hypertension: Secondary | ICD-10-CM | POA: Diagnosis not present

## 2019-12-15 DIAGNOSIS — E669 Obesity, unspecified: Secondary | ICD-10-CM | POA: Diagnosis not present

## 2019-12-15 DIAGNOSIS — E7800 Pure hypercholesterolemia, unspecified: Secondary | ICD-10-CM | POA: Diagnosis not present

## 2019-12-15 DIAGNOSIS — M5432 Sciatica, left side: Secondary | ICD-10-CM | POA: Diagnosis not present

## 2019-12-15 DIAGNOSIS — S46811D Strain of other muscles, fascia and tendons at shoulder and upper arm level, right arm, subsequent encounter: Secondary | ICD-10-CM

## 2019-12-15 DIAGNOSIS — M5431 Sciatica, right side: Secondary | ICD-10-CM

## 2019-12-15 DIAGNOSIS — L709 Acne, unspecified: Secondary | ICD-10-CM

## 2019-12-15 DIAGNOSIS — J449 Chronic obstructive pulmonary disease, unspecified: Secondary | ICD-10-CM

## 2019-12-15 DIAGNOSIS — E785 Hyperlipidemia, unspecified: Secondary | ICD-10-CM | POA: Diagnosis not present

## 2019-12-15 LAB — POCT GLYCOSYLATED HEMOGLOBIN (HGB A1C): Hemoglobin A1C: 5.3 % (ref 4.0–5.6)

## 2019-12-15 LAB — GLUCOSE, POCT (MANUAL RESULT ENTRY): POC Glucose: 86 mg/dl (ref 70–99)

## 2019-12-15 MED ORDER — METHYLPREDNISOLONE ACETATE 40 MG/ML IJ SUSP
40.0000 mg | Freq: Once | INTRAMUSCULAR | Status: AC
  Administered 2019-12-15: 40 mg via INTRAMUSCULAR

## 2019-12-15 MED ORDER — ATORVASTATIN CALCIUM 20 MG PO TABS: 20.0000 mg | ORAL_TABLET | Freq: Every day | ORAL | 3 refills | Status: DC

## 2019-12-15 MED ORDER — METHOCARBAMOL 500 MG PO TABS: 500.0000 mg | ORAL_TABLET | Freq: Four times a day (QID) | ORAL | 0 refills | Status: DC | PRN

## 2019-12-15 MED ORDER — GABAPENTIN 100 MG PO CAPS: 100.0000 mg | ORAL_CAPSULE | Freq: Every day | ORAL | 1 refills | Status: DC

## 2019-12-15 MED ORDER — ALBUTEROL SULFATE HFA 108 (90 BASE) MCG/ACT IN AERS: 2.0000 | INHALATION_SPRAY | Freq: Four times a day (QID) | RESPIRATORY_TRACT | 6 refills | Status: DC | PRN

## 2019-12-15 MED ORDER — MELOXICAM 15 MG PO TABS: 15.0000 mg | ORAL_TABLET | Freq: Every day | ORAL | 0 refills | Status: DC

## 2019-12-15 MED ORDER — CLINDAMYCIN PHOS-BENZOYL PEROX 1-5 % EX GEL: Freq: Two times a day (BID) | CUTANEOUS | 0 refills | Status: DC

## 2019-12-15 MED ORDER — COLESEVELAM HCL 625 MG PO TABS: 625.0000 mg | ORAL_TABLET | Freq: Two times a day (BID) | ORAL | 2 refills | Status: DC

## 2019-12-15 MED ORDER — HYDROCHLOROTHIAZIDE 25 MG PO TABS: 25.0000 mg | ORAL_TABLET | Freq: Every day | ORAL | 1 refills | Status: DC

## 2019-12-15 NOTE — Patient Instructions (Signed)
° ° ° °  If you have lab work done today you will be contacted with your lab results within the next 2 weeks.  If you have not heard from us then please contact us. The fastest way to get your results is to register for My Chart. ° ° °IF you received an x-ray today, you will receive an invoice from [facility]. Please contact [facility] at [phone] with questions or concerns regarding your invoice.  ° °IF you received labwork today, you will receive an invoice from [facility]. Please contact [facility] at [phone] with questions or concerns regarding your invoice.  ° °Our billing staff will not be able to assist you with questions regarding bills from these companies. ° °You will be contacted with the lab results as soon as they are available. The fastest way to get your results is to activate your My Chart account. Instructions are located on the last page of this paperwork. If you have not heard from us regarding the results in 2 weeks, please contact this office. °  ° ° ° °
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
## 2019-12-16 ENCOUNTER — Encounter: Payer: Self-pay | Admitting: Registered Nurse

## 2019-12-16 ENCOUNTER — Encounter: Payer: Self-pay | Admitting: Radiology

## 2019-12-16 LAB — LIPID PANEL
Chol/HDL Ratio: 3.8 ratio (ref 0.0–4.4)
Cholesterol, Total: 140 mg/dL (ref 100–199)
HDL: 37 mg/dL — ABNORMAL LOW (ref >39)
LDL Chol Calc (NIH): 79 mg/dL (ref 0–99)
Triglycerides: 135 mg/dL (ref 0–149)
VLDL Cholesterol Cal: 24 mg/dL (ref 5–40)

## 2019-12-16 LAB — COMPREHENSIVE METABOLIC PANEL
ALT: 24 IU/L (ref 0–32)
AST: 14 IU/L (ref 0–40)
Albumin/Globulin Ratio: 1.7 (ref 1.2–2.2)
Albumin: 4.4 g/dL (ref 3.8–4.8)
Alkaline Phosphatase: 113 IU/L (ref 48–121)
BUN/Creatinine Ratio: 21 (ref 12–28)
BUN: 16 mg/dL (ref 8–27)
Bilirubin Total: <0.2 mg/dL (ref 0.0–1.2)
CO2: 24 mmol/L (ref 20–29)
Calcium: 10.6 mg/dL — ABNORMAL HIGH (ref 8.7–10.3)
Chloride: 102 mmol/L (ref 96–106)
Creatinine, Ser: 0.78 mg/dL (ref 0.57–1.00)
GFR calc Af Amer: 95 mL/min/{1.73_m2} (ref >59)
GFR calc non Af Amer: 82 mL/min/{1.73_m2} (ref >59)
Globulin, Total: 2.6 g/dL (ref 1.5–4.5)
Glucose: 87 mg/dL (ref 65–99)
Potassium: 4.1 mmol/L (ref 3.5–5.2)
Sodium: 137 mmol/L (ref 134–144)
Total Protein: 7 g/dL (ref 6.0–8.5)

## 2019-12-16 LAB — CBC WITH DIFFERENTIAL
Basophils Absolute: 0.1 10*3/uL (ref 0.0–0.2)
Basos: 1 %
EOS (ABSOLUTE): 0.2 10*3/uL (ref 0.0–0.4)
Eos: 2 %
Hematocrit: 41.4 % (ref 34.0–46.6)
Hemoglobin: 13.5 g/dL (ref 11.1–15.9)
Immature Grans (Abs): 0 10*3/uL (ref 0.0–0.1)
Immature Granulocytes: 0 %
Lymphocytes Absolute: 2.9 10*3/uL (ref 0.7–3.1)
Lymphs: 28 %
MCH: 25.5 pg — ABNORMAL LOW (ref 26.6–33.0)
MCHC: 32.6 g/dL (ref 31.5–35.7)
MCV: 78 fL — ABNORMAL LOW (ref 79–97)
Monocytes Absolute: 0.7 10*3/uL (ref 0.1–0.9)
Monocytes: 7 %
Neutrophils Absolute: 6.5 10*3/uL (ref 1.4–7.0)
Neutrophils: 62 %
RBC: 5.29 x10E6/uL — ABNORMAL HIGH (ref 3.77–5.28)
RDW: 15.5 % — ABNORMAL HIGH (ref 11.7–15.4)
WBC: 10.4 10*3/uL (ref 3.4–10.8)

## 2019-12-16 NOTE — Progress Notes (Signed)
Good morning,  Normal results letter, please  Thank you  Kathrin Ruddy, NP

## 2020-02-07 ENCOUNTER — Encounter: Payer: Self-pay | Admitting: Registered Nurse

## 2020-02-07 ENCOUNTER — Other Ambulatory Visit: Payer: Self-pay

## 2020-02-07 ENCOUNTER — Ambulatory Visit (INDEPENDENT_AMBULATORY_CARE_PROVIDER_SITE_OTHER): Payer: No Typology Code available for payment source

## 2020-02-07 ENCOUNTER — Ambulatory Visit (INDEPENDENT_AMBULATORY_CARE_PROVIDER_SITE_OTHER): Payer: No Typology Code available for payment source | Admitting: Registered Nurse

## 2020-02-07 VITALS — BP 124/76 | HR 77 | Temp 97.2°F | Resp 16 | Ht 63.0 in | Wt 191.0 lb

## 2020-02-07 DIAGNOSIS — M5431 Sciatica, right side: Secondary | ICD-10-CM | POA: Diagnosis not present

## 2020-02-07 DIAGNOSIS — E559 Vitamin D deficiency, unspecified: Secondary | ICD-10-CM | POA: Diagnosis not present

## 2020-02-07 DIAGNOSIS — M5432 Sciatica, left side: Secondary | ICD-10-CM

## 2020-02-07 DIAGNOSIS — J449 Chronic obstructive pulmonary disease, unspecified: Secondary | ICD-10-CM | POA: Diagnosis not present

## 2020-02-07 MED ORDER — ALBUTEROL SULFATE HFA 108 (90 BASE) MCG/ACT IN AERS: 2.0000 | INHALATION_SPRAY | Freq: Four times a day (QID) | RESPIRATORY_TRACT | 6 refills | Status: DC | PRN

## 2020-02-07 MED ORDER — GABAPENTIN 100 MG PO CAPS: 100.0000 mg | ORAL_CAPSULE | Freq: Every day | ORAL | 1 refills | Status: DC

## 2020-02-07 MED ORDER — VITAMIN D (ERGOCALCIFEROL) 1.25 MG (50000 UNIT) PO CAPS: 50000.0000 [IU] | ORAL_CAPSULE | ORAL | 1 refills | Status: DC

## 2020-02-07 NOTE — Patient Instructions (Addendum)
Over the counter meds:  Hydrocoritsone: apply rectally once to twice daily for no more than a two week span. This will help with hemorrhoids.  MiraLax: take 1 serving each day as needed. Adjust timing of this to your convenience. Drink plenty of fluids on days when you take this medication  I will refer you to ortho and to dermatology.    If you have lab work done today you will be contacted with your lab results within the next 2 weeks.  If you have not heard from [place] then please contact us. The fastest way to get your results is to register for My Chart.   IF you received an x-ray today, you will receive an invoice from [facility]. Please contact [facility] at [phone] with questions or concerns regarding your invoice.   IF you received labwork today, you will receive an invoice from [facility]. Please contact [facility] at [phone] with questions or concerns regarding your invoice.   Our billing staff will not be able to assist you with questions regarding bills from these companies.  You will be contacted with the lab results as soon as they are available. The fastest way to get your results is to activate your My Chart account. Instructions are located on the last page of this paperwork. If you have not heard from [place] regarding the results in 2 weeks, please contact this office.

## 2020-02-09 ENCOUNTER — Other Ambulatory Visit: Payer: Self-pay | Admitting: Family Medicine

## 2020-02-09 ENCOUNTER — Telehealth: Payer: Self-pay | Admitting: Registered Nurse

## 2020-02-09 DIAGNOSIS — Z1231 Encounter for screening mammogram for malignant neoplasm of breast: Secondary | ICD-10-CM

## 2020-02-09 NOTE — Telephone Encounter (Signed)
Pt was seen 02/07/2020 and  thought that they had discussed something to be prescribed for pain    Unable to see an order    Please advise

## 2020-02-09 NOTE — Telephone Encounter (Signed)
Pt was requesting medication for her Back pain, possible opioid?   Also she is requesting the Ortho appointment to be placed stat. I have pended it for you to send.

## 2020-02-09 NOTE — Telephone Encounter (Signed)
Patient was seen this week and thought a referral was discussed for Ortho and dermatologist   Unable to see either    Please advise

## 2020-02-10 NOTE — Telephone Encounter (Signed)
Being handled in new message

## 2020-02-10 NOTE — Addendum Note (Signed)
Addended by: Patrcia Dolly on: 02/10/2020 01:33 PM   Modules accepted: Orders

## 2020-02-10 NOTE — Telephone Encounter (Signed)
Spoke with pt and she states she did not need a GYN referral I closed this as it doesn't seem to be for this pt as she states she has not had any vaginal discomfort and does not want any pap.   Also let her know we are working on her other two referrals as well as an Rx that she states was to be sent in

## 2020-02-10 NOTE — Telephone Encounter (Signed)
Pt is calling about the referrals that she talked to provider about she keeps getting calls for a OBGYN and not dermatology and is needing that fixed. Please advise.

## 2020-02-10 NOTE — Telephone Encounter (Signed)
Pt is calling again about medication for back pain. Let pt know that provider is out of the office today. Pt would like a call regarding this. Pt is in pain and getting frustrated. Please advise.

## 2020-02-10 NOTE — Telephone Encounter (Signed)
Pt asked for a referral to an orthopedic and a dermatologist / but received OBGYN referral / Pt also states she hasnt received RX for pain/ please advise

## 2020-02-11 ENCOUNTER — Encounter: Payer: BC Managed Care – PPO | Admitting: Obstetrics and Gynecology

## 2020-02-18 ENCOUNTER — Other Ambulatory Visit: Payer: Self-pay | Admitting: Registered Nurse

## 2020-02-18 DIAGNOSIS — M5432 Sciatica, left side: Secondary | ICD-10-CM

## 2020-02-18 DIAGNOSIS — M5431 Sciatica, right side: Secondary | ICD-10-CM

## 2020-02-18 MED ORDER — TRAMADOL HCL 50 MG PO TABS: 50.0000 mg | ORAL_TABLET | Freq: Three times a day (TID) | ORAL | 0 refills | Status: DC | PRN

## 2020-02-21 ENCOUNTER — Encounter: Payer: Self-pay | Admitting: Registered Nurse
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
## 2020-02-25 ENCOUNTER — Other Ambulatory Visit: Payer: Self-pay

## 2020-02-25 ENCOUNTER — Ambulatory Visit (INDEPENDENT_AMBULATORY_CARE_PROVIDER_SITE_OTHER): Payer: No Typology Code available for payment source | Admitting: Family Medicine

## 2020-02-25 ENCOUNTER — Encounter: Payer: Self-pay | Admitting: Family Medicine

## 2020-02-25 DIAGNOSIS — M4316 Spondylolisthesis, lumbar region: Secondary | ICD-10-CM | POA: Diagnosis not present

## 2020-02-25 DIAGNOSIS — M545 Low back pain, unspecified: Secondary | ICD-10-CM
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
## 2020-03-10 ENCOUNTER — Ambulatory Visit
Admission: RE | Admit: 2020-03-10 | Discharge: 2020-03-10 | Disposition: A | Discharge status: Home / Self Care | Payer: No Typology Code available for payment source | Source: Ambulatory Visit | Attending: Family Medicine | Admitting: Family Medicine

## 2020-03-10 ENCOUNTER — Other Ambulatory Visit: Payer: Self-pay

## 2020-03-10 DIAGNOSIS — Z1231 Encounter for screening mammogram for malignant neoplasm of breast: Secondary | ICD-10-CM

## 2020-03-24 MED FILL — COLESEVELAM HCL 625 MG TABS: 625 | 30 days supply | Qty: 60 | Fill #0

## 2020-03-24 MED FILL — ATORVASTATIN CALCIUM 20 MG: 20 | 90 days supply | Qty: 90 | Fill #0

## 2020-03-30 ENCOUNTER — Ambulatory Visit: Payer: No Typology Code available for payment source

## 2020-03-30 ENCOUNTER — Other Ambulatory Visit: Payer: Self-pay

## 2020-03-30 NOTE — Therapy (Signed)
[place], [place], [address] Phone: [phone]   Fax:  [phone]  Patient Details  Name: Elizabeth Best MRN: [id number] Date of Birth: January 20, 1956 Referring Provider:  Eunice Blase, MD  Encounter Date: 03/30/2020    Patient arrived for initial evaluation but stated she is hesitant to move forward with physical therapy at this time due to financial concerns. PT presented the option of appointments every other week with more emphasis on a home exercise program that pt would need to be more consistent with on days outside of PT. Pt reports that her pain has been significantly lately and she is hopeful that potential injection from doctor will provide more immediate relief for now since relief from PT may take longer and be a financial burden. PT informed pt that she can call to schedule another evaluation if she wishes to move forward with skilled PT in the future.   Haydee Monica, PT, DPT 03/30/20 4:38 PM   [facility] [address] [place], [place], [address] Phone: [phone]   Fax:  [phone]

## 2020-06-25 ENCOUNTER — Other Ambulatory Visit: Payer: Self-pay | Admitting: Registered Nurse

## 2020-06-25 DIAGNOSIS — I10 Essential (primary) hypertension: Secondary | ICD-10-CM

## 2020-06-25 NOTE — Telephone Encounter (Signed)
Requested Prescriptions  Pending Prescriptions Disp Refills  . hydrochlorothiazide (HYDRODIURIL) 25 MG tablet [Pharmacy Med Name: hydroCHLOROthiazide 25 MG Oral Tablet] 90 tablet 0    Sig: Take 1 tablet by mouth once daily     Cardiovascular: Diuretics - Thiazide Failed - 06/25/2020 12:15 PM      Failed - Ca in normal range and within 360 days    Calcium  Date Value Ref Range Status  12/15/2019 10.6 (H) 8.7 - 10.3 mg/dL Final   Calcium, Ion  Date Value Ref Range Status  09/01/2012 1.24 (H) 1.12 - 1.23 mmol/L Final         Passed - Cr in normal range and within 360 days    Creat  Date Value Ref Range Status  12/27/2015 0.74 0.50 - 1.05 mg/dL Final    Comment:      For patients > or = 63 years of age: The upper reference limit for Creatinine is approximately 13% higher for people identified as African-American.      Creatinine, Ser  Date Value Ref Range Status  12/15/2019 0.78 0.57 - 1.00 mg/dL Final         Passed - K in normal range and within 360 days    Potassium  Date Value Ref Range Status  12/15/2019 4.1 3.5 - 5.2 mmol/L Final         Passed - Na in normal range and within 360 days    Sodium  Date Value Ref Range Status  12/15/2019 137 134 - 144 mmol/L Final         Passed - Last BP in normal range    BP Readings from Last 1 Encounters:  02/07/20 124/76         Passed - Valid encounter within last 6 months    Recent Outpatient Visits          4 months ago Vitamin D deficiency   [facility], Delfino Lovett, NP   6 months ago Bilateral sciatica   [facility], Delfino Lovett, NP   9 months ago Strain of right trapezius muscle, subsequent encounter   [facility], Delfino Lovett, NP   1 year ago Vertigo   [facility], NP   1 year ago Essential hypertension   [facility], Arlie Solomons, MD

## 2020-06-30 MED FILL — HYDROCHLOROTHIAZIDE 25 MG T: 25 | 90 days supply | Qty: 90 | Fill #0

## 2020-07-14 ENCOUNTER — Ambulatory Visit (INDEPENDENT_AMBULATORY_CARE_PROVIDER_SITE_OTHER): Payer: No Typology Code available for payment source | Admitting: Registered Nurse

## 2020-07-14 ENCOUNTER — Other Ambulatory Visit: Payer: Self-pay | Admitting: Registered Nurse

## 2020-07-14 ENCOUNTER — Other Ambulatory Visit (HOSPITAL_BASED_OUTPATIENT_CLINIC_OR_DEPARTMENT_OTHER): Payer: Self-pay

## 2020-07-14 ENCOUNTER — Other Ambulatory Visit: Payer: Self-pay

## 2020-07-14 VITALS — BP 133/78 | HR 82 | Temp 97.6°F | Resp 15 | Ht 63.0 in | Wt 193.4 lb

## 2020-07-14 DIAGNOSIS — E7800 Pure hypercholesterolemia, unspecified: Secondary | ICD-10-CM | POA: Diagnosis not present

## 2020-07-14 DIAGNOSIS — M5432 Sciatica, left side: Secondary | ICD-10-CM

## 2020-07-14 DIAGNOSIS — R5382 Chronic fatigue, unspecified: Secondary | ICD-10-CM

## 2020-07-14 DIAGNOSIS — I10 Essential (primary) hypertension: Secondary | ICD-10-CM

## 2020-07-14 DIAGNOSIS — M5431 Sciatica, right side: Secondary | ICD-10-CM

## 2020-07-14 DIAGNOSIS — J449 Chronic obstructive pulmonary disease, unspecified: Secondary | ICD-10-CM | POA: Diagnosis not present

## 2020-07-14 DIAGNOSIS — L709 Acne, unspecified: Secondary | ICD-10-CM

## 2020-07-14 DIAGNOSIS — R221 Localized swelling, mass and lump, neck: Secondary | ICD-10-CM

## 2020-07-14 DIAGNOSIS — E785 Hyperlipidemia, unspecified: Secondary | ICD-10-CM

## 2020-07-14 DIAGNOSIS — F32A Depression, unspecified: Secondary | ICD-10-CM

## 2020-07-14 DIAGNOSIS — F419 Anxiety disorder, unspecified: Secondary | ICD-10-CM

## 2020-07-14 DIAGNOSIS — S46811D Strain of other muscles, fascia and tendons at shoulder and upper arm level, right arm, subsequent encounter: Secondary | ICD-10-CM

## 2020-07-14 DIAGNOSIS — E559 Vitamin D deficiency, unspecified: Secondary | ICD-10-CM

## 2020-07-14 MED ORDER — VENLAFAXINE HCL ER 37.5 MG PO CP24
37.5000 mg | ORAL_CAPSULE | Freq: Every day | ORAL | 0 refills | Status: DC
  Filled 2020-07-14: qty 90, 90d supply, fill #0

## 2020-07-14 MED ORDER — CLINDAMYCIN PHOS-BENZOYL PEROX 1-5 % EX GEL
Freq: Two times a day (BID) | CUTANEOUS | 0 refills | Status: DC
  Filled 2020-07-14: qty 25, 30d supply, fill #0

## 2020-07-14 MED ORDER — ALBUTEROL SULFATE HFA 108 (90 BASE) MCG/ACT IN AERS
2.0000 | INHALATION_SPRAY | Freq: Four times a day (QID) | RESPIRATORY_TRACT | 6 refills | Status: DC | PRN
  Filled 2020-07-14: qty 18, 25d supply, fill #0

## 2020-07-14 MED ORDER — COLESEVELAM HCL 625 MG PO TABS
625.0000 mg | ORAL_TABLET | Freq: Two times a day (BID) | ORAL | 2 refills | Status: DC
  Filled 2020-07-14: qty 60, 30d supply, fill #0

## 2020-07-14 MED ORDER — VITAMIN D (ERGOCALCIFEROL) 1.25 MG (50000 UNIT) PO CAPS
50000.0000 [IU] | ORAL_CAPSULE | ORAL | 1 refills | Status: DC
Start: 2020-07-14 — End: 2020-07-14
  Filled 2020-07-14: qty 8, 56d supply, fill #0

## 2020-07-14 MED ORDER — TRAMADOL HCL 50 MG PO TABS
50.0000 mg | ORAL_TABLET | Freq: Four times a day (QID) | ORAL | 0 refills | Status: DC | PRN
  Filled 2020-07-14 – 2020-07-17 (×2): qty 30, 8d supply, fill #0

## 2020-07-14 MED ORDER — HYDROCHLOROTHIAZIDE 25 MG PO TABS
25.0000 mg | ORAL_TABLET | Freq: Every day | ORAL | 1 refills | Status: DC
  Filled 2020-07-14: qty 90, 90d supply, fill #0

## 2020-07-14 MED ORDER — GABAPENTIN 100 MG PO CAPS
100.0000 mg | ORAL_CAPSULE | Freq: Every day | ORAL | 1 refills | Status: DC
  Filled 2020-07-14: qty 90, 30d supply, fill #0

## 2020-07-14 MED ORDER — ATORVASTATIN CALCIUM 20 MG PO TABS
20.0000 mg | ORAL_TABLET | Freq: Every day | ORAL | 3 refills | Status: DC
  Filled 2020-07-14: qty 90, 90d supply, fill #0

## 2020-07-14 MED ORDER — MELOXICAM 15 MG PO TABS
15.0000 mg | ORAL_TABLET | Freq: Every day | ORAL | 0 refills | Status: DC
  Filled 2020-07-14: qty 30, 30d supply, fill #0

## 2020-07-14 NOTE — Patient Instructions (Signed)
° ° ° °  If you have lab work done today you will be contacted with your lab results within the next 2 weeks.  If you have not heard from us then please contact us. The fastest way to get your results is to register for My Chart. ° ° °IF you received an x-ray today, you will receive an invoice from [facility]. Please contact [facility] at [phone] with questions or concerns regarding your invoice.  ° °IF you received labwork today, you will receive an invoice from [facility]. Please contact [facility] at [phone] with questions or concerns regarding your invoice.  ° °Our billing staff will not be able to assist you with questions regarding bills from these companies. ° °You will be contacted with the lab results as soon as they are available. The fastest way to get your results is to activate your My Chart account. Instructions are located on the last page of this paperwork. If you have not heard from us regarding the results in 2 weeks, please contact this office. °  ° ° ° °

## 2020-07-17 ENCOUNTER — Telehealth: Payer: Self-pay | Admitting: Registered Nurse

## 2020-07-17 ENCOUNTER — Other Ambulatory Visit (HOSPITAL_BASED_OUTPATIENT_CLINIC_OR_DEPARTMENT_OTHER): Payer: Self-pay

## 2020-07-17 ENCOUNTER — Encounter: Payer: Self-pay | Admitting: Registered Nurse

## 2020-07-17 MED FILL — ALBUTEROL SULFATE HFA 108 (: 108 (90 BAS | 25 days supply | Qty: 18 | Fill #0

## 2020-07-17 MED FILL — VIT D2 1.25 MG (50,000 UNIT: 1.25 MG | 56 days supply | Qty: 8 | Fill #0

## 2020-07-17 MED FILL — MELOXICAM 15 MG TABLET: 15 | 30 days supply | Qty: 30 | Fill #0

## 2020-07-17 MED FILL — ATORVASTATIN CALCIUM 20 MG: 20 | 90 days supply | Qty: 90 | Fill #0

## 2020-07-17 MED FILL — CLINDAMYCIN PHOS-BENZOYL PE: 1-5 | 30 days supply | Qty: 25 | Fill #0

## 2020-07-17 MED FILL — COLESEVELAM HCL 625 MG TABS: 625 | 30 days supply | Qty: 60 | Fill #0

## 2020-07-17 MED FILL — VENLAFAXINE HCL ER 37.5 MG: 37.5 | 90 days supply | Qty: 90 | Fill #0

## 2020-07-17 NOTE — Telephone Encounter (Signed)
Patient said that an ultrasound of knot on collar bone/shoulder area was supposed to be scheduled.  Please advise
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
## 2020-07-17 NOTE — Telephone Encounter (Signed)
Pt called in stating that the medications were sent to the wrong pharmacy she needs her meds to be sent to [facility] on [address]   She also needs her lab orders sent to [facility]   She states that an order was to be placed for an US of the shoulder.  Please advise

## 2020-07-17 NOTE — Telephone Encounter (Signed)
Tramadol rx needs to be sent in to [facility] - it will not transfer from other pharmacy so they will need a new rx sent.

## 2020-07-17 NOTE — Telephone Encounter (Signed)
Pt needs meds sent to [facility] [address] location no the [facility] Please advise

## 2020-07-18 NOTE — Telephone Encounter (Signed)
Order updated and will be scheduled now

## 2020-07-19 ENCOUNTER — Telehealth: Payer: Self-pay

## 2020-07-19 ENCOUNTER — Other Ambulatory Visit: Payer: Self-pay

## 2020-07-19 ENCOUNTER — Other Ambulatory Visit (HOSPITAL_BASED_OUTPATIENT_CLINIC_OR_DEPARTMENT_OTHER): Payer: Self-pay

## 2020-07-19 DIAGNOSIS — M5432 Sciatica, left side: Secondary | ICD-10-CM

## 2020-07-19 DIAGNOSIS — S46811D Strain of other muscles, fascia and tendons at shoulder and upper arm level, right arm, subsequent encounter: Secondary | ICD-10-CM

## 2020-07-19 DIAGNOSIS — M5431 Sciatica, right side: Secondary | ICD-10-CM

## 2020-07-19 LAB — COMPREHENSIVE METABOLIC PANEL
ALT: 24 IU/L (ref 0–32)
AST: 19 IU/L (ref 0–40)
Albumin/Globulin Ratio: 1.5 (ref 1.2–2.2)
Albumin: 4.4 g/dL (ref 3.8–4.8)
Alkaline Phosphatase: 121 IU/L (ref 44–121)
BUN/Creatinine Ratio: 15 (ref 12–28)
BUN: 12 mg/dL (ref 8–27)
Bilirubin Total: <0.2 mg/dL (ref 0.0–1.2)
CO2: 24 mmol/L (ref 20–29)
Calcium: 10.9 mg/dL — ABNORMAL HIGH (ref 8.7–10.3)
Chloride: 102 mmol/L (ref 96–106)
Creatinine, Ser: 0.82 mg/dL (ref 0.57–1.00)
Globulin, Total: 2.9 g/dL (ref 1.5–4.5)
Glucose: 84 mg/dL (ref 65–99)
Potassium: 4.5 mmol/L (ref 3.5–5.2)
Sodium: 140 mmol/L (ref 134–144)
Total Protein: 7.3 g/dL (ref 6.0–8.5)
eGFR: 81 mL/min/{1.73_m2} (ref >59)

## 2020-07-19 LAB — CBC WITH DIFFERENTIAL
Basophils Absolute: 0.1 10*3/uL (ref 0.0–0.2)
Basos: 1 %
EOS (ABSOLUTE): 0.1 10*3/uL (ref 0.0–0.4)
Eos: 1 %
Hematocrit: 30.3 % — ABNORMAL LOW (ref 34.0–46.6)
Hemoglobin: 9 g/dL — ABNORMAL LOW (ref 11.1–15.9)
Immature Grans (Abs): 0 10*3/uL (ref 0.0–0.1)
Immature Granulocytes: 0 %
Lymphocytes Absolute: 2.6 10*3/uL (ref 0.7–3.1)
Lymphs: 25 %
MCH: 20.2 pg — ABNORMAL LOW (ref 26.6–33.0)
MCHC: 29.7 g/dL — ABNORMAL LOW (ref 31.5–35.7)
MCV: 68 fL — ABNORMAL LOW (ref 79–97)
Monocytes Absolute: 0.6 10*3/uL (ref 0.1–0.9)
Monocytes: 6 %
Neutrophils Absolute: 7 10*3/uL (ref 1.4–7.0)
Neutrophils: 67 %
RBC: 4.46 x10E6/uL (ref 3.77–5.28)
RDW: 16.2 % — ABNORMAL HIGH (ref 11.7–15.4)
WBC: 10.5 10*3/uL (ref 3.4–10.8)

## 2020-07-19 LAB — VITAMIN D 25 HYDROXY (VIT D DEFICIENCY, FRACTURES): Vit D, 25-Hydroxy: 23.4 ng/mL — ABNORMAL LOW (ref 30.0–100.0)

## 2020-07-19 LAB — HEMOGLOBIN A1C
Est. average glucose Bld gHb Est-mCnc: 103 mg/dL
Hgb A1c MFr Bld: 5.2 % (ref 4.8–5.6)

## 2020-07-19 LAB — LIPID PANEL
Chol/HDL Ratio: 4.9 ratio — ABNORMAL HIGH (ref 0.0–4.4)
Cholesterol, Total: 172 mg/dL (ref 100–199)
HDL: 35 mg/dL — ABNORMAL LOW (ref >39)
LDL Chol Calc (NIH): 107 mg/dL — ABNORMAL HIGH (ref 0–99)
Triglycerides: 168 mg/dL — ABNORMAL HIGH (ref 0–149)
VLDL Cholesterol Cal: 30 mg/dL (ref 5–40)

## 2020-07-19 LAB — TSH: TSH: 0.75 u[IU]/mL (ref 0.450–4.500)

## 2020-07-19 LAB — VITAMIN B12: Vitamin B-12: 939 pg/mL (ref 232–1245)

## 2020-07-19 MED ORDER — TRAMADOL HCL 50 MG PO TABS: 50.0000 mg | ORAL_TABLET | Freq: Four times a day (QID) | ORAL | 0 refills | Status: DC | PRN

## 2020-07-19 NOTE — Telephone Encounter (Signed)
Ordered to outpatient pharmacy on [address].

## 2020-07-19 NOTE — Telephone Encounter (Signed)
Elizabeth Best pt saw her on 07/14/2020 rx tramadol but sent to [place] location rather than the requested [address] location can you please send this so she does not have to wait until next week when he returns.

## 2020-07-24 ENCOUNTER — Other Ambulatory Visit (HOSPITAL_COMMUNITY): Payer: Self-pay

## 2020-07-24 ENCOUNTER — Telehealth: Payer: Self-pay | Admitting: Registered Nurse

## 2020-07-24 ENCOUNTER — Ambulatory Visit
Admission: RE | Admit: 2020-07-24 | Discharge: 2020-07-24 | Disposition: A | Discharge status: Home / Self Care | Payer: No Typology Code available for payment source | Source: Ambulatory Visit | Attending: Registered Nurse | Admitting: Registered Nurse

## 2020-07-24 DIAGNOSIS — R221 Localized swelling, mass and lump, neck: Secondary | ICD-10-CM

## 2020-07-24 MED FILL — Tramadol HCl Tab 50 MG: ORAL | 5 days supply | Qty: 30 | Fill #0 | Status: AC

## 2020-07-24 NOTE — Telephone Encounter (Signed)
Pt called in stating that she need a new script of the Hydroxyzine to be sent into the [facility] on [address]. She states that she is out of this medication.  Pt is aware that Delfino Lovett is out of the office today.

## 2020-07-24 NOTE — Telephone Encounter (Signed)
Pt requesting refill Hydroxyzine I do not see where we previously rx this for her please advise

## 2020-07-25 ENCOUNTER — Encounter: Payer: Self-pay | Admitting: Registered Nurse

## 2020-07-27 ENCOUNTER — Other Ambulatory Visit: Payer: Self-pay

## 2020-07-27 ENCOUNTER — Other Ambulatory Visit: Payer: Self-pay | Admitting: Registered Nurse

## 2020-07-27 ENCOUNTER — Other Ambulatory Visit (HOSPITAL_COMMUNITY): Payer: Self-pay

## 2020-07-27 DIAGNOSIS — D649 Anemia, unspecified: Secondary | ICD-10-CM

## 2020-07-27 DIAGNOSIS — F419 Anxiety disorder, unspecified: Secondary | ICD-10-CM

## 2020-07-27 DIAGNOSIS — F32A Depression, unspecified: Secondary | ICD-10-CM

## 2020-07-27 MED ORDER — HYDROXYZINE HCL 10 MG PO TABS
10.0000 mg | ORAL_TABLET | Freq: Three times a day (TID) | ORAL | 3 refills | Status: DC | PRN
  Filled 2020-07-27 – 2020-07-28 (×2): qty 30, 10d supply, fill #0

## 2020-07-27 MED ORDER — HYDROXYZINE HCL 10 MG PO TABS: 10.0000 mg | ORAL_TABLET | Freq: Three times a day (TID) | ORAL | 3 refills | Status: DC | PRN

## 2020-07-28 ENCOUNTER — Other Ambulatory Visit (HOSPITAL_COMMUNITY): Payer: Self-pay

## 2020-07-28 NOTE — Addendum Note (Signed)
Addended by: Lerry Liner on: 07/28/2020 01:56 PM   Modules accepted: Orders

## 2020-07-28 NOTE — Addendum Note (Signed)
Addended by: Lerry Liner on: 07/28/2020 01:55 PM   Modules accepted: Orders

## 2020-07-31 ENCOUNTER — Other Ambulatory Visit (INDEPENDENT_AMBULATORY_CARE_PROVIDER_SITE_OTHER): Payer: No Typology Code available for payment source

## 2020-07-31 ENCOUNTER — Other Ambulatory Visit: Payer: Self-pay

## 2020-07-31 ENCOUNTER — Other Ambulatory Visit (HOSPITAL_BASED_OUTPATIENT_CLINIC_OR_DEPARTMENT_OTHER): Payer: Self-pay

## 2020-07-31 DIAGNOSIS — D649 Anemia, unspecified: Secondary | ICD-10-CM | POA: Diagnosis not present

## 2020-07-31 LAB — CBC WITH DIFFERENTIAL/PLATELET
Basophils Absolute: 0.1 10*3/uL (ref 0.0–0.1)
Basophils Relative: 1.1 % (ref 0.0–3.0)
Eosinophils Absolute: 0.1 10*3/uL (ref 0.0–0.7)
Eosinophils Relative: 2.3 % (ref 0.0–5.0)
HCT: 29.3 % — ABNORMAL LOW (ref 36.0–46.0)
Hemoglobin: 8.9 g/dL — ABNORMAL LOW (ref 12.0–15.0)
Lymphocytes Relative: 29.2 % (ref 12.0–46.0)
Lymphs Abs: 1.9 10*3/uL (ref 0.7–4.0)
MCHC: 30.4 g/dL (ref 30.0–36.0)
MCV: 64.5 fl — ABNORMAL LOW (ref 78.0–100.0)
Monocytes Absolute: 0.4 10*3/uL (ref 0.1–1.0)
Monocytes Relative: 6 % (ref 3.0–12.0)
Neutro Abs: 4 10*3/uL (ref 1.4–7.7)
Neutrophils Relative %: 61.4 % (ref 43.0–77.0)
Platelets: 424 10*3/uL — ABNORMAL HIGH (ref 150.0–400.0)
RBC: 4.55 Mil/uL (ref 3.87–5.11)
RDW: 17.7 % — ABNORMAL HIGH (ref 11.5–15.5)
WBC: 6.5 10*3/uL (ref 4.0–10.5)

## 2020-07-31 LAB — B12 AND FOLATE PANEL
Folate: 14.2 ng/mL (ref >5.9)
Vitamin B-12: 843 pg/mL (ref 211–911)

## 2020-08-01 ENCOUNTER — Telehealth: Payer: Self-pay | Admitting: Physician Assistant

## 2020-08-01 ENCOUNTER — Other Ambulatory Visit: Payer: Self-pay | Admitting: Registered Nurse

## 2020-08-01 ENCOUNTER — Telehealth: Payer: Self-pay | Admitting: Hematology

## 2020-08-01 DIAGNOSIS — E611 Iron deficiency: Secondary | ICD-10-CM

## 2020-08-01 DIAGNOSIS — R7989 Other specified abnormal findings of blood chemistry: Secondary | ICD-10-CM

## 2020-08-01 DIAGNOSIS — D649 Anemia, unspecified: Secondary | ICD-10-CM

## 2020-08-01 LAB — IRON,TIBC AND FERRITIN PANEL
%SAT: 3 % (calc) — ABNORMAL LOW (ref 16–45)
Ferritin: 4 ng/mL — ABNORMAL LOW (ref 16–288)
Iron: 13 ug/dL — ABNORMAL LOW (ref 45–160)
TIBC: 476 mcg/dL (calc) — ABNORMAL HIGH (ref 250–450)

## 2020-08-01 NOTE — Telephone Encounter (Signed)
Received an urgent new hem from [name] at [facility] for anemia and low platelets. [name] has been scheduled to see Murray Hodgkins on 4/13 at [place] date and time has been given to [facility] from the referring office to notify the pt. Aware the pt should arrive 20 minutes early.
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
## 2020-08-01 NOTE — Telephone Encounter (Signed)
Received a call back from Rodey at [facility] to reschedule Elizabeth Best's np appt to 4/22 at 9am w/Irene. Pt was unable to be seen sooner than that date per [facility].

## 2020-08-01 NOTE — Telephone Encounter (Signed)
Elizabeth Best has been rescheduled to see dr. Irene Limbo on 4/12 at 11am. She's aware to arrive 20 minutes early.

## 2020-08-01 NOTE — Progress Notes (Signed)
If we could give Ms. Jastrzebski a call - her labs confirm a fairly severe anemia with very low iron. Unfortunately, I'm not entirely sure as to the cause of this because there are frankly quite a few things that may contribute, but at this time, she needs to see a blood specialist to replace the iron and get her blood counts back where they should be. They will also want to do further labs to figure out why this is being caused.   Thanks,  Denice Paradise

## 2020-08-02 ENCOUNTER — Inpatient Hospital Stay: Payer: No Typology Code available for payment source | Attending: Physician Assistant | Admitting: Hematology

## 2020-08-02 ENCOUNTER — Other Ambulatory Visit: Payer: No Typology Code available for payment source

## 2020-08-02 ENCOUNTER — Other Ambulatory Visit: Payer: Self-pay

## 2020-08-02 ENCOUNTER — Encounter: Payer: No Typology Code available for payment source | Admitting: Hematology

## 2020-08-02 ENCOUNTER — Encounter: Payer: No Typology Code available for payment source | Admitting: Physician Assistant

## 2020-08-02 VITALS — BP 127/68 | HR 63 | Temp 97.7°F | Resp 17 | Wt 189.9 lb

## 2020-08-02 DIAGNOSIS — F17210 Nicotine dependence, cigarettes, uncomplicated: Secondary | ICD-10-CM | POA: Insufficient documentation

## 2020-08-02 DIAGNOSIS — M8580 Other specified disorders of bone density and structure, unspecified site: Secondary | ICD-10-CM | POA: Diagnosis not present

## 2020-08-02 DIAGNOSIS — D509 Iron deficiency anemia, unspecified: Secondary | ICD-10-CM

## 2020-08-02 DIAGNOSIS — D75839 Thrombocytosis, unspecified: Secondary | ICD-10-CM | POA: Diagnosis not present

## 2020-08-03 ENCOUNTER — Telehealth: Payer: Self-pay

## 2020-08-03 NOTE — Telephone Encounter (Signed)
Pt called and left message. Pt stated she was waiting for the referral to be sent to Dr Collene Mares for her GI work up- Pt stated the office would not schedule her until the referral was sent. Will inform Dr Irene Limbo.

## 2020-08-03 NOTE — Telephone Encounter (Signed)
Opened in error

## 2020-08-07 DIAGNOSIS — D509 Iron deficiency anemia, unspecified: Secondary | ICD-10-CM | POA: Insufficient documentation

## 2020-08-07 NOTE — Progress Notes (Signed)
Intravenous Iron Formulation Change  Elizabeth Best has insurance that requires a change in intravenous iron product from Injectafer to Venofer. Orders have been updated to reflect this change and scheduling message sent to adjust infusion appointments. Dr Irene Limbo notified and agrees with the plan.  Allergies: No Known Allergies  The plan for iron therapy is as follows: Venofer 200mg  IVPB x 5 has been entered however if patient would like fewer appointments can modify to 300 mg dose x 2.  Orders updated.  Elizabeth Best 08/07/2020

## 2020-08-08 ENCOUNTER — Other Ambulatory Visit: Payer: Self-pay

## 2020-08-08 ENCOUNTER — Inpatient Hospital Stay: Payer: No Typology Code available for payment source

## 2020-08-08 ENCOUNTER — Other Ambulatory Visit: Payer: Self-pay | Admitting: Hematology

## 2020-08-08 VITALS — BP 125/69 | HR 70 | Temp 98.3°F | Resp 18 | Ht 63.0 in | Wt 187.5 lb

## 2020-08-08 DIAGNOSIS — D509 Iron deficiency anemia, unspecified: Secondary | ICD-10-CM | POA: Diagnosis not present

## 2020-08-08 MED ORDER — FAMOTIDINE 20 MG PO TABS
ORAL_TABLET | ORAL | Status: AC
  Filled 2020-08-08: qty 1

## 2020-08-08 MED ORDER — LORATADINE 10 MG PO TABS
ORAL_TABLET | ORAL | Status: AC
  Filled 2020-08-08: qty 1

## 2020-08-08 MED ORDER — SODIUM CHLORIDE 0.9 % IV SOLN
200.0000 mg | Freq: Once | INTRAVENOUS | Status: AC
  Administered 2020-08-08: 200 mg via INTRAVENOUS
  Filled 2020-08-08: qty 200

## 2020-08-08 MED ORDER — ACETAMINOPHEN 325 MG PO TABS
ORAL_TABLET | ORAL | Status: AC
  Filled 2020-08-08: qty 2

## 2020-08-08 MED ORDER — LORATADINE 10 MG PO TABS
10.0000 mg | ORAL_TABLET | Freq: Once | ORAL | Status: AC
  Administered 2020-08-08: 10 mg via ORAL

## 2020-08-08 MED ORDER — SODIUM CHLORIDE 0.9 % IV SOLN
Freq: Once | INTRAVENOUS | Status: AC
  Filled 2020-08-08: qty 250

## 2020-08-08 MED ORDER — FAMOTIDINE 20 MG PO TABS
20.0000 mg | ORAL_TABLET | Freq: Once | ORAL | Status: AC
  Administered 2020-08-08: 20 mg via ORAL

## 2020-08-08 MED ORDER — ACETAMINOPHEN 325 MG PO TABS
650.0000 mg | ORAL_TABLET | Freq: Once | ORAL | Status: AC
  Administered 2020-08-08: 650 mg via ORAL

## 2020-08-08 NOTE — Patient Instructions (Signed)

## 2020-08-09 ENCOUNTER — Other Ambulatory Visit: Payer: Self-pay

## 2020-08-09 DIAGNOSIS — D509 Iron deficiency anemia, unspecified: Secondary | ICD-10-CM

## 2020-08-09 NOTE — Progress Notes (Signed)
Referral for GI consult faxed to Dr Juanita Craver.

## 2020-08-11 ENCOUNTER — Encounter: Payer: No Typology Code available for payment source | Admitting: Physician Assistant

## 2020-08-11 ENCOUNTER — Other Ambulatory Visit: Payer: No Typology Code available for payment source

## 2020-08-15 ENCOUNTER — Other Ambulatory Visit (HOSPITAL_COMMUNITY): Payer: Self-pay

## 2020-08-15 ENCOUNTER — Other Ambulatory Visit: Payer: Self-pay | Admitting: Gastroenterology

## 2020-08-15 ENCOUNTER — Other Ambulatory Visit: Payer: Self-pay

## 2020-08-15 ENCOUNTER — Encounter (HOSPITAL_COMMUNITY): Payer: Self-pay | Admitting: *Deleted

## 2020-08-15 ENCOUNTER — Inpatient Hospital Stay (HOSPITAL_COMMUNITY): Payer: No Typology Code available for payment source | Attending: Hematology

## 2020-08-15 VITALS — BP 124/77 | HR 59 | Temp 96.9°F | Resp 18

## 2020-08-15 DIAGNOSIS — D509 Iron deficiency anemia, unspecified: Secondary | ICD-10-CM | POA: Diagnosis present

## 2020-08-15 MED ORDER — FAMOTIDINE 20 MG PO TABS
ORAL_TABLET | ORAL | Status: AC
  Filled 2020-08-15: qty 1

## 2020-08-15 MED ORDER — LORATADINE 10 MG PO TABS
10.0000 mg | ORAL_TABLET | Freq: Once | ORAL | Status: AC
  Administered 2020-08-15: 10 mg via ORAL

## 2020-08-15 MED ORDER — LORATADINE 10 MG PO TABS
ORAL_TABLET | ORAL | Status: AC
  Filled 2020-08-15: qty 1

## 2020-08-15 MED ORDER — ACETAMINOPHEN 325 MG PO TABS
ORAL_TABLET | ORAL | Status: AC
  Filled 2020-08-15: qty 2

## 2020-08-15 MED ORDER — ACETAMINOPHEN 325 MG PO TABS
650.0000 mg | ORAL_TABLET | Freq: Once | ORAL | Status: AC
  Administered 2020-08-15: 650 mg via ORAL

## 2020-08-15 MED ORDER — SODIUM CHLORIDE 0.9 % IV SOLN
200.0000 mg | Freq: Once | INTRAVENOUS | Status: AC
  Administered 2020-08-15: 200 mg via INTRAVENOUS
  Filled 2020-08-15: qty 200

## 2020-08-15 MED ORDER — CLENPIQ 10-3.5-12 MG-GM -GM/160ML PO SOLN
ORAL | 0 refills | Status: DC
  Filled 2020-08-15: qty 320, 1d supply, fill #0

## 2020-08-15 MED ORDER — FAMOTIDINE 20 MG PO TABS
20.0000 mg | ORAL_TABLET | Freq: Once | ORAL | Status: AC
  Administered 2020-08-15: 20 mg via ORAL

## 2020-08-15 MED ORDER — SODIUM CHLORIDE 0.9 % IV SOLN: Freq: Once | INTRAVENOUS | Status: AC

## 2020-08-15 NOTE — Patient Instructions (Signed)
[place]  Discharge Instructions: Thank you for choosing [facility] to provide your oncology and hematology care.  If you have a lab appointment with the [facility], please come in thru the Main Entrance and check in at the main information desk.  Wear comfortable clothing and clothing appropriate for easy access to any Portacath or PICC line.   We strive to give you quality time with your provider. You may need to reschedule your appointment if you arrive late (15 or more minutes).  Arriving late affects you and other patients whose appointments are after yours.  Also, if you miss three or more appointments without notifying the office, you may be dismissed from the clinic at the provider's discretion.      For prescription refill requests, have your pharmacy contact our office and allow 72 hours for refills to be completed.    Today you received Feraheme infusion.    To help prevent nausea and vomiting after your treatment, we encourage you to take your nausea medication as directed.  BELOW ARE SYMPTOMS THAT SHOULD BE REPORTED IMMEDIATELY: . *FEVER GREATER THAN 100.4 F (38 C) OR HIGHER . *CHILLS OR SWEATING . *NAUSEA AND VOMITING THAT IS NOT CONTROLLED WITH YOUR NAUSEA MEDICATION . *UNUSUAL SHORTNESS OF BREATH . *UNUSUAL BRUISING OR BLEEDING . *URINARY PROBLEMS (pain or burning when urinating, or frequent urination) . *BOWEL PROBLEMS (unusual diarrhea, constipation, pain near the anus) . TENDERNESS IN MOUTH AND THROAT WITH OR WITHOUT PRESENCE OF ULCERS (sore throat, sores in mouth, or a toothache) . UNUSUAL RASH, SWELLING OR PAIN  . UNUSUAL VAGINAL DISCHARGE OR ITCHING   Items with * indicate a potential emergency and should be followed up as soon as possible or go to the Emergency Department if any problems should occur.  Should you have questions after your visit or need to cancel or reschedule your appointment, please contact [facility] [phone]  and follow the prompts.  Office hours are 8:00 a.m. to 4:30 p.m. Monday - Friday. Please note that voicemails left after 4:00 p.m. may not be returned until the following business day.  We are closed weekends and major holidays. You have access to a nurse at all times for urgent questions. Please call the main number to the clinic [phone] and follow the prompts.  For any non-urgent questions, you may also contact your provider using MyChart. We now offer e-Visits for anyone 53 and older to request care online for non-urgent symptoms. For details visit [email or web address].   Also download the MyChart app! Go to the app store, search "MyChart", open the app, select [place], and log in with your MyChart username and password.  Due to Covid, a mask is required upon entering the hospital/clinic. If you do not have a mask, one will be given to you upon arrival. For doctor visits, patients may have 1 support person aged 72 or older with them. For treatment visits, patients cannot have anyone with them due to current Covid guidelines and our immunocompromised population.

## 2020-08-15 NOTE — Progress Notes (Signed)
Patient presents today for Venofer infusion. Patient receives care at [facility]. Vital signs stable. Patient denies pain today. Patient has no complaints of any side effects related to her iron infusion. Patient states, " I feel like I have more energy now than before. "   Venofer given today per MD orders. Tolerated infusion without adverse affects. Vital signs stable. No complaints at this time. Discharged from clinic ambulatory in stable condition. Alert and oriented x 3. F/U with [facility] as scheduled.

## 2020-08-16 ENCOUNTER — Other Ambulatory Visit (HOSPITAL_COMMUNITY): Payer: Self-pay

## 2020-08-16 ENCOUNTER — Telehealth: Payer: Self-pay | Admitting: Registered Nurse

## 2020-08-16 ENCOUNTER — Other Ambulatory Visit (HOSPITAL_COMMUNITY)
Admission: RE | Admit: 2020-08-16 | Discharge: 2020-08-16 | Disposition: A | Discharge status: Home / Self Care | Payer: No Typology Code available for payment source | Source: Ambulatory Visit | Attending: Gastroenterology | Admitting: Gastroenterology

## 2020-08-16 ENCOUNTER — Other Ambulatory Visit: Payer: Self-pay

## 2020-08-16 DIAGNOSIS — Z01812 Encounter for preprocedural laboratory examination: Secondary | ICD-10-CM | POA: Diagnosis present

## 2020-08-16 DIAGNOSIS — Z20822 Contact with and (suspected) exposure to covid-19: Secondary | ICD-10-CM | POA: Diagnosis not present

## 2020-08-16 LAB — SARS CORONAVIRUS 2 (TAT 6-24 HRS): SARS Coronavirus 2: NEGATIVE

## 2020-08-16 NOTE — Telephone Encounter (Signed)
.  Type of form received:FMLA  Additional comments:   Received QP:RFFMB Form should be Faxed to:[phone] Form should be mailed to:    Is patient requesting call for pickup:   Form placed:   In [employer] bin up front Engineering geologist.  Provider will determine charge.  Individual made aware of 3-5 business day turn around (Y/N)?

## 2020-08-18 ENCOUNTER — Other Ambulatory Visit: Payer: Self-pay

## 2020-08-18 ENCOUNTER — Encounter (HOSPITAL_COMMUNITY): Payer: Self-pay | Admitting: Gastroenterology

## 2020-08-18 ENCOUNTER — Encounter (HOSPITAL_COMMUNITY)
Admission: RE | Disposition: A | Discharge status: Home / Self Care | Payer: Self-pay | Source: Ambulatory Visit | Attending: Gastroenterology

## 2020-08-18 ENCOUNTER — Ambulatory Visit (HOSPITAL_COMMUNITY): Payer: No Typology Code available for payment source | Admitting: Anesthesiology

## 2020-08-18 ENCOUNTER — Ambulatory Visit (HOSPITAL_COMMUNITY)
Admission: RE | Admit: 2020-08-18 | Discharge: 2020-08-18 | Disposition: A | Discharge status: Home / Self Care | Payer: No Typology Code available for payment source | Source: Ambulatory Visit | Attending: Gastroenterology | Admitting: Gastroenterology

## 2020-08-18 DIAGNOSIS — D125 Benign neoplasm of sigmoid colon: Secondary | ICD-10-CM | POA: Insufficient documentation

## 2020-08-18 DIAGNOSIS — D509 Iron deficiency anemia, unspecified: Secondary | ICD-10-CM | POA: Diagnosis present

## 2020-08-18 DIAGNOSIS — K2970 Gastritis, unspecified, without bleeding: Secondary | ICD-10-CM | POA: Insufficient documentation

## 2020-08-18 DIAGNOSIS — Z8249 Family history of ischemic heart disease and other diseases of the circulatory system: Secondary | ICD-10-CM | POA: Diagnosis not present

## 2020-08-18 DIAGNOSIS — I10 Essential (primary) hypertension: Secondary | ICD-10-CM | POA: Insufficient documentation

## 2020-08-18 DIAGNOSIS — K259 Gastric ulcer, unspecified as acute or chronic, without hemorrhage or perforation: Secondary | ICD-10-CM | POA: Insufficient documentation

## 2020-08-18 DIAGNOSIS — K31819 Angiodysplasia of stomach and duodenum without bleeding: Secondary | ICD-10-CM | POA: Diagnosis not present

## 2020-08-18 DIAGNOSIS — F17210 Nicotine dependence, cigarettes, uncomplicated: Secondary | ICD-10-CM | POA: Diagnosis not present

## 2020-08-18 DIAGNOSIS — J449 Chronic obstructive pulmonary disease, unspecified: Secondary | ICD-10-CM | POA: Insufficient documentation

## 2020-08-18 DIAGNOSIS — Z836 Family history of other diseases of the respiratory system: Secondary | ICD-10-CM | POA: Diagnosis not present

## 2020-08-18 DIAGNOSIS — Z833 Family history of diabetes mellitus: Secondary | ICD-10-CM | POA: Diagnosis not present

## 2020-08-18 DIAGNOSIS — K648 Other hemorrhoids: Secondary | ICD-10-CM | POA: Diagnosis not present

## 2020-08-18 DIAGNOSIS — B9681 Helicobacter pylori [H. pylori] as the cause of diseases classified elsewhere: Secondary | ICD-10-CM | POA: Insufficient documentation

## 2020-08-18 DIAGNOSIS — K5520 Angiodysplasia of colon without hemorrhage: Secondary | ICD-10-CM | POA: Insufficient documentation

## 2020-08-18 DIAGNOSIS — K31A19 Gastric intestinal metaplasia without dysplasia, unspecified site: Secondary | ICD-10-CM | POA: Diagnosis not present

## 2020-08-18 DIAGNOSIS — Z8349 Family history of other endocrine, nutritional and metabolic diseases: Secondary | ICD-10-CM | POA: Diagnosis not present

## 2020-08-18 HISTORY — PX: BIOPSY: SHX5522

## 2020-08-18 HISTORY — PX: COLONOSCOPY WITH PROPOFOL: SHX5780

## 2020-08-18 HISTORY — PX: POLYPECTOMY: SHX5525

## 2020-08-18 HISTORY — PX: ENTEROSCOPY: SHX5533

## 2020-08-18 HISTORY — PX: HOT HEMOSTASIS: SHX5433

## 2020-08-18 HISTORY — PX: HEMOSTASIS CLIP PLACEMENT: SHX6857

## 2020-08-18 LAB — POCT I-STAT, CHEM 8
BUN: 6 mg/dL — ABNORMAL LOW (ref 8–23)
Calcium, Ion: 1.36 mmol/L (ref 1.15–1.40)
Chloride: 107 mmol/L (ref 98–111)
Creatinine, Ser: 0.6 mg/dL (ref 0.44–1.00)
Glucose, Bld: 75 mg/dL (ref 70–99)
HCT: 29 % — ABNORMAL LOW (ref 36.0–46.0)
Hemoglobin: 9.9 g/dL — ABNORMAL LOW (ref 12.0–15.0)
Potassium: 3.6 mmol/L (ref 3.5–5.1)
Sodium: 142 mmol/L (ref 135–145)
TCO2: 26 mmol/L (ref 22–32)

## 2020-08-18 SURGERY — COLONOSCOPY WITH PROPOFOL
Anesthesia: Monitor Anesthesia Care

## 2020-08-18 MED ORDER — PROPOFOL 1000 MG/100ML IV EMUL
INTRAVENOUS | Status: AC
  Filled 2020-08-18: qty 100

## 2020-08-18 MED ORDER — SODIUM CHLORIDE 0.9 % IV SOLN: INTRAVENOUS | Status: DC

## 2020-08-18 MED ORDER — LACTATED RINGERS IV SOLN: Freq: Once | INTRAVENOUS | Status: AC

## 2020-08-18 MED ORDER — LIDOCAINE HCL (CARDIAC) PF 100 MG/5ML IV SOSY
PREFILLED_SYRINGE | INTRAVENOUS | Status: DC | PRN
  Administered 2020-08-18: 60 mg via INTRAVENOUS

## 2020-08-18 MED ORDER — PROPOFOL 500 MG/50ML IV EMUL
INTRAVENOUS | Status: DC | PRN
  Administered 2020-08-18: 200 ug/kg/min via INTRAVENOUS

## 2020-08-18 MED ORDER — DEXMEDETOMIDINE (PRECEDEX) IN NS 20 MCG/5ML (4 MCG/ML) IV SYRINGE
PREFILLED_SYRINGE | INTRAVENOUS | Status: DC | PRN
  Administered 2020-08-18: 4 ug via INTRAVENOUS
  Administered 2020-08-18 (×2): 8 ug via INTRAVENOUS

## 2020-08-18 MED ORDER — PROPOFOL 10 MG/ML IV BOLUS
INTRAVENOUS | Status: DC | PRN
  Administered 2020-08-18 (×2): 20 mg via INTRAVENOUS

## 2020-08-18 MED ORDER — DEXMEDETOMIDINE (PRECEDEX) IN NS 20 MCG/5ML (4 MCG/ML) IV SYRINGE
PREFILLED_SYRINGE | INTRAVENOUS | Status: AC
  Filled 2020-08-18: qty 5

## 2020-08-18 SURGICAL SUPPLY — 25 items

## 2020-08-18 NOTE — Op Note (Signed)
[facility] Patient Name: Elizabeth Best Procedure Date: 08/18/2020 MRN: [id number] Attending MD: Carol Ada , MD Date of Birth: 12/29/1955 CSN: [id number] Age: 65 Admit Type: Outpatient Procedure:                Colonoscopy Indications:              Iron deficiency anemia Providers:                Carol Ada, MD, Kary Kos RN, RN, Fransico Setters                            [place], Technician Referring MD:              Medicines:                Propofol per Anesthesia Complications:            No immediate complications. Estimated Blood Loss:     Estimated blood loss was minimal. Procedure:                Pre-Anesthesia Assessment:                           - Prior to the procedure, a History and Physical                            was performed, and patient medications and                            allergies were reviewed. The patient's tolerance of                            previous anesthesia was also reviewed. The risks                            and benefits of the procedure and the sedation                            options and risks were discussed with the patient.                            All questions were answered, and informed consent                            was obtained. Prior Anticoagulants: The patient has                            taken no previous anticoagulant or antiplatelet                            agents. ASA Grade Assessment: III - A patient with                            severe systemic disease. After reviewing the risks  and benefits, the patient was deemed in                            satisfactory condition to undergo the procedure.                           - Sedation was administered by an anesthesia                            professional. Deep sedation was attained.                           After obtaining informed consent, the colonoscope                            was passed under direct vision.  Throughout the                            procedure, the patient's blood pressure, pulse, and                            oxygen saturations were monitored continuously. The                            [id number]) Olympus pediatric colonscope                            was introduced through the anus and advanced to the                            the cecum, identified by appendiceal orifice and                            ileocecal valve. The colonoscopy was performed                            without difficulty. The patient tolerated the                            procedure well. The quality of the bowel                            preparation was good. The ileocecal valve,                            appendiceal orifice, and rectum were photographed. Scope In: 11:22:45 AM Scope Out: 11:52:05 AM Scope Withdrawal Time: 0 hours 22 minutes 16 seconds  Total Procedure Duration: 0 hours 29 minutes 20 seconds  Findings:      A 2 mm polyp was found in the sigmoid colon. The polyp was sessile. The       polyp was removed with a cold snare. Resection and retrieval were       complete.      Multiple medium-sized patchy angiodysplastic lesions without bleeding       were found  in the cecum. Coagulation for tissue destruction using       monopolar probe was successful. For hemostasis, five hemostatic clips       were successfully placed (MR conditional). There was no bleeding at the       end of the procedure.      In the cecum there was evidence of multiple small to large AVMs. All the       medium to large AVMs were ablated with APC. Bleeding was induced and       hemoclips were deployed to arrest and prevent further bleeding.       Treatment of one of the AVMs resulted in a submucosal bleb that was then       filled with blood. The smaller punctate AVMs were treated, but they were       difficult to isolate. Most were ablated. Impression:               - One 2 mm polyp in the sigmoid  colon, removed with                            a cold snare. Resected and retrieved.                           - Multiple non-bleeding colonic angiodysplastic                            lesions. Treated with a monopolar probe. Clips (MR                            conditional) were placed. Moderate Sedation:      Not Applicable - Patient had care per Anesthesia. Recommendation:           - Patient has a contact number available for                            emergencies. The signs and symptoms of potential                            delayed complications were discussed with the                            patient. Return to normal activities tomorrow.                            Written discharge instructions were provided to the                            patient.                           - Resume previous diet.                           - Continue present medications.                           - Await pathology results.                           -  Repeat colonoscopy in 7 years for surveillance.                           - Return to GI clinic in 4 weeks with Dr. Collene Mares. Procedure Code(s):        --- Professional ---                           [phone], Colonoscopy, flexible; with ablation of                            tumor(s), polyp(s), or other lesion(s) (includes                            pre- and post-dilation and guide wire passage, when                            performed)                           45385, 59, Colonoscopy, flexible; with removal of                            tumor(s), polyp(s), or other lesion(s) by snare                            technique Diagnosis Code(s):        --- Professional ---                           K63.5, Polyp of colon                           K55.20, Angiodysplasia of colon without hemorrhage                           D50.9, Iron deficiency anemia, unspecified CPT copyright 2019 American Medical Association. All rights reserved. The codes documented  in this report are preliminary and upon coder review may  be revised to meet current compliance requirements. Carol Ada, MD Carol Ada, MD 08/18/2020 12:04:51 PM This report has been signed electronically. Number of Addenda: 0
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
## 2020-08-18 NOTE — Transfer of Care (Signed)
Immediate Anesthesia Transfer of Care Note  Patient: Elizabeth Best  Procedure(s) Performed: COLONOSCOPY WITH PROPOFOL (N/A ) HOT HEMOSTASIS (ARGON PLASMA COAGULATION/BICAP) (N/A ) ENTEROSCOPY (N/A ) BIOPSY HEMOSTASIS CLIP PLACEMENT POLYPECTOMY  Patient Location: Endoscopy Unit  Anesthesia Type:MAC  Level of Consciousness: drowsy  Airway & Oxygen Therapy: Patient Spontanous Breathing and Patient connected to face mask oxygen  Post-op Assessment: Report given to RN and Post -op Vital signs reviewed and stable  Post vital signs: Reviewed and stable  Last Vitals:  Vitals Value Taken Time  BP    Temp    Pulse 66 08/18/20 1158  Resp 15 08/18/20 1158  SpO2 100 % 08/18/20 1158  Vitals shown include unvalidated device data.  Last Pain:  Vitals:   08/18/20 1029  TempSrc: Oral  PainSc: 0-No pain         Complications: No complications documented.

## 2020-08-18 NOTE — Op Note (Addendum)
[facility] Patient Name: Elizabeth Best Procedure Date: 08/18/2020 MRN: [id number] Attending MD: Carol Ada , MD Date of Birth: 1955/07/25 CSN: [id number] Age: 65 Admit Type: Outpatient Procedure:                Small bowel enteroscopy Indications:              Iron deficiency anemia Providers:                Carol Ada, MD, Kary Kos RN, RN, Fransico Setters                            Mbumina, Technician Referring MD:              Medicines:                 Complications:            No immediate complications. Estimated Blood Loss:     Estimated blood loss: none. Procedure:                Pre-Anesthesia Assessment:                           - Prior to the procedure, a History and Physical                            was performed, and patient medications and                            allergies were reviewed. The patient's tolerance of                            previous anesthesia was also reviewed. The risks                            and benefits of the procedure and the sedation                            options and risks were discussed with the patient.                            All questions were answered, and informed consent                            was obtained. Prior Anticoagulants: The patient has                            taken no previous anticoagulant or antiplatelet                            agents. ASA Grade Assessment: III - A patient with                            severe systemic disease. After reviewing the risks                            and  benefits, the patient was deemed in                            satisfactory condition to undergo the procedure.                           - Sedation was administered by an anesthesia                            professional. Deep sedation was attained.                           After obtaining informed consent, the endoscope was                            passed under direct vision. Throughout the                             procedure, the patient's blood pressure, pulse, and                            oxygen saturations were monitored continuously. The                            PCF-[id number] ([id number]) Olympus pediatric colonscope                            was introduced through the mouth, and advanced to                            the small bowel distal to the Ligament of Treitz.                            After obtaining informed consent, the endoscope was                            passed under direct vision. Throughout the                            procedure, the patient's blood pressure, pulse, and                            oxygen saturations were monitored continuously.The                            small bowel enteroscopy was accomplished without                            difficulty. The patient tolerated the procedure                            well. Scope In: Scope Out: Findings:      The esophagus was normal.      Two non-bleeding superficial gastric ulcers were found at the incisura.  The largest lesion was 2 mm in largest dimension. Biopsies were taken       with a cold forceps for histology.      The examined duodenum was normal.      Multiple angiodysplastic lesions with no bleeding were found in the       proximal jejunum and in the mid-jejunum. Coagulation for tissue       destruction using monopolar probe was successful.      At the incisura two very slowly oozing small ulcers were identified.       Biopsies were obtained in the antrum and body for H. pylori. The pylorus       was mildly stenosed and it was dilated with passage of the colonoscopy.       In the jejunum several nonbleeding AVMs were ablated with APC. There was       also evidence of a healed ulcer in the duodenal bulb. A linear       ulceration was identified. Impression:               - Normal esophagus.                           - Non-bleeding gastric ulcers. Biopsied.                            - Normal examined duodenum.                           - Multiple non-bleeding angiodysplastic lesions in                            the jejunum. Treated with a monopolar probe. Recommendation:           - PPI QD.                           - Proceed with the colonoscopy. Procedure Code(s):        --- Professional ---                           [phone], Small intestinal endoscopy, enteroscopy                            beyond second portion of duodenum, not including                            ileum; with ablation of tumor(s), polyp(s), or                            other lesion(s) not amenable to removal by hot                            biopsy forceps, bipolar cautery or snare technique                           44361, Small intestinal endoscopy, enteroscopy  beyond second portion of duodenum, not including                            ileum; with biopsy, single or multiple Diagnosis Code(s):        --- Professional ---                           K25.9, Gastric ulcer, unspecified as acute or                            chronic, without hemorrhage or perforation                           K55.20, Angiodysplasia of colon without hemorrhage                           D50.9, Iron deficiency anemia, unspecified CPT copyright 2019 American Medical Association. All rights reserved. The codes documented in this report are preliminary and upon coder review may  be revised to meet current compliance requirements. Carol Ada, MD Carol Ada, MD 08/18/2020 12:10:52 PM This report has been signed electronically. Number of Addenda: 0

## 2020-08-18 NOTE — Anesthesia Procedure Notes (Signed)
Procedure Name: MAC Performed by: Lieutenant Diego, CRNA Pre-anesthesia Checklist: Patient identified, Emergency Drugs available, Suction available, Patient being monitored and Timeout performed Patient Re-evaluated:Patient Re-evaluated prior to induction Oxygen Delivery Method: Simple face mask Preoxygenation: Pre-oxygenation with 100% oxygen Induction Type: IV induction

## 2020-08-18 NOTE — Anesthesia Preprocedure Evaluation (Signed)
Anesthesia Evaluation  Patient identified by MRN, date of birth, ID band Patient awake    Reviewed: Allergy & Precautions, NPO status , Patient's Chart, lab work & pertinent test results  History of Anesthesia Complications Negative for: history of anesthetic complications  Airway Mallampati: II  TM Distance: >3 FB Neck ROM: Full    Dental  (+) Teeth Intact, Missing,    Pulmonary COPD,  COPD inhaler, Current SmokerPatient did not abstain from smoking.,  Covid-19 Nucleic Acid Test Results Lab Results      Component                Value               Date                      SARSCOV2NAA              NEGATIVE            08/16/2020                [place]              Not Detected        11/25/2018              breath sounds clear to auscultation       Cardiovascular hypertension, Pt. on medications (-) angina Rhythm:Regular     Neuro/Psych PSYCHIATRIC DISORDERS Depression negative neurological ROS     GI/Hepatic Neg liver ROS, ? Gi bleed   Endo/Other  negative endocrine ROS  Renal/GU negative Renal ROS     Musculoskeletal negative musculoskeletal ROS (+)   Abdominal   Peds  Hematology  (+) Blood dyscrasia, anemia ,   Anesthesia Other Findings   Reproductive/Obstetrics                             Anesthesia Physical Anesthesia Plan  ASA: III  Anesthesia Plan: MAC   Post-op Pain Management:    Induction: Intravenous  PONV Risk Score and Plan: 1 and Propofol infusion  Airway Management Planned: Nasal Cannula  Additional Equipment: None  Intra-op Plan:   Post-operative Plan:   Informed Consent: I have reviewed the patients History and Physical, chart, labs and discussed the procedure including the risks, benefits and alternatives for the proposed anesthesia with the patient or authorized representative who has indicated his/her understanding and acceptance.     Dental  advisory given  Plan Discussed with: CRNA  Anesthesia Plan Comments:         Anesthesia Quick Evaluation

## 2020-08-18 NOTE — Discharge Instructions (Signed)
      [facility] ENDOSCOPY [address] [place], [place]  [address] Phone:  [phone]   August 18, 2020  Patient: Elizabeth Best  Date of Birth: Aug 06, 1955  Date of Visit: August 18, 2020    To Whom It May Concern:  Elizabeth Best was seen and treated on August 18, 2020 she may return to work on Monday, May 2nd with no restrictions from her procedure on August 18, 2020.    .           If you have any questions or concerns, please don't hesitate to call [phone].   Sincerely,       Treatment Team:  Attending Provider: Carol Ada, MD  [phone]       YOU HAD AN ENDOSCOPIC PROCEDURE TODAY: Refer to the procedure report and other information in the discharge instructions given to you for any specific questions about what was found during the examination. If this information does not answer your questions, please call [place] at [phone] to clarify.   YOU SHOULD EXPECT: Some feelings of bloating in the abdomen. Passage of more gas than usual. Walking can help get rid of the air that was put into your GI tract during the procedure and reduce the bloating. If you had a lower endoscopy (such as a colonoscopy or flexible sigmoidoscopy) you may notice spotting of blood in your stool or on the toilet paper. Some abdominal soreness may be present for a day or two, also.  DIET: Your first meal following the procedure should be a light meal and then it is ok to progress to your normal diet. A half-sandwich or bowl of soup is an example of a good first meal. Heavy or fried foods are harder to digest and may make you feel nauseous or bloated. Drink plenty of fluids but you should avoid alcoholic beverages for 24 hours. If you had an esophageal dilation, please see attached information for diet.   ACTIVITY: Your care partner should take you home directly after the procedure. You should plan to take it easy, moving slowly for the rest of  the day. You can resume normal activity the day after the procedure however YOU SHOULD NOT DRIVE, use power tools, machinery or perform tasks that involve climbing or major physical exertion for 24 hours (because of the sedation medicines used during the test).   SYMPTOMS TO REPORT IMMEDIATELY: A gastroenterologist can be reached at any hour. Please call [phone]  for any of the following symptoms:  Following lower endoscopy (colonoscopy, flexible sigmoidoscopy) Excessive amounts of blood in the stool  Significant tenderness, worsening of abdominal pains  Swelling of the abdomen that is new, acute  Fever of 100 or higher  Following upper endoscopy (EGD, EUS, ERCP, esophageal dilation) Vomiting of blood or coffee ground material  New, significant abdominal pain  New, significant chest pain or pain under the shoulder blades  Painful or persistently difficult swallowing  New shortness of breath  Black, tarry-looking or red, bloody stools  FOLLOW UP:  If any biopsies were taken you will be contacted by phone or by letter within the next 1-3 weeks. Call [phone]  if you have not heard about the biopsies in 3 weeks.  Please also call with any specific questions about appointments or follow up tests.

## 2020-08-21 ENCOUNTER — Encounter (HOSPITAL_COMMUNITY): Payer: Self-pay | Admitting: Gastroenterology

## 2020-08-21 ENCOUNTER — Telehealth: Payer: Self-pay | Admitting: Registered Nurse

## 2020-08-21 LAB — SURGICAL PATHOLOGY

## 2020-08-21 NOTE — Telephone Encounter (Signed)
Elizabeth Best has this paperwork.

## 2020-08-21 NOTE — Telephone Encounter (Signed)
..  Type of form received:FMLA  Additional comments:   Received by:Sarah Form should be Faxed to:[phone] Form should be mailed to:    Is patient requesting call for pickup:   Form placed:   In Richard Morrow's bin up front Attach charge sheet.  Provider will determine charge.  Individual made aware of 3-5 business day turn around (Y/N)?   

## 2020-08-22 NOTE — Telephone Encounter (Signed)
Patient called stating she had to have surgery through Dr. Lorie Apley office.  States [employer] is harassing her to have Richard complete forms.   States she missed work on April 13th, 19th, 26th, 27th, 28th, and 29th.  States she had appts on 13th, 19th and surgery was on 26th.    Is needing to be written out of work for these dates .     Patient is requesting a call back in regard at [phone].

## 2020-08-22 NOTE — Anesthesia Postprocedure Evaluation (Signed)
Anesthesia Post Note  Patient: Elizabeth Best  Procedure(s) Performed: COLONOSCOPY WITH PROPOFOL (N/A ) HOT HEMOSTASIS (ARGON PLASMA COAGULATION/BICAP) (N/A ) ENTEROSCOPY (N/A ) BIOPSY HEMOSTASIS CLIP PLACEMENT POLYPECTOMY     Patient location during evaluation: Endoscopy Anesthesia Type: MAC Level of consciousness: awake and alert Pain management: pain level controlled Vital Signs Assessment: post-procedure vital signs reviewed and stable Respiratory status: spontaneous breathing, nonlabored ventilation, respiratory function stable and patient connected to nasal cannula oxygen Cardiovascular status: stable and blood pressure returned to baseline Postop Assessment: no apparent nausea or vomiting Anesthetic complications: no   No complications documented.  Last Vitals:  Vitals:   08/18/20 1210 08/18/20 1220  BP: (!) 149/87 (!) 165/79  Pulse: 63 65  Resp: 20 20  Temp:    SpO2: 96% 97%    Last Pain:  Vitals:   08/18/20 1210  TempSrc:   PainSc: 5                  Loriann Bosserman

## 2020-08-24 ENCOUNTER — Other Ambulatory Visit (HOSPITAL_COMMUNITY): Payer: Self-pay

## 2020-08-24 MED ORDER — OMEPRAZOLE 40 MG PO CPDR
40.0000 mg | DELAYED_RELEASE_CAPSULE | Freq: Two times a day (BID) | ORAL | 0 refills | Status: DC
  Filled 2020-08-24: qty 28, 14d supply, fill #0

## 2020-08-24 MED ORDER — DOXYCYCLINE HYCLATE 100 MG PO CAPS
100.0000 mg | ORAL_CAPSULE | Freq: Two times a day (BID) | ORAL | 0 refills | Status: DC
  Filled 2020-08-24: qty 28, 14d supply, fill #0

## 2020-08-24 MED ORDER — METRONIDAZOLE 500 MG PO TABS
500.0000 mg | ORAL_TABLET | Freq: Four times a day (QID) | ORAL | 0 refills | Status: DC
  Filled 2020-08-24: qty 56, 14d supply, fill #0

## 2020-08-25 ENCOUNTER — Telehealth: Payer: Self-pay

## 2020-08-25 NOTE — Telephone Encounter (Signed)
Returned call to pt. Confirmed with pt that we have her FMLA paperwork and it is being processed. Pt anxious about getting it done. Reassured pt it would be processed and faxed. Pt verbalized understanding.

## 2020-08-25 NOTE — Telephone Encounter (Signed)
Patient called in stating she stopped by the office earlier and spoke with Judson Roch about needing a call back in regards to the paperwork. Was told that she would receive a phone about lunch time and hasnt heard anything and is wanting yo follow up on this.

## 2020-08-28 NOTE — Telephone Encounter (Signed)
Is this paperwork completed if so I will make a copy and call patient .

## 2020-09-04 NOTE — Telephone Encounter (Signed)
Unsure of details of what patient is needing out of this paperwork. Does she need continuous leave or intermittent? If continuous, what dates? If intermittent, how frequent?  Thank you  Rich

## 2020-09-06 NOTE — Telephone Encounter (Signed)
This patients paperwork was already completed and faxed to her job .

## 2020-09-12 ENCOUNTER — Telehealth: Payer: Self-pay | Admitting: *Deleted

## 2020-09-12 NOTE — Telephone Encounter (Signed)
Connected with Lissa Merlin [phone]) regarding [employer] FMLA fax received by this forms nurse 08/18/2020 for clarification of leave parameters for revisions as needed. Form returned to this nurse today.  Ineligible for provider certification of intermittent FMLA allowing up to three days out of work per week for flare ups.       "FMLA was a one time request for coverage to prevent attendance points being out of work per another provider for three or four days and appointments there.  I am covered for previous days out of work so disregard the form.   I work for [employer].  This office is associated with [employer] is why form was sent to Dr. Irene Limbo.  It took so long my other provider completed my FMLA request. Need a letter for next appointment (09/22/2020) and all appointments going forward for employer.  No time or duration required, just the date confirming I was there for an appointment.    Apologized for delay.  Form completed by Joy LPN.  This nurse out 08/31/2020 through 09/11/2020.   Routing information to collaborative and provider to notify letters are needed for all completed [facility] appointments by employer to avoid attendance points.

## 2020-09-14 ENCOUNTER — Encounter: Payer: Self-pay | Admitting: Hematology
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
## 2020-09-22 ENCOUNTER — Inpatient Hospital Stay: Payer: No Typology Code available for payment source | Attending: Hematology | Admitting: Hematology

## 2020-09-22 ENCOUNTER — Other Ambulatory Visit: Payer: Self-pay

## 2020-09-22 ENCOUNTER — Inpatient Hospital Stay: Payer: No Typology Code available for payment source

## 2020-09-22 VITALS — BP 145/88 | HR 66 | Temp 98.2°F | Resp 18 | Wt 184.0 lb

## 2020-09-22 DIAGNOSIS — K922 Gastrointestinal hemorrhage, unspecified: Secondary | ICD-10-CM | POA: Insufficient documentation

## 2020-09-22 DIAGNOSIS — D509 Iron deficiency anemia, unspecified: Secondary | ICD-10-CM | POA: Insufficient documentation

## 2020-09-22 DIAGNOSIS — F17210 Nicotine dependence, cigarettes, uncomplicated: Secondary | ICD-10-CM | POA: Diagnosis not present

## 2020-09-22 DIAGNOSIS — D75839 Thrombocytosis, unspecified: Secondary | ICD-10-CM

## 2020-09-22 LAB — CBC WITH DIFFERENTIAL/PLATELET
Abs Immature Granulocytes: 0.02 10*3/uL (ref 0.00–0.07)
Basophils Absolute: 0 10*3/uL (ref 0.0–0.1)
Basophils Relative: 1 %
Eosinophils Absolute: 0.2 10*3/uL (ref 0.0–0.5)
Eosinophils Relative: 3 %
HCT: 36.3 % (ref 36.0–46.0)
Hemoglobin: 10.9 g/dL — ABNORMAL LOW (ref 12.0–15.0)
Immature Granulocytes: 0 %
Lymphocytes Relative: 25 %
Lymphs Abs: 2.1 10*3/uL (ref 0.7–4.0)
MCH: 20 pg — ABNORMAL LOW (ref 26.0–34.0)
MCHC: 30 g/dL (ref 30.0–36.0)
MCV: 66.5 fL — ABNORMAL LOW (ref 80.0–100.0)
Monocytes Absolute: 0.5 10*3/uL (ref 0.1–1.0)
Monocytes Relative: 6 %
Neutro Abs: 5.6 10*3/uL (ref 1.7–7.7)
Neutrophils Relative %: 65 %
Platelets: 420 10*3/uL — ABNORMAL HIGH (ref 150–400)
RBC: 5.46 MIL/uL — ABNORMAL HIGH (ref 3.87–5.11)
RDW: 23.9 % — ABNORMAL HIGH (ref 11.5–15.5)
WBC: 8.5 10*3/uL (ref 4.0–10.5)
nRBC: 0 % (ref 0.0–0.2)

## 2020-09-22 LAB — CMP (CANCER CENTER ONLY)
ALT: 27 U/L (ref 0–44)
AST: 16 U/L (ref 15–41)
Albumin: 3.6 g/dL (ref 3.5–5.0)
Alkaline Phosphatase: 99 U/L (ref 38–126)
Anion gap: 9 (ref 5–15)
BUN: 7 mg/dL — ABNORMAL LOW (ref 8–23)
CO2: 22 mmol/L (ref 22–32)
Calcium: 10.4 mg/dL — ABNORMAL HIGH (ref 8.9–10.3)
Chloride: 109 mmol/L (ref 98–111)
Creatinine: 0.69 mg/dL (ref 0.44–1.00)
GFR, Estimated: >60 mL/min (ref >60)
Glucose, Bld: 88 mg/dL (ref 70–99)
Potassium: 3.8 mmol/L (ref 3.5–5.1)
Sodium: 140 mmol/L (ref 135–145)
Total Bilirubin: <0.2 mg/dL — ABNORMAL LOW (ref 0.3–1.2)
Total Protein: 7.2 g/dL (ref 6.5–8.1)

## 2020-09-22 LAB — IRON AND TIBC
Iron: 28 ug/dL — ABNORMAL LOW (ref 41–142)
Saturation Ratios: 7 % — ABNORMAL LOW (ref 21–57)
TIBC: 380 ug/dL (ref 236–444)
UIBC: 352 ug/dL (ref 120–384)

## 2020-09-22 LAB — VITAMIN B12: Vitamin B-12: 516 pg/mL (ref 180–914)

## 2020-09-22 LAB — FERRITIN: Ferritin: 12 ng/mL (ref 11–307)

## 2020-09-25 ENCOUNTER — Telehealth: Payer: Self-pay | Admitting: Hematology

## 2020-09-25 NOTE — Telephone Encounter (Signed)
Scheduled follow-up appointments per 6/3 los. Patient is aware.

## 2020-09-26 ENCOUNTER — Other Ambulatory Visit: Payer: Self-pay

## 2020-09-26 ENCOUNTER — Encounter: Payer: Self-pay | Admitting: Registered Nurse

## 2020-09-26 ENCOUNTER — Ambulatory Visit (INDEPENDENT_AMBULATORY_CARE_PROVIDER_SITE_OTHER): Payer: No Typology Code available for payment source | Admitting: Registered Nurse

## 2020-09-26 VITALS — BP 132/77 | HR 93 | Temp 98.2°F | Resp 18 | Ht 63.0 in | Wt 184.2 lb

## 2020-09-26 DIAGNOSIS — D500 Iron deficiency anemia secondary to blood loss (chronic): Secondary | ICD-10-CM
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
## 2020-09-26 NOTE — Patient Instructions (Signed)
° ° ° °  If you have lab work done today you will be contacted with your lab results within the next 2 weeks.  If you have not heard from us then please contact us. The fastest way to get your results is to register for My Chart. ° ° °IF you received an x-ray today, you will receive an invoice from [facility]. Please contact [facility] at [phone] with questions or concerns regarding your invoice.  ° °IF you received labwork today, you will receive an invoice from [facility]. Please contact [facility] at [phone] with questions or concerns regarding your invoice.  ° °Our billing staff will not be able to assist you with questions regarding bills from these companies. ° °You will be contacted with the lab results as soon as they are available. The fastest way to get your results is to activate your My Chart account. Instructions are located on the last page of this paperwork. If you have not heard from us regarding the results in 2 weeks, please contact this office. °  ° ° ° °

## 2020-09-28 ENCOUNTER — Encounter: Payer: Self-pay | Admitting: Hematology

## 2020-10-02 ENCOUNTER — Telehealth: Payer: Self-pay | Admitting: Registered Nurse

## 2020-10-02 NOTE — Telephone Encounter (Signed)
Patient would like to be seen for a physical before October 20, 2020

## 2020-10-03 ENCOUNTER — Encounter: Payer: Self-pay | Admitting: Hematology

## 2020-10-06 ENCOUNTER — Other Ambulatory Visit: Payer: Self-pay

## 2020-10-06 ENCOUNTER — Inpatient Hospital Stay: Payer: No Typology Code available for payment source

## 2020-10-06 VITALS — BP 122/67 | HR 70 | Temp 98.0°F | Resp 16

## 2020-10-06 DIAGNOSIS — D509 Iron deficiency anemia, unspecified: Secondary | ICD-10-CM | POA: Diagnosis not present

## 2020-10-06 MED ORDER — LORATADINE 10 MG PO TABS
10.0000 mg | ORAL_TABLET | Freq: Once | ORAL | Status: AC
  Administered 2020-10-06: 10 mg via ORAL

## 2020-10-06 MED ORDER — ACETAMINOPHEN 325 MG PO TABS
ORAL_TABLET | ORAL | Status: AC
  Filled 2020-10-06: qty 2

## 2020-10-06 MED ORDER — LORATADINE 10 MG PO TABS
ORAL_TABLET | ORAL | Status: AC
  Filled 2020-10-06: qty 1

## 2020-10-06 MED ORDER — ACETAMINOPHEN 325 MG PO TABS
650.0000 mg | ORAL_TABLET | Freq: Once | ORAL | Status: AC
  Administered 2020-10-06: 650 mg via ORAL

## 2020-10-06 MED ORDER — SODIUM CHLORIDE 0.9 % IV SOLN
Freq: Once | INTRAVENOUS | Status: AC
  Filled 2020-10-06: qty 250

## 2020-10-06 MED ORDER — SODIUM CHLORIDE 0.9 % IV SOLN
300.0000 mg | Freq: Once | INTRAVENOUS | Status: AC
  Administered 2020-10-06: 300 mg via INTRAVENOUS
  Filled 2020-10-06: qty 300

## 2020-10-06 NOTE — Progress Notes (Signed)
Pt tolerated treatment well.  VSS at infusion completion.  Pt has no prior hx of reaction.  Pt refused to wait 30 minutes post obs.

## 2020-10-09 NOTE — Telephone Encounter (Signed)
I have LM asking pt to call back to get a cpe scheduled with Richard before 10/20/20. Please make her a spot per Tammy.

## 2020-10-13 ENCOUNTER — Inpatient Hospital Stay: Payer: No Typology Code available for payment source

## 2020-10-13 ENCOUNTER — Other Ambulatory Visit: Payer: Self-pay

## 2020-10-13 VITALS — BP 142/65 | HR 65 | Temp 97.9°F | Resp 17

## 2020-10-13 DIAGNOSIS — D509 Iron deficiency anemia, unspecified: Secondary | ICD-10-CM

## 2020-10-13 MED ORDER — ACETAMINOPHEN 325 MG PO TABS
ORAL_TABLET | ORAL | Status: AC
  Filled 2020-10-13: qty 2

## 2020-10-13 MED ORDER — SODIUM CHLORIDE 0.9 % IV SOLN
Freq: Once | INTRAVENOUS | Status: AC
  Filled 2020-10-13: qty 250

## 2020-10-13 MED ORDER — ACETAMINOPHEN 325 MG PO TABS
650.0000 mg | ORAL_TABLET | Freq: Once | ORAL | Status: AC
  Administered 2020-10-13: 650 mg via ORAL

## 2020-10-13 MED ORDER — LORATADINE 10 MG PO TABS
10.0000 mg | ORAL_TABLET | Freq: Once | ORAL | Status: AC
  Administered 2020-10-13: 10 mg via ORAL

## 2020-10-13 MED ORDER — SODIUM CHLORIDE 0.9 % IV SOLN
300.0000 mg | Freq: Once | INTRAVENOUS | Status: AC
  Administered 2020-10-13: 300 mg via INTRAVENOUS
  Filled 2020-10-13: qty 300

## 2020-10-13 MED ORDER — LORATADINE 10 MG PO TABS
ORAL_TABLET | ORAL | Status: AC
  Filled 2020-10-13: qty 1

## 2020-10-13 NOTE — Patient Instructions (Signed)

## 2020-10-16 ENCOUNTER — Other Ambulatory Visit (HOSPITAL_COMMUNITY): Payer: Self-pay

## 2020-10-16 ENCOUNTER — Encounter: Payer: Self-pay | Admitting: Registered Nurse

## 2020-10-16 ENCOUNTER — Ambulatory Visit (INDEPENDENT_AMBULATORY_CARE_PROVIDER_SITE_OTHER): Payer: No Typology Code available for payment source | Admitting: Registered Nurse

## 2020-10-16 ENCOUNTER — Other Ambulatory Visit: Payer: Self-pay

## 2020-10-16 VITALS — BP 128/61 | HR 88 | Temp 98.0°F | Resp 18 | Ht 63.0 in | Wt 186.0 lb

## 2020-10-16 DIAGNOSIS — K2970 Gastritis, unspecified, without bleeding: Secondary | ICD-10-CM | POA: Diagnosis not present

## 2020-10-16 DIAGNOSIS — B9681 Helicobacter pylori [H. pylori] as the cause of diseases classified elsewhere: Secondary | ICD-10-CM | POA: Diagnosis not present

## 2020-10-16 DIAGNOSIS — M5432 Sciatica, left side: Secondary | ICD-10-CM | POA: Diagnosis not present

## 2020-10-16 DIAGNOSIS — Z0000 Encounter for general adult medical examination without abnormal findings: Secondary | ICD-10-CM | POA: Diagnosis not present

## 2020-10-16 MED ORDER — METHOCARBAMOL 500 MG PO TABS
500.0000 mg | ORAL_TABLET | Freq: Four times a day (QID) | ORAL | 0 refills | Status: DC
Start: 2020-10-16 — End: 2020-12-05
  Filled 2020-10-16: qty 60, 15d supply, fill #0

## 2020-10-16 MED ORDER — TRAMADOL HCL 50 MG PO TABS
50.0000 mg | ORAL_TABLET | Freq: Three times a day (TID) | ORAL | 0 refills | Status: AC | PRN
  Filled 2020-10-16: qty 15, 5d supply, fill #0

## 2020-10-16 NOTE — Patient Instructions (Addendum)
Ms. Gale Klar to see you. In brief -  H pylori is a bacteria that lives in the stomach and can contribute to ulcers. The antibiotics you were on (starting on May 5) should have eradicated this To confirm that the bacteria is gone, we will collect a breath test at a [facility] location (see below). If the bacteria is still there, we will want to pursue another round of treatment. I will contact you to detail this course should we need it.   Labs today: CBC (blood counts), Iron, Lipid Panel (Cholesterol) Medications today: methocarbamol (muscle relaxer) and tramadol (pain reliever). Please use these only when you need them.   Blood pressure is great today. No need to start medication at this time. Lipids looked great last time - we'll check again today.   Let me know if you have any questions  Thank you  Rich      PS [facility] locations:  [place], [place], [place] [address]  [place], [place] [address]      If you have lab work done today you will be contacted with your lab results within the next 2 weeks.  If you have not heard from [place] then please contact us. The fastest way to get your results is to register for My Chart.   IF you received an x-ray today, you will receive an invoice from [facility]. Please contact [facility] at [phone] with questions or concerns regarding your invoice.   IF you received labwork today, you will receive an invoice from [facility]. Please contact [facility] at [phone] with questions or concerns regarding your invoice.   Our billing staff will not be able to assist you with questions regarding bills from these companies.  You will be contacted with the lab results as soon as they are available. The fastest way to get your results is to activate your My Chart account. Instructions are located on the last page of this paperwork. If you have not heard from [place] regarding the  results in 2 weeks, please contact this office.

## 2020-10-17 ENCOUNTER — Other Ambulatory Visit: Payer: Self-pay | Admitting: Registered Nurse

## 2020-10-18 ENCOUNTER — Other Ambulatory Visit: Payer: Self-pay | Admitting: Registered Nurse

## 2020-10-18 ENCOUNTER — Other Ambulatory Visit (HOSPITAL_COMMUNITY): Payer: Self-pay

## 2020-10-18 DIAGNOSIS — E7800 Pure hypercholesterolemia, unspecified: Secondary | ICD-10-CM

## 2020-10-18 LAB — CBC WITH DIFFERENTIAL/PLATELET
Basophils Absolute: 0 10*3/uL (ref 0.0–0.2)
Basos: 0 %
EOS (ABSOLUTE): 0.2 10*3/uL (ref 0.0–0.4)
Eos: 2 %
Hematocrit: 39.1 % (ref 34.0–46.6)
Hemoglobin: 11.8 g/dL (ref 11.1–15.9)
Immature Grans (Abs): 0 10*3/uL (ref 0.0–0.1)
Immature Granulocytes: 0 %
Lymphocytes Absolute: 2.1 10*3/uL (ref 0.7–3.1)
Lymphs: 22 %
MCH: 21.1 pg — ABNORMAL LOW (ref 26.6–33.0)
MCHC: 30.2 g/dL — ABNORMAL LOW (ref 31.5–35.7)
MCV: 70 fL — ABNORMAL LOW (ref 79–97)
Monocytes Absolute: 0.5 10*3/uL (ref 0.1–0.9)
Monocytes: 6 %
Neutrophils Absolute: 6.8 10*3/uL (ref 1.4–7.0)
Neutrophils: 70 %
Platelets: 296 10*3/uL (ref 150–450)
RBC: 5.58 x10E6/uL — ABNORMAL HIGH (ref 3.77–5.28)
RDW: 24.6 % — ABNORMAL HIGH (ref 11.7–15.4)
WBC: 9.7 10*3/uL (ref 3.4–10.8)

## 2020-10-18 LAB — LIPID PANEL
Chol/HDL Ratio: 6.4 ratio — ABNORMAL HIGH (ref 0.0–4.4)
Cholesterol, Total: 219 mg/dL — ABNORMAL HIGH (ref 100–199)
HDL: 34 mg/dL — ABNORMAL LOW (ref >39)
LDL Chol Calc (NIH): 149 mg/dL — ABNORMAL HIGH (ref 0–99)
Triglycerides: 198 mg/dL — ABNORMAL HIGH (ref 0–149)
VLDL Cholesterol Cal: 36 mg/dL (ref 5–40)

## 2020-10-18 LAB — IRON,TIBC AND FERRITIN PANEL
Ferritin: 327 ng/mL — ABNORMAL HIGH (ref 15–150)
Iron Saturation: 13 % — ABNORMAL LOW (ref 15–55)
Iron: 48 ug/dL (ref 27–139)
Total Iron Binding Capacity: 370 ug/dL (ref 250–450)
UIBC: 322 ug/dL (ref 118–369)

## 2020-10-18 LAB — H. PYLORI BREATH TEST: H pylori Breath Test: NEGATIVE

## 2020-10-18 MED ORDER — ATORVASTATIN CALCIUM 20 MG PO TABS
20.0000 mg | ORAL_TABLET | Freq: Every day | ORAL | 1 refills | Status: DC
  Filled 2020-10-18: qty 90, 90d supply, fill #0
  Filled 2021-02-27: qty 90, 90d supply, fill #1

## 2020-10-20 ENCOUNTER — Inpatient Hospital Stay: Payer: No Typology Code available for payment source | Attending: Physician Assistant

## 2020-10-20 ENCOUNTER — Other Ambulatory Visit: Payer: Self-pay

## 2020-10-20 ENCOUNTER — Telehealth: Payer: Self-pay | Admitting: Registered Nurse

## 2020-10-20 VITALS — BP 155/76 | HR 64 | Temp 98.3°F | Resp 18

## 2020-10-20 DIAGNOSIS — D509 Iron deficiency anemia, unspecified: Secondary | ICD-10-CM | POA: Diagnosis not present

## 2020-10-20 MED ORDER — LORATADINE 10 MG PO TABS
10.0000 mg | ORAL_TABLET | Freq: Once | ORAL | Status: AC
Start: 2020-10-20 — End: 2020-10-20
  Administered 2020-10-20: 10 mg via ORAL

## 2020-10-20 MED ORDER — SODIUM CHLORIDE 0.9 % IV SOLN
Freq: Once | INTRAVENOUS | Status: AC
  Filled 2020-10-20: qty 250

## 2020-10-20 MED ORDER — SODIUM CHLORIDE 0.9 % IV SOLN
300.0000 mg | Freq: Once | INTRAVENOUS | Status: AC
  Administered 2020-10-20: 300 mg via INTRAVENOUS
  Filled 2020-10-20: qty 300

## 2020-10-20 MED ORDER — LORATADINE 10 MG PO TABS
ORAL_TABLET | ORAL | Status: AC
  Filled 2020-10-20: qty 1

## 2020-10-20 MED ORDER — ACETAMINOPHEN 325 MG PO TABS
ORAL_TABLET | ORAL | Status: AC
  Filled 2020-10-20: qty 2

## 2020-10-20 MED ORDER — ACETAMINOPHEN 325 MG PO TABS
650.0000 mg | ORAL_TABLET | Freq: Once | ORAL | Status: AC
Start: 2020-10-20 — End: 2020-10-20
  Administered 2020-10-20: 650 mg via ORAL

## 2020-10-20 NOTE — Telephone Encounter (Signed)
Pt requesting TOC from Maximiano Coss to [facility] due to [place] being closer for her. Is this okay?

## 2020-10-20 NOTE — Progress Notes (Signed)
Patient refused 30 min post iron infusion observation. VSS at discharge

## 2020-10-20 NOTE — Patient Instructions (Signed)

## 2020-10-24 ENCOUNTER — Telehealth: Payer: Self-pay | Admitting: Registered Nurse

## 2020-10-24 NOTE — Telephone Encounter (Signed)
..  Type of form received:  FMLA  Additional comments:   Received by:    [phone]  Form should be Faxed to:  Form should be mailed to:    Is patient requesting call for pickup:   Form placed:  In Richard's bin up front  Attach charge sheet.  Provider will determine charge.  Individual made aware of 3-5 business day turn around (Y/N)?

## 2020-10-26 ENCOUNTER — Encounter: Payer: Self-pay | Admitting: Registered Nurse

## 2020-10-26 NOTE — Telephone Encounter (Signed)
Have sent pt message to clarify info - will fill out when we have that info back Thanks  Rich

## 2020-10-26 NOTE — Telephone Encounter (Signed)
She's very pleasant - I think she'd be a good fit for almost anyone Thanks,  Denice Paradise

## 2020-10-30 ENCOUNTER — Telehealth: Payer: Self-pay

## 2020-10-30 NOTE — Telephone Encounter (Signed)
Pt called she can not get access to her mychart at this time but she wanted to inform Delfino Lovett that she needs this for intermittent FMLA 2 days a month for a year from 10/17/20 for anemic and iron.   Pt had to wait to be with [employer] for a year on 06/28 before she was eligible to receive FMLA   Pt call back [phone]

## 2020-10-30 NOTE — Telephone Encounter (Signed)
Pt called she can not get access to her mychart at this time but she wanted to inform Delfino Lovett that she needs this for intermittent FMLA 2 days a month for a year from 10/17/20 for anemic and iron.   Pt had to wait to be with [employer] for a year on 06/28 before she was eligible to receive FMLA   Pt call back [phone]

## 2020-10-30 NOTE — Telephone Encounter (Signed)
Pt called she can not get access to her mychart at this time but she wanted to inform Delfino Lovett that she needs this for intermittent FMLA 2 days a month for a year from 10/17/20 for anemic and iron.   Pt had to wait to be with [employer] for a year on 06/28 before she was eligible to receive FMLA

## 2020-10-31 DIAGNOSIS — Z0279 Encounter for issue of other medical certificate: Secondary | ICD-10-CM

## 2020-10-31 NOTE — Telephone Encounter (Signed)
Forms completed - will be faxed today  Thank you  Rich

## 2020-11-07 ENCOUNTER — Other Ambulatory Visit (HOSPITAL_COMMUNITY): Payer: Self-pay

## 2020-11-07 ENCOUNTER — Other Ambulatory Visit: Payer: Self-pay

## 2020-11-07 ENCOUNTER — Encounter: Payer: Self-pay | Admitting: Registered Nurse

## 2020-11-07 ENCOUNTER — Telehealth (INDEPENDENT_AMBULATORY_CARE_PROVIDER_SITE_OTHER): Payer: No Typology Code available for payment source | Admitting: Registered Nurse

## 2020-11-07 ENCOUNTER — Encounter: Payer: Self-pay | Admitting: Hematology

## 2020-11-07 DIAGNOSIS — J22 Unspecified acute lower respiratory infection: Secondary | ICD-10-CM | POA: Diagnosis not present

## 2020-11-07 DIAGNOSIS — L709 Acne, unspecified: Secondary | ICD-10-CM

## 2020-11-07 DIAGNOSIS — R0981 Nasal congestion: Secondary | ICD-10-CM

## 2020-11-07 DIAGNOSIS — J449 Chronic obstructive pulmonary disease, unspecified: Secondary | ICD-10-CM

## 2020-11-07 MED ORDER — PREDNISONE 10 MG (21) PO TBPK
ORAL_TABLET | ORAL | 0 refills | Status: DC
  Filled 2020-11-07: qty 21, 6d supply, fill #0

## 2020-11-07 MED ORDER — ALBUTEROL SULFATE HFA 108 (90 BASE) MCG/ACT IN AERS
2.0000 | INHALATION_SPRAY | Freq: Four times a day (QID) | RESPIRATORY_TRACT | 6 refills | Status: DC | PRN
Start: 2020-11-07 — End: 2022-01-21
  Filled 2020-11-07: qty 18, 25d supply, fill #0
  Filled 2021-02-27: qty 18, 25d supply, fill #1
  Filled 2021-03-29: qty 18, 25d supply, fill #2
  Filled 2021-06-28: qty 18, 25d supply, fill #3
  Filled 2021-07-18: qty 18, 25d supply, fill #4
  Filled 2021-09-28: qty 18, 25d supply, fill #5
  Filled 2021-11-01: qty 18, 25d supply, fill #6

## 2020-11-07 MED ORDER — CLINDAMYCIN PHOS-BENZOYL PEROX 1-5 % EX GEL
1.0000 "application " | Freq: Two times a day (BID) | CUTANEOUS | 0 refills | Status: DC
  Filled 2020-11-07: qty 25, 14d supply, fill #0

## 2020-11-07 MED ORDER — AZITHROMYCIN 250 MG PO TABS
ORAL_TABLET | ORAL | 0 refills | Status: AC
  Filled 2020-11-07: qty 6, 5d supply, fill #0

## 2020-11-07 MED ORDER — AZELASTINE HCL 0.1 % NA SOLN
1.0000 | Freq: Two times a day (BID) | NASAL | 12 refills | Status: DC
  Filled 2020-11-07: qty 30, 50d supply, fill #0

## 2020-11-07 NOTE — Patient Instructions (Signed)
° ° ° °  If you have lab work done today you will be contacted with your lab results within the next 2 weeks.  If you have not heard from us then please contact us. The fastest way to get your results is to register for My Chart. ° ° °IF you received an x-ray today, you will receive an invoice from [facility]. Please contact  Radiology at [phone] with questions or concerns regarding your invoice.  ° °IF you received labwork today, you will receive an invoice from [facility]. Please contact [facility] at [phone] with questions or concerns regarding your invoice.  ° °Our billing staff will not be able to assist you with questions regarding bills from these companies. ° °You will be contacted with the lab results as soon as they are available. The fastest way to get your results is to activate your My Chart account. Instructions are located on the last page of this paperwork. If you have not heard from us regarding the results in 2 weeks, please contact this office. °  ° ° ° °

## 2020-11-07 NOTE — Progress Notes (Signed)
Telemedicine Encounter- SOAP NOTE Established Patient  This telephone encounter was conducted with the patient's (or proxy's) verbal consent via audio telecommunications: yes/no: Yes Patient was instructed to have this encounter in a suitably private space; and to only have persons present to whom they give permission to participate. In addition, patient identity was confirmed by use of name plus two identifiers (DOB and address).  I discussed the limitations, risks, security and privacy concerns of performing an evaluation and management service by telephone and the availability of in person appointments. I also discussed with the patient that there may be a patient responsible charge related to this service. The patient expressed understanding and agreed to proceed.  I spent a total of 14 talking with the patient or their proxy.  Patient at home Provider in office  Participants: Kathrin Ruddy, NP and Lissa Merlin  Chief Complaint  Patient presents with   Nasal Congestion    Patient states she has been experiencing a cough, congestion and runny nose for about 2-3 days. Patient states it stated as a sore throat and went to health at work and tested negative for covid on Friday. Patient states she has been taking Mucinex with no relief    Subjective   Elizabeth Best is a 65 y.o. established patient. Telephone visit today for nasal congestion  HPI Symptoms started last Wednesday - Thursday had fever, cough, congestion in nose and chest Coughing a lot, thick yellow mucus coming up No lightheadedness, fever, chills, or nvd. Negative covid test at [facility] three days ago Symptoms stable, no improvement Taking mucinex with no relief.  Otherwise notes she needs refill on benzaclin Has worked well in past with intermittent for breakouts on face d/t mask wearing.   Patient Active Problem List   Diagnosis Date Noted   Iron deficiency anemia 08/07/2020   Vertigo 07/09/2019   Vitamin D  deficiency 07/09/2019   Osteoporosis without current pathological fracture 06/03/2019   PLMD (periodic limb movement disorder) 03/29/2018   Dyslipidemia 03/29/2018   Low self-esteem 03/29/2018   Disorder of pigmentation 03/29/2018   Seasonal allergic rhinitis due to pollen 10/31/2017   Hot flashes 10/31/2017   Pure hypercholesterolemia 10/31/2017   Chronic bilateral low back pain without sciatica 10/31/2017   Essential hypertension 10/31/2017   History of substance abuse ([place]) 07/17/2014   Obesity (BMI 30.0-34.9) 07/17/2014   Depression 07/17/2014   Insomnia 07/17/2014    Past Medical History:  Diagnosis Date   COPD (chronic obstructive pulmonary disease) ([place])    Depression    Hypertension    Osteopenia    Substance abuse ([place])    History of crack cocaine abuse.    Current Outpatient Medications  Medication Sig Dispense Refill   atorvastatin (LIPITOR) 20 MG tablet Take 1 tablet (20 mg total) by mouth daily. 90 tablet 1   albuterol (VENTOLIN HFA) 108 (90 Base) MCG/ACT inhaler INHALE 2 PUFFS INTO THE LUNGS EVERY 6 HOURS AS NEEDED FOR WHEEZING OR SHORTNESS OF BREATH (Patient not taking: No sig reported) 18 g 6   aspirin EC 81 MG tablet Take 81 mg by mouth daily. Swallow whole. (Patient not taking: No sig reported)     doxycycline (VIBRAMYCIN) 100 MG capsule Take 1 capsule by mouth 2 times a day for 14 days (Patient not taking: No sig reported) 28 capsule 0   methocarbamol (ROBAXIN) 500 MG tablet Take 1 tablet (500 mg total) by mouth 4 (four) times daily. (Patient not taking: Reported on 11/07/2020) 60 tablet  0   omeprazole (PRILOSEC) 40 MG capsule Take 1 capsule by mouth 2 times a day for 14 days (Patient not taking: No sig reported) 28 capsule 0   No current facility-administered medications for this visit.    No Known Allergies  Social History   Socioeconomic History   Marital status: Single    Spouse name: Not on file   Number of children: 1   Years of education: BA    Highest education level: Not on file  Occupational History   Occupation: custodian    Employer: [employer]  Tobacco Use   Smoking status: Every Day    Packs/day: 0.50    Types: Cigarettes   Smokeless tobacco: Never  Vaping Use   Vaping Use: Never used  Substance and Sexual Activity   Alcohol use: No   Drug use: No    Comment: history of crack cocaine abuse   Sexual activity: Not Currently    Partners: Female    Comment: Same sex partners  Other Topics Concern   Not on file  Social History Narrative   Marital status:  Single; not dating in years; same sexual partners      Children:  1 son (73); 6 grandchildren.  Son in [place]; grandchildren in [place].      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      Tobacco:  1/2 ppd x 30 years; never quit.  Quit once for one month with patch.       Alcohol:  None      Drugs: none currently; history of crack cocaine abuse.      Exercise: rides bike, treadmill.  Going to gym once weekly in 2018.   Drinks 2 caffeine drinks a day    Social Determinants of Radio broadcast assistant Strain: Not on file  Food Insecurity: Not on file  Transportation Needs: Not on file  Physical Activity: Not on file  Stress: Not on file  Social Connections: Not on file  Intimate Partner Violence: Not on file    Review of Systems  Constitutional:  Positive for malaise/fatigue. Negative for chills, diaphoresis, fever and weight loss.  HENT:  Positive for congestion and sinus pain. Negative for ear discharge, ear pain, hearing loss, nosebleeds, sore throat and tinnitus.   Eyes: Negative.   Respiratory:  Positive for cough and sputum production. Negative for hemoptysis, shortness of breath, wheezing and stridor.   Cardiovascular: Negative.   Gastrointestinal: Negative.   Genitourinary: Negative.   Musculoskeletal: Negative.   Skin: Negative.    Neurological: Negative.   Endo/Heme/Allergies: Negative.   Psychiatric/Behavioral: Negative.    All other systems reviewed and are negative.  Objective   Vitals as reported by the patient: There were no vitals filed for this visit.  There are no diagnoses linked to this encounter.  PLAN Lower respiratory infection of presumed bacterial origin, perhaps COPD exacerbation. Tx with z pack and prednisone taper as above. Refill albuterol for PRN use for shob / wheezing. Astelin for symptom relief. Ok to continue mucinex Work note written excusing absences on Thursday and Sunday, ok to take today and tomorrow off as well if needed Patient encouraged to call clinic with any questions, comments, or concerns.  I discussed the assessment and treatment plan with the patient. The patient was provided an opportunity to ask questions and all were answered.  The patient agreed with the plan and demonstrated an understanding of the instructions.   The patient was advised to call back or seek an in-person evaluation if the symptoms worsen or if the condition fails to improve as anticipated.  I provided 14 minutes of non-face-to-face time during this encounter.  Maximiano Coss, NP

## 2020-11-20 ENCOUNTER — Telehealth: Payer: Self-pay | Admitting: Registered Nurse

## 2020-11-20 ENCOUNTER — Encounter: Payer: Self-pay | Admitting: Registered Nurse

## 2020-11-20 NOTE — Telephone Encounter (Signed)
Can write - can send via MyChart but would call pt to confirm as unsure of she has access.  Thanks,  Denice Paradise

## 2020-11-20 NOTE — Telephone Encounter (Signed)
Called patient, she states you can send it via her mychart because she has to turn it in tomorrow with the dates specified.

## 2020-11-20 NOTE — Telephone Encounter (Signed)
Patient needs a note for 7/14 and 7/16 - Please advise.

## 2020-11-20 NOTE — Telephone Encounter (Signed)
Sent ? ?Thanks, ? ?Rich

## 2020-11-21 ENCOUNTER — Ambulatory Visit: Payer: No Typology Code available for payment source | Admitting: Registered Nurse

## 2020-11-21 NOTE — Telephone Encounter (Signed)
Pt scheduled  

## 2020-11-23 ENCOUNTER — Encounter (HOSPITAL_COMMUNITY): Payer: Self-pay

## 2020-11-23 ENCOUNTER — Other Ambulatory Visit (HOSPITAL_COMMUNITY)
Admission: RE | Admit: 2020-11-23 | Payer: No Typology Code available for payment source | Source: Ambulatory Visit | Admitting: Family Medicine

## 2020-11-23 ENCOUNTER — Other Ambulatory Visit: Payer: Self-pay

## 2020-11-23 ENCOUNTER — Ambulatory Visit (HOSPITAL_COMMUNITY)
Admission: EM | Admit: 2020-11-23 | Discharge: 2020-11-23 | Disposition: A | Discharge status: Home / Self Care | Payer: No Typology Code available for payment source | Attending: Family Medicine | Admitting: Family Medicine

## 2020-11-23 ENCOUNTER — Ambulatory Visit (INDEPENDENT_AMBULATORY_CARE_PROVIDER_SITE_OTHER): Payer: No Typology Code available for payment source | Admitting: Family Medicine

## 2020-11-23 ENCOUNTER — Encounter: Payer: Self-pay | Admitting: Family Medicine

## 2020-11-23 VITALS — BP 118/55 | HR 79 | Temp 98.1°F | Resp 18 | Ht 63.0 in | Wt 184.4 lb

## 2020-11-23 DIAGNOSIS — L989 Disorder of the skin and subcutaneous tissue, unspecified: Secondary | ICD-10-CM

## 2020-11-23 DIAGNOSIS — L980 Pyogenic granuloma: Secondary | ICD-10-CM

## 2020-11-23 DIAGNOSIS — M79645 Pain in left finger(s): Secondary | ICD-10-CM | POA: Diagnosis not present

## 2020-11-23 MED ORDER — LIDOCAINE HCL 2 % IJ SOLN
INTRAMUSCULAR | Status: AC
  Filled 2020-11-23: qty 20

## 2020-11-23 NOTE — Discharge Instructions (Addendum)
I have sent the skin mass removed to pathology. We will call you with any significant abnormalities or if there is need to begin or change treatment or pursue further follow up.
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
## 2020-11-23 NOTE — Patient Instructions (Addendum)
  As we discussed the area on your thumb could be an irritated seborrheic keratosis or skin lesion, or possible granuloma.  Based on the location and possible procedure needed, that would be best done through urgent care or possibly dermatology.  I will call you once I have reached out to urgent care to see if that is something they can help you with tonight.  I do recommend keeping that area covered, and can use a corn or callus pad around it if needed temporarily.  Tylenol if needed for pain.   If you have lab work done today you will be contacted with your lab results within the next 2 weeks.  If you have not heard from [place] then please contact us. The fastest way to get your results is to register for My Chart.   IF you received an x-ray today, you will receive an invoice from [facility]. Please contact [facility] at [phone] with questions or concerns regarding your invoice.   IF you received labwork today, you will receive an invoice from [place]. Please contact [facility] at [phone] with questions or concerns regarding your invoice.   Our billing staff will not be able to assist you with questions regarding bills from these companies.  You will be contacted with the lab results as soon as they are available. The fastest way to get your results is to activate your My Chart account. Instructions are located on the last page of this paperwork. If you have not heard from [place] regarding the results in 2 weeks, please contact this office.

## 2020-11-23 NOTE — ED Triage Notes (Signed)
Pt presents with a lesion on the left thumb x 3 days. States she would like to have it removed.
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
## 2020-11-29 LAB — SURGICAL PATHOLOGY

## 2020-12-05 ENCOUNTER — Other Ambulatory Visit (INDEPENDENT_AMBULATORY_CARE_PROVIDER_SITE_OTHER): Payer: No Typology Code available for payment source

## 2020-12-05 ENCOUNTER — Encounter: Payer: Self-pay | Admitting: Registered Nurse

## 2020-12-05 ENCOUNTER — Ambulatory Visit (INDEPENDENT_AMBULATORY_CARE_PROVIDER_SITE_OTHER): Payer: No Typology Code available for payment source | Admitting: Registered Nurse

## 2020-12-05 ENCOUNTER — Other Ambulatory Visit: Payer: Self-pay

## 2020-12-05 ENCOUNTER — Other Ambulatory Visit (HOSPITAL_COMMUNITY): Payer: Self-pay

## 2020-12-05 VITALS — BP 135/73 | HR 62 | Temp 97.8°F | Resp 17 | Ht 63.0 in | Wt 188.2 lb

## 2020-12-05 DIAGNOSIS — E559 Vitamin D deficiency, unspecified: Secondary | ICD-10-CM

## 2020-12-05 DIAGNOSIS — E611 Iron deficiency: Secondary | ICD-10-CM

## 2020-12-05 DIAGNOSIS — Z8739 Personal history of other diseases of the musculoskeletal system and connective tissue: Secondary | ICD-10-CM

## 2020-12-05 DIAGNOSIS — M5432 Sciatica, left side: Secondary | ICD-10-CM

## 2020-12-05 LAB — CBC WITH DIFFERENTIAL/PLATELET
Basophils Absolute: 0.1 10*3/uL (ref 0.0–0.1)
Basophils Relative: 0.9 % (ref 0.0–3.0)
Eosinophils Absolute: 0.2 10*3/uL (ref 0.0–0.7)
Eosinophils Relative: 2.2 % (ref 0.0–5.0)
HCT: 44.3 % (ref 36.0–46.0)
Hemoglobin: 14.2 g/dL (ref 12.0–15.0)
Lymphocytes Relative: 23.3 % (ref 12.0–46.0)
Lymphs Abs: 1.8 10*3/uL (ref 0.7–4.0)
MCHC: 32.1 g/dL (ref 30.0–36.0)
MCV: 75 fl — ABNORMAL LOW (ref 78.0–100.0)
Monocytes Absolute: 0.5 10*3/uL (ref 0.1–1.0)
Monocytes Relative: 5.7 % (ref 3.0–12.0)
Neutro Abs: 5.3 10*3/uL (ref 1.4–7.7)
Neutrophils Relative %: 67.9 % (ref 43.0–77.0)
Platelets: 389 10*3/uL (ref 150.0–400.0)
RBC: 5.91 Mil/uL — ABNORMAL HIGH (ref 3.87–5.11)
RDW: 24 % — ABNORMAL HIGH (ref 11.5–15.5)
WBC: 7.9 10*3/uL (ref 4.0–10.5)

## 2020-12-05 LAB — IBC + FERRITIN
Ferritin: 62.3 ng/mL (ref 10.0–291.0)
Iron: 71 ug/dL (ref 42–145)
Saturation Ratios: 15.1 % — ABNORMAL LOW (ref 20.0–50.0)
TIBC: 470.4 ug/dL — ABNORMAL HIGH (ref 250.0–450.0)
Transferrin: 336 mg/dL (ref 212.0–360.0)

## 2020-12-05 LAB — VITAMIN D 25 HYDROXY (VIT D DEFICIENCY, FRACTURES): VITD: 29.23 ng/mL — ABNORMAL LOW (ref 30.00–100.00)

## 2020-12-05 MED ORDER — METHOCARBAMOL 500 MG PO TABS
500.0000 mg | ORAL_TABLET | Freq: Four times a day (QID) | ORAL | 0 refills | Status: DC
  Filled 2020-12-05: qty 60, 15d supply, fill #0

## 2020-12-05 MED ORDER — GABAPENTIN 100 MG PO CAPS
100.0000 mg | ORAL_CAPSULE | Freq: Three times a day (TID) | ORAL | 3 refills | Status: DC
Start: 2020-12-05 — End: 2021-08-24
  Filled 2020-12-05: qty 90, 30d supply, fill #0
  Filled 2021-02-27: qty 90, 30d supply, fill #1

## 2020-12-05 MED ORDER — PREDNISONE 10 MG (21) PO TBPK
ORAL_TABLET | ORAL | 0 refills | Status: DC
Start: 2020-12-05 — End: 2021-04-27
  Filled 2020-12-05: qty 21, 6d supply, fill #0

## 2020-12-05 NOTE — Patient Instructions (Addendum)
Ms Elizabeth Best to see you. It's been an honor to work with you. I think you're in great hands with Ms. Elizabeth Best.  Labs today should be back tonight. I'll call with any urgent concerns.  Rechecking blood counts and vitamin D today  Ordering DEXA scan for bone density - you will get a call to schedule this   Thank you  Rich     If you have lab work done today you will be contacted with your lab results within the next 2 weeks.  If you have not heard from [place] then please contact us. The fastest way to get your results is to register for My Chart.   IF you received an x-ray today, you will receive an invoice from [facility]. Please contact [facility] at [phone] with questions or concerns regarding your invoice.   IF you received labwork today, you will receive an invoice from [facility]. Please contact [facility] at [phone] with questions or concerns regarding your invoice.   Our billing staff will not be able to assist you with questions regarding bills from these companies.  You will be contacted with the lab results as soon as they are available. The fastest way to get your results is to activate your My Chart account. Instructions are located on the last page of this paperwork. If you have not heard from [place] regarding the results in 2 weeks, please contact this office.
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
## 2020-12-18 ENCOUNTER — Telehealth: Payer: Self-pay

## 2020-12-18 DIAGNOSIS — Z23 Encounter for immunization: Secondary | ICD-10-CM

## 2020-12-18 NOTE — Telephone Encounter (Signed)
Patient is on vacation and wanted to know if she could come in to get both the shingles vaccine and flu shot on at the same time. Patient has never had a shingles vaccine. Wants to get done ASAP.

## 2020-12-18 NOTE — Telephone Encounter (Signed)
Pt is wanting to know when she needs to get her shingle shot   Pt call back [phone]

## 2020-12-19 ENCOUNTER — Ambulatory Visit: Payer: No Typology Code available for payment source

## 2020-12-19 NOTE — Telephone Encounter (Signed)
Nurse visit at any time - orders placed. Thanks.

## 2020-12-19 NOTE — Telephone Encounter (Signed)
Patient scheduled for shingles and flu shot.

## 2020-12-20 ENCOUNTER — Ambulatory Visit (INDEPENDENT_AMBULATORY_CARE_PROVIDER_SITE_OTHER): Payer: No Typology Code available for payment source | Admitting: Family Medicine

## 2020-12-20 ENCOUNTER — Other Ambulatory Visit: Payer: Self-pay

## 2020-12-20 DIAGNOSIS — Z23 Encounter for immunization: Secondary | ICD-10-CM | POA: Diagnosis not present

## 2020-12-21 ENCOUNTER — Other Ambulatory Visit: Payer: Self-pay

## 2020-12-21 DIAGNOSIS — D509 Iron deficiency anemia, unspecified: Secondary | ICD-10-CM

## 2020-12-22 ENCOUNTER — Inpatient Hospital Stay: Payer: No Typology Code available for payment source | Attending: Physician Assistant | Admitting: Hematology

## 2020-12-22 ENCOUNTER — Inpatient Hospital Stay: Payer: No Typology Code available for payment source

## 2020-12-22 ENCOUNTER — Other Ambulatory Visit: Payer: Self-pay

## 2020-12-22 VITALS — BP 148/64 | HR 74 | Temp 98.9°F | Resp 20 | Wt 193.7 lb

## 2020-12-22 DIAGNOSIS — D509 Iron deficiency anemia, unspecified: Secondary | ICD-10-CM

## 2020-12-22 DIAGNOSIS — D75839 Thrombocytosis, unspecified: Secondary | ICD-10-CM | POA: Diagnosis not present

## 2020-12-22 DIAGNOSIS — D500 Iron deficiency anemia secondary to blood loss (chronic): Secondary | ICD-10-CM | POA: Diagnosis not present

## 2020-12-22 DIAGNOSIS — F17210 Nicotine dependence, cigarettes, uncomplicated: Secondary | ICD-10-CM | POA: Diagnosis not present

## 2020-12-22 DIAGNOSIS — K922 Gastrointestinal hemorrhage, unspecified: Secondary | ICD-10-CM | POA: Diagnosis present

## 2020-12-22 LAB — CMP (CANCER CENTER ONLY)
ALT: 29 U/L (ref 0–44)
AST: 14 U/L — ABNORMAL LOW (ref 15–41)
Albumin: 4.3 g/dL (ref 3.5–5.0)
Alkaline Phosphatase: 130 U/L — ABNORMAL HIGH (ref 38–126)
Anion gap: 10 (ref 5–15)
BUN: 10 mg/dL (ref 8–23)
CO2: 26 mmol/L (ref 22–32)
Calcium: 10.8 mg/dL — ABNORMAL HIGH (ref 8.9–10.3)
Chloride: 107 mmol/L (ref 98–111)
Creatinine: 0.79 mg/dL (ref 0.44–1.00)
GFR, Estimated: >60 mL/min (ref >60)
Glucose, Bld: 95 mg/dL (ref 70–99)
Potassium: 4.2 mmol/L (ref 3.5–5.1)
Sodium: 143 mmol/L (ref 135–145)
Total Bilirubin: 0.2 mg/dL — ABNORMAL LOW (ref 0.3–1.2)
Total Protein: 7.9 g/dL (ref 6.5–8.1)

## 2020-12-22 LAB — CBC WITH DIFFERENTIAL (CANCER CENTER ONLY)
Abs Immature Granulocytes: 0.06 10*3/uL (ref 0.00–0.07)
Basophils Absolute: 0 10*3/uL (ref 0.0–0.1)
Basophils Relative: 0 %
Eosinophils Absolute: 0 10*3/uL (ref 0.0–0.5)
Eosinophils Relative: 0 %
HCT: 47 % — ABNORMAL HIGH (ref 36.0–46.0)
Hemoglobin: 14.4 g/dL (ref 12.0–15.0)
Immature Granulocytes: 1 %
Lymphocytes Relative: 12 %
Lymphs Abs: 1.4 10*3/uL (ref 0.7–4.0)
MCH: 23.8 pg — ABNORMAL LOW (ref 26.0–34.0)
MCHC: 30.6 g/dL (ref 30.0–36.0)
MCV: 77.7 fL — ABNORMAL LOW (ref 80.0–100.0)
Monocytes Absolute: 0.3 10*3/uL (ref 0.1–1.0)
Monocytes Relative: 2 %
Neutro Abs: 9.7 10*3/uL — ABNORMAL HIGH (ref 1.7–7.7)
Neutrophils Relative %: 85 %
Platelet Count: 383 10*3/uL (ref 150–400)
RBC: 6.05 MIL/uL — ABNORMAL HIGH (ref 3.87–5.11)
RDW: 22.4 % — ABNORMAL HIGH (ref 11.5–15.5)
WBC Count: 11.5 10*3/uL — ABNORMAL HIGH (ref 4.0–10.5)
nRBC: 0 % (ref 0.0–0.2)

## 2020-12-22 LAB — IRON AND TIBC
Iron: 78 ug/dL (ref 41–142)
Saturation Ratios: 19 % — ABNORMAL LOW (ref 21–57)
TIBC: 399 ug/dL (ref 236–444)
UIBC: 321 ug/dL (ref 120–384)

## 2020-12-22 LAB — VITAMIN B12: Vitamin B-12: 476 pg/mL (ref 180–914)

## 2020-12-22 LAB — FERRITIN: Ferritin: 51 ng/mL (ref 11–307)

## 2020-12-27 ENCOUNTER — Telehealth: Payer: Self-pay | Admitting: Hematology

## 2020-12-27 NOTE — Telephone Encounter (Signed)
Scheduled follow-up appointments per 9/2 los. Patient is aware.

## 2020-12-28 ENCOUNTER — Encounter: Payer: Self-pay | Admitting: Hematology
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
## 2021-01-02 ENCOUNTER — Inpatient Hospital Stay: Payer: No Typology Code available for payment source

## 2021-01-02 ENCOUNTER — Other Ambulatory Visit: Payer: Self-pay

## 2021-01-02 VITALS — BP 140/77 | HR 67 | Temp 97.8°F | Resp 18

## 2021-01-02 DIAGNOSIS — D509 Iron deficiency anemia, unspecified: Secondary | ICD-10-CM

## 2021-01-02 DIAGNOSIS — D500 Iron deficiency anemia secondary to blood loss (chronic): Secondary | ICD-10-CM | POA: Diagnosis not present

## 2021-01-02 MED ORDER — ACETAMINOPHEN 325 MG PO TABS
650.0000 mg | ORAL_TABLET | Freq: Once | ORAL | Status: AC
  Administered 2021-01-02: 650 mg via ORAL
  Filled 2021-01-02: qty 2

## 2021-01-02 MED ORDER — SODIUM CHLORIDE 0.9 % IV SOLN
300.0000 mg | Freq: Once | INTRAVENOUS | Status: AC
  Administered 2021-01-02: 300 mg via INTRAVENOUS
  Filled 2021-01-02: qty 10

## 2021-01-02 MED ORDER — LORATADINE 10 MG PO TABS
10.0000 mg | ORAL_TABLET | Freq: Once | ORAL | Status: AC
  Administered 2021-01-02: 10 mg via ORAL
  Filled 2021-01-02: qty 1

## 2021-01-02 MED ORDER — SODIUM CHLORIDE 0.9 % IV SOLN: Freq: Once | INTRAVENOUS | Status: AC

## 2021-01-02 NOTE — Patient Instructions (Signed)

## 2021-01-02 NOTE — Progress Notes (Signed)
Patient declined post Venofer observation. Verbal education provided. 

## 2021-01-05 ENCOUNTER — Ambulatory Visit (INDEPENDENT_AMBULATORY_CARE_PROVIDER_SITE_OTHER): Payer: No Typology Code available for payment source | Admitting: Registered Nurse

## 2021-01-05 ENCOUNTER — Other Ambulatory Visit: Payer: Self-pay

## 2021-01-05 ENCOUNTER — Other Ambulatory Visit (HOSPITAL_COMMUNITY): Payer: Self-pay

## 2021-01-05 ENCOUNTER — Encounter: Payer: Self-pay | Admitting: Registered Nurse

## 2021-01-05 VITALS — BP 127/58 | HR 92 | Temp 98.3°F | Resp 18 | Ht 63.0 in | Wt 192.6 lb

## 2021-01-05 DIAGNOSIS — L02219 Cutaneous abscess of trunk, unspecified: Secondary | ICD-10-CM

## 2021-01-05 MED ORDER — SULFAMETHOXAZOLE-TRIMETHOPRIM 800-160 MG PO TABS
1.0000 | ORAL_TABLET | Freq: Two times a day (BID) | ORAL | 0 refills | Status: DC
  Filled 2021-01-05: qty 14, 7d supply, fill #0
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
## 2021-01-05 NOTE — Patient Instructions (Addendum)
Elizabeth Best to see you! Glad to get the chance to do so again  Bactrim by mouth twice daily for the next week - should reduce that knot. If not, I'll see you next Friday, we'll drain it.  For the wart - make a paste with crushed aspirin and apply 2-3 times daily. Just don't get it in your eyes! If progressing, can get it frozen off.   Let me know if you have concerns,  [name]     If you have lab work done today you will be contacted with your lab results within the next 2 weeks.  If you have not heard from [place] then please contact us. The fastest way to get your results is to register for My Chart.   IF you received an x-ray today, you will receive an invoice from [facility] Radiology. Please contact [facility] at [phone] with questions or concerns regarding your invoice.   IF you received labwork today, you will receive an invoice from [facility]. Please contact [facility] at [phone] with questions or concerns regarding your invoice.   Our billing staff will not be able to assist you with questions regarding bills from these companies.  You will be contacted with the lab results as soon as they are available. The fastest way to get your results is to activate your My Chart account. Instructions are located on the last page of this paperwork. If you have not heard from [place] regarding the results in 2 weeks, please contact this office.

## 2021-01-08 ENCOUNTER — Other Ambulatory Visit: Payer: Self-pay

## 2021-01-08 ENCOUNTER — Inpatient Hospital Stay: Payer: No Typology Code available for payment source

## 2021-01-08 VITALS — BP 154/71 | HR 72 | Temp 97.8°F | Resp 18

## 2021-01-08 DIAGNOSIS — D500 Iron deficiency anemia secondary to blood loss (chronic): Secondary | ICD-10-CM | POA: Diagnosis not present

## 2021-01-08 DIAGNOSIS — D509 Iron deficiency anemia, unspecified: Secondary | ICD-10-CM

## 2021-01-08 MED ORDER — SODIUM CHLORIDE 0.9 % IV SOLN
300.0000 mg | Freq: Once | INTRAVENOUS | Status: AC
  Administered 2021-01-08: 300 mg via INTRAVENOUS
  Filled 2021-01-08: qty 15

## 2021-01-08 MED ORDER — SODIUM CHLORIDE 0.9 % IV SOLN: Freq: Once | INTRAVENOUS | Status: AC

## 2021-01-08 MED ORDER — ACETAMINOPHEN 325 MG PO TABS
650.0000 mg | ORAL_TABLET | Freq: Once | ORAL | Status: AC
  Administered 2021-01-08: 650 mg via ORAL
  Filled 2021-01-08: qty 2

## 2021-01-08 MED ORDER — LORATADINE 10 MG PO TABS
10.0000 mg | ORAL_TABLET | Freq: Once | ORAL | Status: AC
  Administered 2021-01-08: 10 mg via ORAL
  Filled 2021-01-08: qty 1

## 2021-01-08 NOTE — Patient Instructions (Signed)

## 2021-01-08 NOTE — Progress Notes (Signed)
Patient declined to stay for 30 minute post infusion observation period.  VSS upon leaving unit.

## 2021-01-12 ENCOUNTER — Ambulatory Visit: Payer: No Typology Code available for payment source | Admitting: Registered Nurse

## 2021-01-15 ENCOUNTER — Ambulatory Visit: Payer: No Typology Code available for payment source | Admitting: Registered Nurse

## 2021-01-15 ENCOUNTER — Encounter: Payer: Self-pay | Admitting: Registered Nurse

## 2021-01-15 NOTE — Progress Notes (Signed)
Patient came into our clinic today stating she had an appointment with Maximiano Coss, NP. We advised her that the appointment had been cancelled when she cancelled her appt on 01/22/2021 for [facility] with [facility]. She denies cancelling this appointment. She stated that she still had the abscess and that it was causing her discomfort. She states that she could not easily get off of work and needed to be seen.   Richard Morrow's schedule was full, however, I reached out to him for guidance considering patient was expressing pain. He asked that I direct her to urgent care since abscess had not improved and it was causing her discomfort. I relayed this message to the patient. She did not verbalize agreement to going to urgent care. She stated it was "alright" and walked out.

## 2021-01-18 ENCOUNTER — Other Ambulatory Visit (HOSPITAL_COMMUNITY): Payer: Self-pay

## 2021-01-18 ENCOUNTER — Other Ambulatory Visit: Payer: Self-pay

## 2021-01-18 ENCOUNTER — Encounter (HOSPITAL_COMMUNITY): Payer: Self-pay | Admitting: Emergency Medicine

## 2021-01-18 ENCOUNTER — Ambulatory Visit (HOSPITAL_COMMUNITY)
Admission: EM | Admit: 2021-01-18 | Discharge: 2021-01-18 | Disposition: A | Discharge status: Home / Self Care | Payer: No Typology Code available for payment source | Attending: Physician Assistant | Admitting: Physician Assistant

## 2021-01-18 DIAGNOSIS — R112 Nausea with vomiting, unspecified: Secondary | ICD-10-CM

## 2021-01-18 DIAGNOSIS — R197 Diarrhea, unspecified: Secondary | ICD-10-CM

## 2021-01-18 DIAGNOSIS — L02213 Cutaneous abscess of chest wall: Secondary | ICD-10-CM | POA: Diagnosis not present

## 2021-01-18 MED ORDER — ONDANSETRON 4 MG PO TBDP
4.0000 mg | ORAL_TABLET | Freq: Once | ORAL | Status: AC
  Administered 2021-01-18: 4 mg via ORAL

## 2021-01-18 MED ORDER — ONDANSETRON 4 MG PO TBDP
ORAL_TABLET | ORAL | Status: AC
  Filled 2021-01-18: qty 1

## 2021-01-18 MED ORDER — LIDOCAINE-EPINEPHRINE 1 %-1:100000 IJ SOLN
INTRAMUSCULAR | Status: AC
  Filled 2021-01-18: qty 1

## 2021-01-18 MED ORDER — ONDANSETRON 4 MG PO TBDP
4.0000 mg | ORAL_TABLET | Freq: Three times a day (TID) | ORAL | 0 refills | Status: DC | PRN
  Filled 2021-01-18: qty 20, 7d supply, fill #0

## 2021-01-18 MED ORDER — MUPIROCIN 2 % EX OINT
1.0000 "application " | TOPICAL_OINTMENT | Freq: Every day | CUTANEOUS | 0 refills | Status: DC
  Filled 2021-01-18: qty 22, 22d supply, fill #0
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
## 2021-01-18 NOTE — Discharge Instructions (Addendum)
I believe that you have a viral stomach bug.  Since you have had improved symptoms we are going to get lab work today.  Please use Zofran every 8 hours as needed for nausea/vomiting symptoms.  Make sure to eat a bland diet and drink plenty fluid.  If you have any worsening symptoms including recurrent nausea/vomiting/diarrhea or any abdominal pain please be seen immediately as we discussed.  Apply Bactroban to wound and allow this to heal.  Keep it clean with soap and water.  If you have any increased drainage, redness, increased pain you need to be seen immediately as we discussed.

## 2021-01-18 NOTE — ED Triage Notes (Signed)
Pt reports for little over a week had abscess on left breast. Saw PCP 9/19 and was prescribed antibiotics-reports has helped bring it down some but looks like has some drainage from it per patient. PCP advised to go to UC for possible drainage over area if not getting better. Pt adds having n/v/d yesterday and body aches.

## 2021-01-22 ENCOUNTER — Encounter: Payer: No Typology Code available for payment source | Admitting: Nurse Practitioner
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
## 2021-02-11 NOTE — Progress Notes (Signed)
This concern has been previously addressed by myself and/or another provider.  If they patient has ongoing concerns, they can contact me at their convenience.  Thank you,  Rich Melita Villalona, NP 

## 2021-02-27 ENCOUNTER — Other Ambulatory Visit (HOSPITAL_COMMUNITY): Payer: Self-pay

## 2021-02-28 ENCOUNTER — Ambulatory Visit: Payer: No Typology Code available for payment source

## 2021-03-01 ENCOUNTER — Other Ambulatory Visit (HOSPITAL_COMMUNITY): Payer: Self-pay

## 2021-03-01 ENCOUNTER — Other Ambulatory Visit: Payer: Self-pay | Admitting: Registered Nurse

## 2021-03-01 ENCOUNTER — Encounter: Payer: Self-pay | Admitting: Registered Nurse

## 2021-03-01 DIAGNOSIS — Z716 Tobacco abuse counseling: Secondary | ICD-10-CM

## 2021-03-01 MED ORDER — NICOTINE 7 MG/24HR TD PT24
7.0000 mg | MEDICATED_PATCH | Freq: Every day | TRANSDERMAL | 0 refills | Status: DC
  Filled 2021-03-01: qty 28, 28d supply, fill #0

## 2021-03-01 MED ORDER — NICOTINE 14 MG/24HR TD PT24
14.0000 mg | MEDICATED_PATCH | Freq: Every day | TRANSDERMAL | 0 refills | Status: DC
  Filled 2021-03-01 – 2021-03-29 (×2): qty 28, 28d supply, fill #0

## 2021-03-01 MED ORDER — NICOTINE 21 MG/24HR TD PT24
21.0000 mg | MEDICATED_PATCH | Freq: Every day | TRANSDERMAL | 0 refills | Status: DC
  Filled 2021-03-01: qty 28, 28d supply, fill #0

## 2021-03-05 ENCOUNTER — Ambulatory Visit (INDEPENDENT_AMBULATORY_CARE_PROVIDER_SITE_OTHER): Payer: No Typology Code available for payment source | Admitting: Registered Nurse

## 2021-03-05 ENCOUNTER — Ambulatory Visit: Payer: No Typology Code available for payment source

## 2021-03-05 DIAGNOSIS — Z23 Encounter for immunization: Secondary | ICD-10-CM | POA: Diagnosis not present

## 2021-03-05 NOTE — Progress Notes (Signed)
imm

## 2021-03-05 NOTE — Progress Notes (Signed)
Patient presents to the office for her second shingles vaccine. Patient received her vaccine in her left deltoid and tolerated well.

## 2021-03-06 ENCOUNTER — Encounter: Payer: Self-pay | Admitting: Registered Nurse

## 2021-03-06 ENCOUNTER — Other Ambulatory Visit (HOSPITAL_COMMUNITY): Payer: Self-pay

## 2021-03-06 DIAGNOSIS — E785 Hyperlipidemia, unspecified: Secondary | ICD-10-CM

## 2021-03-06 DIAGNOSIS — M5431 Sciatica, right side: Secondary | ICD-10-CM

## 2021-03-06 DIAGNOSIS — M5432 Sciatica, left side: Secondary | ICD-10-CM

## 2021-03-06 MED ORDER — TRAMADOL HCL 50 MG PO TABS
50.0000 mg | ORAL_TABLET | Freq: Three times a day (TID) | ORAL | 0 refills | Status: AC | PRN
  Filled 2021-03-06: qty 15, 5d supply, fill #0

## 2021-03-06 MED ORDER — COLESEVELAM HCL 625 MG PO TABS
625.0000 mg | ORAL_TABLET | Freq: Two times a day (BID) | ORAL | 2 refills | Status: DC
  Filled 2021-03-06: qty 60, 30d supply, fill #0
  Filled 2021-06-28: qty 60, 30d supply, fill #1
  Filled 2021-09-18: qty 60, 30d supply, fill #2

## 2021-03-06 NOTE — Telephone Encounter (Signed)
Pt is requesting refill tramadol for pain as well as welchol for cholesterol would you prefer in office visit first?

## 2021-03-19 ENCOUNTER — Other Ambulatory Visit: Payer: Self-pay | Admitting: Registered Nurse

## 2021-03-19 ENCOUNTER — Other Ambulatory Visit: Payer: Self-pay | Admitting: Family Medicine

## 2021-03-19 DIAGNOSIS — Z1231 Encounter for screening mammogram for malignant neoplasm of breast: Secondary | ICD-10-CM

## 2021-03-20 ENCOUNTER — Other Ambulatory Visit: Payer: Self-pay

## 2021-03-20 ENCOUNTER — Encounter: Payer: Self-pay | Admitting: Hematology

## 2021-03-20 ENCOUNTER — Ambulatory Visit
Admission: RE | Admit: 2021-03-20 | Discharge: 2021-03-20 | Disposition: A | Discharge status: Home / Self Care | Payer: No Typology Code available for payment source | Source: Ambulatory Visit | Attending: Registered Nurse | Admitting: Registered Nurse

## 2021-03-20 DIAGNOSIS — Z1231 Encounter for screening mammogram for malignant neoplasm of breast: Secondary | ICD-10-CM

## 2021-03-29 ENCOUNTER — Other Ambulatory Visit (HOSPITAL_COMMUNITY): Payer: Self-pay

## 2021-03-29 ENCOUNTER — Other Ambulatory Visit: Payer: Self-pay | Admitting: Registered Nurse

## 2021-03-29 DIAGNOSIS — E7800 Pure hypercholesterolemia, unspecified: Secondary | ICD-10-CM

## 2021-03-29 MED ORDER — ATORVASTATIN CALCIUM 20 MG PO TABS
20.0000 mg | ORAL_TABLET | Freq: Every day | ORAL | 1 refills | Status: DC
  Filled 2021-03-29 – 2021-06-28 (×3): qty 90, 90d supply, fill #0
  Filled 2021-10-11: qty 90, 90d supply, fill #1

## 2021-03-30 ENCOUNTER — Other Ambulatory Visit (HOSPITAL_COMMUNITY): Payer: Self-pay

## 2021-04-25 ENCOUNTER — Other Ambulatory Visit: Payer: Self-pay

## 2021-04-25 ENCOUNTER — Inpatient Hospital Stay: Payer: No Typology Code available for payment source | Attending: Hematology | Admitting: Hematology

## 2021-04-25 ENCOUNTER — Inpatient Hospital Stay: Payer: No Typology Code available for payment source

## 2021-04-25 VITALS — BP 138/69 | HR 71 | Temp 97.2°F | Resp 20 | Wt 196.6 lb

## 2021-04-25 DIAGNOSIS — D509 Iron deficiency anemia, unspecified: Secondary | ICD-10-CM

## 2021-04-25 DIAGNOSIS — M8580 Other specified disorders of bone density and structure, unspecified site: Secondary | ICD-10-CM | POA: Diagnosis not present

## 2021-04-25 DIAGNOSIS — K2950 Unspecified chronic gastritis without bleeding: Secondary | ICD-10-CM | POA: Insufficient documentation

## 2021-04-25 DIAGNOSIS — Z79899 Other long term (current) drug therapy: Secondary | ICD-10-CM | POA: Insufficient documentation

## 2021-04-25 DIAGNOSIS — F17210 Nicotine dependence, cigarettes, uncomplicated: Secondary | ICD-10-CM | POA: Insufficient documentation

## 2021-04-25 LAB — CBC WITH DIFFERENTIAL (CANCER CENTER ONLY)
Abs Immature Granulocytes: 0.02 10*3/uL (ref 0.00–0.07)
Basophils Absolute: 0 10*3/uL (ref 0.0–0.1)
Basophils Relative: 0 %
Eosinophils Absolute: 0.2 10*3/uL (ref 0.0–0.5)
Eosinophils Relative: 3 %
HCT: 44 % (ref 36.0–46.0)
Hemoglobin: 14.5 g/dL (ref 12.0–15.0)
Immature Granulocytes: 0 %
Lymphocytes Relative: 25 %
Lymphs Abs: 2.1 10*3/uL (ref 0.7–4.0)
MCH: 26.2 pg (ref 26.0–34.0)
MCHC: 33 g/dL (ref 30.0–36.0)
MCV: 79.6 fL — ABNORMAL LOW (ref 80.0–100.0)
Monocytes Absolute: 0.6 10*3/uL (ref 0.1–1.0)
Monocytes Relative: 7 %
Neutro Abs: 5.6 10*3/uL (ref 1.7–7.7)
Neutrophils Relative %: 65 %
Platelet Count: 339 10*3/uL (ref 150–400)
RBC: 5.53 MIL/uL — ABNORMAL HIGH (ref 3.87–5.11)
RDW: 15.7 % — ABNORMAL HIGH (ref 11.5–15.5)
WBC Count: 8.6 10*3/uL (ref 4.0–10.5)
nRBC: 0 % (ref 0.0–0.2)

## 2021-04-25 LAB — VITAMIN B12: Vitamin B-12: 472 pg/mL (ref 180–914)

## 2021-04-25 LAB — CMP (CANCER CENTER ONLY)
ALT: 24 U/L (ref 0–44)
AST: 17 U/L (ref 15–41)
Albumin: 4.1 g/dL (ref 3.5–5.0)
Alkaline Phosphatase: 105 U/L (ref 38–126)
Anion gap: 4 — ABNORMAL LOW (ref 5–15)
BUN: 8 mg/dL (ref 8–23)
CO2: 23 mmol/L (ref 22–32)
Calcium: 10.2 mg/dL (ref 8.9–10.3)
Chloride: 110 mmol/L (ref 98–111)
Creatinine: 0.57 mg/dL (ref 0.44–1.00)
GFR, Estimated: >60 mL/min (ref >60)
Glucose, Bld: 80 mg/dL (ref 70–99)
Potassium: 3.9 mmol/L (ref 3.5–5.1)
Sodium: 137 mmol/L (ref 135–145)
Total Bilirubin: 0.3 mg/dL (ref 0.3–1.2)
Total Protein: 7.3 g/dL (ref 6.5–8.1)

## 2021-04-25 LAB — IRON AND IRON BINDING CAPACITY (CC-WL,HP ONLY)
Iron: 55 ug/dL (ref 28–170)
Saturation Ratios: 15 % (ref 10.4–31.8)
TIBC: 363 ug/dL (ref 250–450)
UIBC: 308 ug/dL

## 2021-04-25 LAB — FERRITIN: Ferritin: 113 ng/mL (ref 11–307)

## 2021-04-27 ENCOUNTER — Other Ambulatory Visit: Payer: Self-pay

## 2021-04-27 ENCOUNTER — Other Ambulatory Visit (HOSPITAL_COMMUNITY): Payer: Self-pay

## 2021-04-27 ENCOUNTER — Encounter: Payer: Self-pay | Admitting: Nurse Practitioner

## 2021-04-27 ENCOUNTER — Ambulatory Visit (INDEPENDENT_AMBULATORY_CARE_PROVIDER_SITE_OTHER): Payer: No Typology Code available for payment source | Admitting: Nurse Practitioner

## 2021-04-27 VITALS — BP 140/82 | HR 72 | Temp 97.0°F | Ht 62.8 in | Wt 194.6 lb

## 2021-04-27 DIAGNOSIS — K5520 Angiodysplasia of colon without hemorrhage: Secondary | ICD-10-CM

## 2021-04-27 DIAGNOSIS — E785 Hyperlipidemia, unspecified: Secondary | ICD-10-CM | POA: Diagnosis not present

## 2021-04-27 DIAGNOSIS — F324 Major depressive disorder, single episode, in partial remission: Secondary | ICD-10-CM | POA: Diagnosis not present

## 2021-04-27 DIAGNOSIS — L811 Chloasma: Secondary | ICD-10-CM

## 2021-04-27 DIAGNOSIS — K2950 Unspecified chronic gastritis without bleeding: Secondary | ICD-10-CM

## 2021-04-27 DIAGNOSIS — D500 Iron deficiency anemia secondary to blood loss (chronic): Secondary | ICD-10-CM

## 2021-04-27 MED ORDER — OMEPRAZOLE 40 MG PO CPDR
40.0000 mg | DELAYED_RELEASE_CAPSULE | Freq: Every day | ORAL | 1 refills | Status: DC
  Filled 2021-04-27: qty 90, 90d supply, fill #0

## 2021-04-27 MED ORDER — HYDROXYZINE HCL 10 MG PO TABS
10.0000 mg | ORAL_TABLET | Freq: Three times a day (TID) | ORAL | 5 refills | Status: DC | PRN
  Filled 2021-04-27: qty 60, 20d supply, fill #0
  Filled 2021-06-28: qty 60, 20d supply, fill #1

## 2021-04-27 NOTE — Assessment & Plan Note (Signed)
Under the care of Dr. Irene Limbo (hematology). Ongoing iron infusion. Reviewed labs completed 04/24/2021: improced CBC, CMP and iron panel. She denies any melena or hematochezia. Last colonoscopy and upper endoscopy by Dr. Benson Norway 08/18/2020: chronic gastritis with H pylori, colon polyp and AVM. Benign polyp per pathology. Repeat colonoscopy in 87yrs. She states she is unable to schedule an appt with Dr. Lorie Apley office due to unpaid debt.  Entered ref to [facility] Continue f/up with Dr. Irene Limbo Continue PPI

## 2021-04-27 NOTE — Progress Notes (Signed)
Subjective:  Patient ID: Elizabeth Best, female    DOB: January 07, 1956  Age: 66 y.o. MRN: [id number]  CC: Establish Care (TOC/Pt in need of medication refills. )  HPI Transfer from [employer] [facility]. NP  Iron deficiency anemia Under the care of Dr. Irene Best (hematology). Ongoing iron infusion. Reviewed labs completed 04/24/2021: improced CBC, CMP and iron panel. She denies any melena or hematochezia. Last colonoscopy and upper endoscopy by Dr. Benson Best 08/18/2020: chronic gastritis with H pylori, colon polyp and AVM. Benign polyp per pathology. Repeat colonoscopy in 41yrs. She states she is unable to schedule an appt with Dr. Lorie Best office due to unpaid debt.  Entered ref to [facility] Continue f/up with Dr. Irene Best Continue PPI  GAD (generalized anxiety disorder) Stable mood Prn use of vistaril Refill sent  Melasma Bilateral cheeks Advised to use facial moisturizer with sunscreen  Chronic gastritis without bleeding Intermittent nausea, worse on empty stomach Reports she completed H.pylori treatment Last colonoscopy and upper endoscopy by Dr. Benson Best 08/18/2020: chronic gastritis with H pylori, colon polyp and AVM. Benign polyp per pathology. Repeat colonoscopy in 51yrs.  Continue PPI, refill sent Advised to avoid skipping meal Advised about need for tobacco cessation Repeat H.pylori stool Ref to GI  Depression screen [facility] 2/9 04/27/2021 01/05/2021 12/05/2020  Decreased Interest 1 0 0  Down, Depressed, Hopeless 1 0 0  PHQ - 2 Score 2 0 0  Altered sleeping 3 0 1  Tired, decreased energy 2 0 1  Change in appetite 1 0 0  Feeling bad or failure about yourself  0 0 0  Trouble concentrating 1 0 0  Moving slowly or fidgety/restless 0 0 0  Suicidal thoughts 0 0 0  PHQ-9 Score 9 0 2  Difficult doing work/chores Somewhat difficult Not difficult at all Not difficult at all  Some recent data might be hidden    GAD 7 : Generalized Anxiety Score 04/27/2021 10/16/2020 09/26/2020 06/03/2019  Nervous, Anxious, on  Edge 1 0 0 0  Control/stop worrying 1 0 0 0  Worry too much - different things 2 0 0 0  Trouble relaxing 2 0 0 0  Restless 1 0 0 0  Easily annoyed or irritable 1 0 - 0  Afraid - awful might happen 2 0 0 0  Total GAD 7 Score 10 0 - 0  Anxiety Difficulty Somewhat difficult Not difficult at all Not difficult at all Not difficult at all   Reviewed past Medical, Social and Family history today.  Outpatient Medications Prior to Visit  Medication Sig Dispense Refill   albuterol (VENTOLIN HFA) 108 (90 Base) MCG/ACT inhaler Inhale 2 puffs into the lungs every 6 (six) hours as needed for wheezing or shortness of breath. 18 g 6   atorvastatin (LIPITOR) 20 MG tablet Take 1 tablet (20 mg total) by mouth daily. 90 tablet 1   azelastine (ASTELIN) 0.1 % nasal spray Place 1 spray into both nostrils 2 (two) times daily. Use in each nostril as directed. 30 mL 12   colesevelam (WELCHOL) 625 MG tablet Take 1 tablet (625 mg total) by mouth 2 (two) times daily with a meal. 60 tablet 2   gabapentin (NEURONTIN) 100 MG capsule Take 1 capsule (100 mg total) by mouth 3 (three) times daily. 90 capsule 3   clindamycin-benzoyl peroxide (BENZACLIN) gel Apply 1 application topically 2 (two) times daily. 25 g 0   methocarbamol (ROBAXIN) 500 MG tablet Take 1 tablet (500 mg total) by mouth 4 (four) times daily. 60 tablet 0  mupirocin ointment (BACTROBAN) 2 % Apply 1 application topically daily. 22 g 0   nicotine (NICODERM CQ - DOSED IN MG/24 HOURS) 14 mg/24hr patch Place 1 patch (14 mg total) onto the skin daily. 28 patch 0   nicotine (NICODERM CQ - DOSED IN MG/24 HOURS) 21 mg/24hr patch Place 1 patch (21 mg total) onto the skin daily. 28 patch 0   nicotine (NICODERM CQ - DOSED IN MG/24 HR) 7 mg/24hr patch Place 1 patch (7 mg total) onto the skin daily. 28 patch 0   omeprazole (PRILOSEC) 40 MG capsule Take 1 capsule by mouth 2 times a day for 14 days 28 capsule 0   aspirin EC 81 MG tablet Take 81 mg by mouth daily. Swallow  whole. (Patient not taking: Reported on 09/26/2020)     ondansetron (ZOFRAN ODT) 4 MG disintegrating tablet Take 1 tablet (4 mg total) by mouth every 8 (eight) hours as needed for nausea or vomiting. (Patient not taking: Reported on 04/27/2021) 20 tablet 0   predniSONE (STERAPRED UNI-PAK 21 TAB) 10 MG (21) TBPK tablet Take per package instructions. Do not skip doses. Finish entire supply. (Patient not taking: Reported on 04/27/2021) 21 tablet 0   sulfamethoxazole-trimethoprim (BACTRIM DS) 800-160 MG tablet Take 1 tablet by mouth 2 (two) times daily. (Patient not taking: Reported on 04/27/2021) 14 tablet 0   No facility-administered medications prior to visit.    ROS See HPI  Objective:  BP 140/82 (BP Location: Left Arm, Patient Position: Sitting, Cuff Size: Large)    Pulse 72    Temp (!) 97 F (36.1 C) (Temporal)    Ht 5' 2.8" (1.595 m)    Wt 194 lb 9.6 oz (88.3 kg)    SpO2 98%    BMI 34.69 kg/m   Physical Exam HENT:     Head:      Comments: Hyperpigmentation on cheeks, no nodules, no pustules, no papules Cardiovascular:     Rate and Rhythm: Normal rate and regular rhythm.     Pulses: Normal pulses.     Heart sounds: Normal heart sounds.  Pulmonary:     Effort: Pulmonary effort is normal.     Breath sounds: Normal breath sounds.  Musculoskeletal:     Right lower leg: No edema.     Left lower leg: No edema.  Skin:    General: Skin is warm and dry.     Findings: Lesion present.  Neurological:     Mental Status: She is alert and oriented to person, place, and time.  Psychiatric:        Mood and Affect: Mood normal.        Behavior: Behavior normal.        Thought Content: Thought content normal.    Assessment & Plan:  This visit occurred during the SARS-CoV-2 public health emergency.  Safety protocols were in place, including screening questions prior to the visit, additional usage of staff PPE, and extensive cleaning of exam room while observing appropriate contact time as indicated  for disinfecting solutions.   Elizabeth Best was seen today for establish care.  Diagnoses and all orders for this visit:  Major depressive disorder with single episode, in partial remission (HCC) -     hydrOXYzine (ATARAX) 10 MG tablet; Take 1 tablet (10 mg total) by mouth every 8 (eight) hours as needed for anxiety.  Dyslipidemia -     Lipid panel; Future  Melasma  Serum calcium elevated -     TSH; Future -  PTH, Intact and Calcium; Future  Chronic gastritis without bleeding, unspecified gastritis type -     omeprazole (PRILOSEC) 40 MG capsule; Take 1 capsule (40 mg total) by mouth daily. -     Ambulatory referral to Gastroenterology -     Helicobacter pylori special antigen; Future  Iron deficiency anemia due to chronic blood loss -     omeprazole (PRILOSEC) 40 MG capsule; Take 1 capsule (40 mg total) by mouth daily. -     Ambulatory referral to Gastroenterology -     Fecal occult blood, imunochemical; Standing  AVM (arteriovenous malformation) of colon -     Ambulatory referral to Gastroenterology -     Fecal occult blood, imunochemical; Standing    Problem List Items Addressed This Visit       Digestive   Chronic gastritis without bleeding    Intermittent nausea, worse on empty stomach Reports she completed H.pylori treatment Last colonoscopy and upper endoscopy by Dr. Benson Best 08/18/2020: chronic gastritis with H pylori, colon polyp and AVM. Benign polyp per pathology. Repeat colonoscopy in 20yrs.  Continue PPI, refill sent Advised to avoid skipping meal Advised about need for tobacco cessation Repeat H.pylori stool Ref to GI      Relevant Medications   omeprazole (PRILOSEC) 40 MG capsule   Other Relevant Orders   Ambulatory referral to Gastroenterology   Helicobacter pylori special antigen     Musculoskeletal and Integument   Melasma    Bilateral cheeks Advised to use facial moisturizer with sunscreen        Other   Dyslipidemia   Relevant Orders    Lipid panel   GAD (generalized anxiety disorder) - Primary    Stable mood Prn use of vistaril Refill sent      Relevant Medications   hydrOXYzine (ATARAX) 10 MG tablet   Iron deficiency anemia    Under the care of Dr. Irene Best (hematology). Ongoing iron infusion. Reviewed labs completed 04/24/2021: improced CBC, CMP and iron panel. She denies any melena or hematochezia. Last colonoscopy and upper endoscopy by Dr. Benson Best 08/18/2020: chronic gastritis with H pylori, colon polyp and AVM. Benign polyp per pathology. Repeat colonoscopy in 69yrs. She states she is unable to schedule an appt with Dr. Lorie Best office due to unpaid debt.  Entered ref to [facility] Continue f/up with Dr. Irene Best Continue PPI      Relevant Medications   omeprazole (PRILOSEC) 40 MG capsule   Other Relevant Orders   Ambulatory referral to Gastroenterology   Fecal occult blood, imunochemical   Other Visit Diagnoses     Serum calcium elevated       Relevant Orders   TSH   PTH, Intact and Calcium   AVM (arteriovenous malformation) of colon       Relevant Orders   Ambulatory referral to Gastroenterology   Fecal occult blood, imunochemical       Follow-up: Return in about 6 months (around 10/25/2021) for CPE (fasting).  Wilfred Lacy, NP

## 2021-04-27 NOTE — Assessment & Plan Note (Signed)
Bilateral cheeks Advised to use facial moisturizer with sunscreen

## 2021-04-27 NOTE — Assessment & Plan Note (Signed)
Stable mood Prn use of vistaril Refill sent

## 2021-04-27 NOTE — Assessment & Plan Note (Addendum)
Intermittent nausea, worse on empty stomach Reports she completed H.pylori treatment No ETOH use Last colonoscopy and upper endoscopy by Dr. Benson Norway 08/18/2020: chronic gastritis with H pylori, colon polyp and AVM. Benign polyp per pathology. Repeat colonoscopy in 68yrs.  Continue PPI, refill sent Advised to avoid skipping meal Advised about need for tobacco cessation Repeat H.pylori stool Ref to GI

## 2021-04-27 NOTE — Patient Instructions (Addendum)
Sign medical release to get records from Dr. Collene Mares. Schedule lab appt. Need to be fasting 8hrs prior to blood draw. Ok to drink water, ok to take omeprazole. You will be contacted to schedule appt with [facility]. Get stool kit from lab to check stool for blood  Melasma Melasma is a skin condition that causes areas of darker coloring. It usually appears in patches on the cheeks, forehead, upper lip, and neck. These patches can look like a mask. The discolored areas do not itch and are not red or swollen. Melasma is not contagious. This means that it does not spread from person to person. What are the causes? The cause of this condition is not known. However, it can be started by certain triggers, such as: Being out in the sun. Allergies to medicines or cosmetics, such as makeup or face creams. Changes in your hormones. These may result from: Taking birth control medicines. Taking hormone replacement therapy. Being pregnant. What increases the risk? The following factors may make you more likely to develop this condition: Being a woman. Melasma is less common in men. Having a family history of melasma. Having darker skin. Living in a tropical climate. What are the signs or symptoms? The only sign of this condition is dark or tan patches on the skin. How is this diagnosed? This condition is diagnosed based on: A physical exam. Your health care provider will examine the physical appearance of your skin. He or she may use a special light, called a Wood lamp, to look more closely at your skin. Biopsy. A small sample of your skin is removed and looked at under a microscope. This is done to make sure your melasma is not caused by another skin condition, such as skin cancer. How is this treated? There is no cure for this condition. However, there are treatments that may lighten the color of the darker patches. Treatment may include: Medicines, such as bleaching or lightening creams. Facial  chemical peels. Laser treatment. Dermabrasion or microdermabrasion. These procedures use fine instruments to scrape and remove the outer layer of skin to help you grow new, healthy-looking skin. Your melasma may also go away on its own over time. Follow these instructions at home: Lifestyle Avoid too much exposure to the sun, especially in tropical areas. Wear sunscreen with an SPF of 30 or higher every day. Wear a hat that protects your face from the sun. Use gentle cosmetics that are meant for sensitive skin. Do not use wax to remove excess hair in areas where you have or have had melasma. General instructions Take or apply over-the-counter and prescription medicines only as told by your health care provider. Keep all follow-up visits. This is important. Contact a health care provider if: You have new symptoms. Your symptoms get worse. Your affected skin areas are bleeding or irritated. Summary Melasma is a skin condition that causes areas of darker coloring that do not itch and are not red or swollen. The cause of this condition is not known. However, it can be started by certain triggers, such as being out in the sun, allergies to medicines or cosmetics, or changes in your hormones. Risk factors include being a woman, having a family history of melasma, having darker skin, or living in a tropical climate. There is no cure for this condition. However, there are treatments that may lighten the color of the darker patches. Treatment may include medicines, facial chemical peels, laser treatment, dermabrasion, or microdermabrasion. This information is not intended  to replace advice given to you by your health care provider. Make sure you discuss any questions you have with your health care provider. Document Revised: 09/27/2019 Document Reviewed: 09/27/2019 Elsevier Patient Education  [employer].

## 2021-04-29 ENCOUNTER — Encounter: Payer: Self-pay | Admitting: Nurse Practitioner

## 2021-05-01 ENCOUNTER — Encounter: Payer: Self-pay | Admitting: Hematology
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
## 2021-05-03 ENCOUNTER — Other Ambulatory Visit: Payer: Self-pay

## 2021-05-03 DIAGNOSIS — H539 Unspecified visual disturbance: Secondary | ICD-10-CM

## 2021-05-05 MED ORDER — POLYSACCHARIDE IRON COMPLEX 150 MG PO CAPS
150.0000 mg | ORAL_CAPSULE | Freq: Every day | ORAL | 3 refills | Status: DC
  Filled 2021-05-05: qty 30, 30d supply, fill #0
  Filled 2021-06-13: qty 30, 30d supply, fill #1

## 2021-05-05 NOTE — Addendum Note (Signed)
Addended by: Sullivan Lone on: 05/05/2021 01:14 PM   Modules accepted: Orders

## 2021-05-07 ENCOUNTER — Other Ambulatory Visit (HOSPITAL_COMMUNITY): Payer: Self-pay

## 2021-05-07 ENCOUNTER — Encounter: Payer: Self-pay | Admitting: Hematology

## 2021-05-09 ENCOUNTER — Telehealth: Payer: Self-pay | Admitting: Nurse Practitioner

## 2021-05-09 NOTE — Telephone Encounter (Signed)
[facility] called saying Elizabeth Best would like to be referred somewhere else. Pt needs an appt after 3, and they do not have that avail.

## 2021-05-21 ENCOUNTER — Other Ambulatory Visit: Payer: No Typology Code available for payment source

## 2021-05-21 ENCOUNTER — Other Ambulatory Visit: Payer: Self-pay

## 2021-05-21 ENCOUNTER — Other Ambulatory Visit (INDEPENDENT_AMBULATORY_CARE_PROVIDER_SITE_OTHER): Payer: No Typology Code available for payment source

## 2021-05-21 DIAGNOSIS — K2950 Unspecified chronic gastritis without bleeding: Secondary | ICD-10-CM

## 2021-05-21 DIAGNOSIS — D500 Iron deficiency anemia secondary to blood loss (chronic): Secondary | ICD-10-CM

## 2021-05-21 DIAGNOSIS — E785 Hyperlipidemia, unspecified: Secondary | ICD-10-CM | POA: Diagnosis not present

## 2021-05-21 DIAGNOSIS — E349 Endocrine disorder, unspecified: Secondary | ICD-10-CM | POA: Diagnosis not present

## 2021-05-21 DIAGNOSIS — K5520 Angiodysplasia of colon without hemorrhage: Secondary | ICD-10-CM

## 2021-05-21 LAB — LIPID PANEL
Cholesterol: 150 mg/dL (ref 0–200)
HDL: 34.5 mg/dL — ABNORMAL LOW (ref >39.00)
LDL Cholesterol: 93 mg/dL (ref 0–99)
NonHDL: 115.58
Total CHOL/HDL Ratio: 4
Triglycerides: 113 mg/dL (ref 0.0–149.0)
VLDL: 22.6 mg/dL (ref 0.0–40.0)

## 2021-05-21 LAB — TSH: TSH: 0.66 u[IU]/mL (ref 0.35–5.50)

## 2021-05-21 NOTE — Progress Notes (Signed)
Per hte orders of Wilfred Lacy NP, pt is here for labs , pt tolerated draw well.

## 2021-05-22 ENCOUNTER — Telehealth: Payer: Self-pay

## 2021-05-22 LAB — HELICOBACTER PYLORI  SPECIAL ANTIGEN
MICRO NUMBER:: 12937192
SPECIMEN QUALITY: ADEQUATE

## 2021-05-22 LAB — FECAL OCCULT BLOOD, IMMUNOCHEMICAL: Fecal Occult Bld: POSITIVE — AB

## 2021-05-22 LAB — PTH, INTACT AND CALCIUM
Calcium: 10.9 mg/dL — ABNORMAL HIGH (ref 8.6–10.4)
PTH: 88 pg/mL — ABNORMAL HIGH (ref 16–77)

## 2021-05-22 NOTE — Telephone Encounter (Signed)
Positive IFOB

## 2021-05-25 NOTE — Addendum Note (Signed)
Addended by: Leana Gamer on: 05/25/2021 02:58 PM   Modules accepted: Orders

## 2021-05-30 ENCOUNTER — Other Ambulatory Visit: Payer: No Typology Code available for payment source

## 2021-06-13 ENCOUNTER — Encounter: Payer: Self-pay | Admitting: Hematology

## 2021-06-13 ENCOUNTER — Other Ambulatory Visit (HOSPITAL_COMMUNITY): Payer: Self-pay

## 2021-06-28 ENCOUNTER — Encounter: Payer: Self-pay | Admitting: Gastroenterology

## 2021-06-28 ENCOUNTER — Other Ambulatory Visit (HOSPITAL_COMMUNITY): Payer: Self-pay

## 2021-06-28 ENCOUNTER — Ambulatory Visit (INDEPENDENT_AMBULATORY_CARE_PROVIDER_SITE_OTHER): Payer: No Typology Code available for payment source | Admitting: Gastroenterology

## 2021-06-28 VITALS — BP 132/84 | HR 94 | Ht 63.0 in | Wt 197.5 lb

## 2021-06-28 DIAGNOSIS — D509 Iron deficiency anemia, unspecified: Secondary | ICD-10-CM

## 2021-06-28 DIAGNOSIS — A048 Other specified bacterial intestinal infections: Secondary | ICD-10-CM

## 2021-06-28 DIAGNOSIS — K259 Gastric ulcer, unspecified as acute or chronic, without hemorrhage or perforation: Secondary | ICD-10-CM

## 2021-06-28 DIAGNOSIS — K5520 Angiodysplasia of colon without hemorrhage: Secondary | ICD-10-CM
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
## 2021-06-28 NOTE — Patient Instructions (Signed)
If you are age 64 or older, your body mass index should be between 23-30. Your Body mass index is 34.99 kg/m?Marland Kitchen If this is out of the aforementioned range listed, please consider follow up with your Primary Care Provider. ? ?If you are age 44 or younger, your body mass index should be between 19-25. Your Body mass index is 34.99 kg/m?Marland Kitchen If this is out of the aformentioned range listed, please consider follow up with your Primary Care Provider.  ? ?You have been scheduled for an endoscopy. Please follow written instructions given to you at your visit today. ?If you use inhalers (even only as needed), please bring them with you on the day of your procedure. ? ?Stop Omeprazole. ? ?The [facility] providers would like to encourage you to use [facility] to communicate with providers for non-urgent requests or questions.  Due to long hold times on the telephone, sending your provider a message by [facility] may be a faster and more efficient way to get a response.  Please allow 48 business hours for a response.  Please remember that this is for non-urgent requests.  ? ?It was a pleasure to see you today! ? ?Thank you for trusting me with your gastrointestinal care!   ? ?Scott E.Candis Schatz, MD  ?

## 2021-07-06 ENCOUNTER — Ambulatory Visit: Payer: No Typology Code available for payment source | Admitting: Nurse Practitioner

## 2021-07-18 ENCOUNTER — Other Ambulatory Visit (HOSPITAL_COMMUNITY): Payer: Self-pay

## 2021-08-06 ENCOUNTER — Encounter: Payer: No Typology Code available for payment source | Admitting: Gastroenterology

## 2021-08-07 ENCOUNTER — Encounter: Payer: Self-pay | Admitting: Hematology

## 2021-08-16 ENCOUNTER — Ambulatory Visit (AMBULATORY_SURGERY_CENTER): Payer: No Typology Code available for payment source | Admitting: Gastroenterology

## 2021-08-16 ENCOUNTER — Encounter: Payer: Self-pay | Admitting: Gastroenterology

## 2021-08-16 VITALS — BP 148/77 | HR 67 | Temp 96.2°F | Resp 22 | Ht 63.0 in | Wt 197.0 lb

## 2021-08-16 DIAGNOSIS — K31819 Angiodysplasia of stomach and duodenum without bleeding: Secondary | ICD-10-CM

## 2021-08-16 DIAGNOSIS — D132 Benign neoplasm of duodenum: Secondary | ICD-10-CM | POA: Diagnosis not present

## 2021-08-16 DIAGNOSIS — K259 Gastric ulcer, unspecified as acute or chronic, without hemorrhage or perforation: Secondary | ICD-10-CM | POA: Diagnosis present

## 2021-08-16 DIAGNOSIS — D509 Iron deficiency anemia, unspecified: Secondary | ICD-10-CM | POA: Diagnosis not present

## 2021-08-16 DIAGNOSIS — K2950 Unspecified chronic gastritis without bleeding: Secondary | ICD-10-CM | POA: Diagnosis not present

## 2021-08-16 MED ORDER — SODIUM CHLORIDE 0.9 % IV SOLN: 500.0000 mL | Freq: Once | INTRAVENOUS | Status: DC

## 2021-08-16 NOTE — Progress Notes (Signed)
Called to room to assist during endoscopic procedure.  Patient ID and intended procedure confirmed with present staff. Received instructions for my participation in the procedure from the performing physician.  

## 2021-08-16 NOTE — Op Note (Signed)
[facility] ?Patient Name: Elizabeth Best ?Procedure Date: 08/16/2021 7:57 AM ?MRN: [id number] ?Endoscopist: Marcus Schwandt E. Candis Schatz , MD ?Age: 66 ?Referring MD:  ?Date of Birth: [birth year] ?Gender: Female ?Account #: [id number] ?Procedure:                Upper GI endoscopy ?Indications:              Previously treated for Helicobacter pylori,  ?                          Follow-up of gastric ulcer, Follow-up of intestinal  ?                          metaplasia ?Medicines:                Monitored Anesthesia Care ?Procedure:                Pre-Anesthesia Assessment: ?                          - Prior to the procedure, a History and Physical  ?                          was performed, and patient medications and  ?                          allergies were reviewed. The patient's tolerance of  ?                          previous anesthesia was also reviewed. The risks  ?                          and benefits of the procedure and the sedation  ?                          options and risks were discussed with the patient.  ?                          All questions were answered, and informed consent  ?                          was obtained. Prior Anticoagulants: The patient has  ?                          taken no previous anticoagulant or antiplatelet  ?                          agents. ASA Grade Assessment: II - A patient with  ?                          mild systemic disease. After reviewing the risks  ?                          and benefits, the patient was deemed in  ?  satisfactory condition to undergo the procedure. ?                          After obtaining informed consent, the endoscope was  ?                          passed under direct vision. Throughout the  ?                          procedure, the patient's blood pressure, pulse, and  ?                          oxygen saturations were monitored continuously. The  ?                          GIF [id number] #[id number] was introduced  through the  ?                          mouth, and advanced to the third part of duodenum.  ?                          The upper GI endoscopy was accomplished without  ?                          difficulty. The patient tolerated the procedure  ?                          well. ?Scope In: ?Scope Out: ?Findings:                 The examined portions of the nasopharynx,  ?                          oropharynx and larynx were normal. ?                          The examined esophagus was normal. ?                          Diffuse granular mucosa was found in the gastric  ?                          antrum. Biopsies were taken with a cold forceps for  ?                          Helicobacter pylori testing/assessment of gastric  ?                          intestinal metaplasia. Estimated blood loss was  ?                          minimal. ?                          The exam of the stomach was otherwise normal.  ?  Biopsies were taken with a cold forceps in the  ?                          gastric body for Helicobacter pylori  ?                          testing/assessment of gastric intestinal  ?                          metaplasia. Estimated blood loss was minimal. ?                          A single 2 mm angioectasia without bleeding was  ?                          found in the duodenal bulb. ?                          A single 4 mm sessile polyp with no bleeding was  ?                          found in the third portion of the duodenum. The  ?                          polyp was removed with a cold snare. Resection and  ?                          retrieval were complete. Estimated blood loss was  ?                          minimal. ?                          The exam of the duodenum was otherwise normal. ?Complications:            No immediate complications. ?Estimated Blood Loss:     Estimated blood loss was minimal. ?Impression:               - The examined portions of the nasopharynx,  ?                           oropharynx and larynx were normal. ?                          - Normal esophagus. ?                          - Granular gastric mucosa. Biopsied. ?                          - A single non-bleeding angioectasia in the  ?                          duodenum. ?                          -  A single duodenal polyp. Resected and retrieved. ?                          - Biopsies were taken with a cold forceps for  ?                          Helicobacter pylori testing. ?Recommendation:           - Patient has a contact number available for  ?                          emergencies. The signs and symptoms of potential  ?                          delayed complications were discussed with the  ?                          patient. Return to normal activities tomorrow.  ?                          Written discharge instructions were provided to the  ?                          patient. ?                          - Resume previous diet. ?                          - Continue present medications. ?                          - Await pathology results. ?                          - Repeat upper endoscopy (date not yet determined)  ?                          for surveillance based on pathology results. ?                          - Continue oral iron supplementation. ?                          - Continue to avoid NSAIDs ?Jeraline Marcinek E. Candis Schatz, MD ?08/16/2021 8:20:20 AM ?This report has been signed electronically. ?

## 2021-08-16 NOTE — Patient Instructions (Signed)
Thank you for letting us take care of your healthcare needs today. ?Continue to avoid NSAIDS. ?Continue oral Iron Supplements. ? ? ?YOU HAD AN ENDOSCOPIC PROCEDURE TODAY AT [facility]:   Refer to the procedure report that was given to you for any specific questions about what was found during the examination.  If the procedure report does not answer your questions, please call your gastroenterologist to clarify.  If you requested that your care partner not be given the details of your procedure findings, then the procedure report has been included in a sealed envelope for you to review at your convenience later. ? ?YOU SHOULD EXPECT: Some feelings of bloating in the abdomen. Passage of more gas than usual.  Walking can help get rid of the air that was put into your GI tract during the procedure and reduce the bloating. If you had a lower endoscopy (such as a colonoscopy or flexible sigmoidoscopy) you may notice spotting of blood in your stool or on the toilet paper. If you underwent a bowel prep for your procedure, you may not have a normal bowel movement for a few days. ? ?Please Note:  You might notice some irritation and congestion in your nose or some drainage.  This is from the oxygen used during your procedure.  There is no need for concern and it should clear up in a day or so. ? ?SYMPTOMS TO REPORT IMMEDIATELY: ? ? ?Following upper endoscopy (EGD) ? Vomiting of blood or coffee ground material ? New chest pain or pain under the shoulder blades ? Painful or persistently difficult swallowing ? New shortness of breath ? Fever of 100?F or higher ? Black, tarry-looking stools ? ?For urgent or emergent issues, a gastroenterologist can be reached at any hour by calling [phone]. ?Do not use MyChart messaging for urgent concerns.  ? ? ?DIET:  We do recommend a small meal at first, but then you may proceed to your regular diet.  Drink plenty of fluids but you should avoid alcoholic beverages  for 24 hours. ? ?ACTIVITY:  You should plan to take it easy for the rest of today and you should NOT DRIVE or use heavy machinery until tomorrow (because of the sedation medicines used during the test).   ? ?FOLLOW UP: ?Our staff will call the number listed on your records 48-72 hours following your procedure to check on you and address any questions or concerns that you may have regarding the information given to you following your procedure. If we do not reach you, we will leave a message.  We will attempt to reach you two times.  During this call, we will ask if you have developed any symptoms of COVID 19. If you develop any symptoms (ie: fever, flu-like symptoms, shortness of breath, cough etc.) before then, please call [phone].  If you test positive for Covid 19 in the 2 weeks post procedure, please call and report this information to [place].   ? ?If any biopsies were taken you will be contacted by phone or by letter within the next 1-3 weeks.  Please call us at [phone] if you have not heard about the biopsies in 3 weeks.  ? ? ?SIGNATURES/CONFIDENTIALITY: ?You and/or your care partner have signed paperwork which will be entered into your electronic medical record.  These signatures attest to the fact that that the information above on your After Visit Summary has been reviewed and is understood.  Full responsibility of the confidentiality of this  discharge information lies with you and/or your care-partner.  ?
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
## 2021-08-16 NOTE — Progress Notes (Signed)
Report to pacu RN, VSS. ?

## 2021-08-20 ENCOUNTER — Telehealth: Payer: Self-pay

## 2021-08-20 NOTE — Telephone Encounter (Signed)
?  Follow up Call- ? ? ?  08/16/2021  ?  7:15 AM  ?Call back number  ?Post procedure Call Back phone  # [phone]  ?Permission to leave phone message Yes  ?  ? ?Patient questions: ? ?Do you have a fever, pain , or abdominal swelling? No. ?Pain Score  0 * ? ?Have you tolerated food without any problems? Yes.   ? ?Have you been able to return to your normal activities? Yes.   ? ?Do you have any questions about your discharge instructions: ?Diet   No. ?Medications  No. ?Follow up visit  No. ? ?Do you have questions or concerns about your Care? No. ? ?Actions: ?* If pain score is 4 or above: ?No action needed, pain <4. ? ? ?

## 2021-08-24 ENCOUNTER — Other Ambulatory Visit (HOSPITAL_COMMUNITY): Payer: Self-pay

## 2021-08-24 ENCOUNTER — Ambulatory Visit (INDEPENDENT_AMBULATORY_CARE_PROVIDER_SITE_OTHER): Payer: No Typology Code available for payment source | Admitting: Nurse Practitioner

## 2021-08-24 ENCOUNTER — Encounter: Payer: Self-pay | Admitting: Hematology

## 2021-08-24 ENCOUNTER — Encounter: Payer: Self-pay | Admitting: Nurse Practitioner

## 2021-08-24 VITALS — BP 140/80 | HR 84 | Temp 97.6°F | Ht 62.0 in | Wt 195.6 lb

## 2021-08-24 DIAGNOSIS — D500 Iron deficiency anemia secondary to blood loss (chronic): Secondary | ICD-10-CM

## 2021-08-24 DIAGNOSIS — F411 Generalized anxiety disorder: Secondary | ICD-10-CM

## 2021-08-24 DIAGNOSIS — K2950 Unspecified chronic gastritis without bleeding: Secondary | ICD-10-CM | POA: Diagnosis not present

## 2021-08-24 DIAGNOSIS — F5101 Primary insomnia: Secondary | ICD-10-CM | POA: Diagnosis not present

## 2021-08-24 DIAGNOSIS — L811 Chloasma: Secondary | ICD-10-CM

## 2021-08-24 LAB — CBC
HCT: 44.9 % (ref 36.0–46.0)
Hemoglobin: 14.5 g/dL (ref 12.0–15.0)
MCHC: 32.3 g/dL (ref 30.0–36.0)
MCV: 78.8 fl (ref 78.0–100.0)
Platelets: 331 10*3/uL (ref 150.0–400.0)
RBC: 5.7 Mil/uL — ABNORMAL HIGH (ref 3.87–5.11)
RDW: 15.9 % — ABNORMAL HIGH (ref 11.5–15.5)
WBC: 8 10*3/uL (ref 4.0–10.5)

## 2021-08-24 MED ORDER — TRAZODONE HCL 50 MG PO TABS
25.0000 mg | ORAL_TABLET | Freq: Every day | ORAL | 5 refills | Status: DC
  Filled 2021-08-24: qty 30, 30d supply, fill #0

## 2021-08-24 MED ORDER — POLYSACCHARIDE IRON COMPLEX 150 MG PO CAPS
150.0000 mg | ORAL_CAPSULE | Freq: Every day | ORAL | 0 refills | Status: DC
  Filled 2021-08-24: qty 90, 90d supply, fill #0

## 2021-08-24 NOTE — Assessment & Plan Note (Signed)
Reports persistent dark patches on face. No improvement with OTC acne products. ? ?Advised to only use mild oil-free facial moisturizer with SPF 15-30 and mild soap to cleanse skin. Advise to also quit tobacco use, heart healthy nutrition and adequate oral hydration. ?

## 2021-08-24 NOTE — Progress Notes (Signed)
? ?             Established Patient Visit ? ?Patient: Elizabeth Best   DOB: Aug 04, 1955   66 y.o. Female  MRN: [id number] ?Visit Date: 08/24/2021 ? ?Subjective:  ?  ?Chief Complaint  ?Patient presents with  ? Follow-up  ?  Pt would like to discuss visit with her GI doctor.  ?Pt would also like to discuss possible medication for skin care, feels like she has dry/oil face.   ? ?HPI ?Melasma ?Reports persistent dark patches on face. No improvement with OTC acne products. ? ?Advised to only use mild oil-free facial moisturizer with SPF 15-30 and mild soap to cleanse skin. Advise to also quit tobacco use, heart healthy nutrition and adequate oral hydration. ? ?Chronic gastritis without bleeding ?Recent upper endoscopy 08/16/21: Granular gastric mucosa. Biopsied. A single non-bleeding angioectasia in the duodenum. A single duodenal polyp. Resected and retrieved. Biopsies were taken with a cold forceps for Helicobacter pylori testing. ?Pathology report: Surgical [P], duodenal polyp, polyp (1) TUBULAR ADENOMA ?NEGATIVE FOR HIGH-GRADE DYSPLASIA AND CARCINOMA. Surgical [P], gastric antrum. REACTIVE GASTROPATHY WITH CHRONIC GASTRITIS AND INTESTINAL METAPLASIA HELICOBACTER STAIN NEGATIVE ([facility], ADEQUATE CONTROL) ?NEGATIVE FOR DYSPLASIA AND CARCINOMA. Surgical [P], gastric body MILD CHRONIC GASTRITIS WITH LYMPHOID AGGREGATE HELICOBACTER STAIN NEGATIVE ([facility], ADEQUATE CONTROL) NEGATIVE FOR INTESTINAL METAPLASIA, DYSPLASIA AND CARCINOMA. ? ?No GERD symptoms ?Hold use of PPI at this time ? ?GAD (generalized anxiety disorder) ?Chronic, waxing and waxing, related to job responsibilities. ?Affects quality of sleep (difficulty falling and staying asleep) ?No improvement with melatonin and vistaril. ?Daily caffeine and nicotine use ? ?Start trazodone at hs ?Advised to minimize caffeine intake to 1cup in AM ?F/up in 22month? ? ? ?Reviewed medical, surgical, and social history today ? ?Medications: ?Outpatient Medications Prior to Visit   ?Medication Sig  ? albuterol (VENTOLIN HFA) 108 (90 Base) MCG/ACT inhaler Inhale 2 puffs into the lungs every 6 (six) hours as needed for wheezing or shortness of breath.  ? atorvastatin (LIPITOR) 20 MG tablet Take 1 tablet (20 mg total) by mouth daily.  ? colesevelam (WELCHOL) 625 MG tablet Take 1 tablet (625 mg total) by mouth 2 (two) times daily with a meal.  ? [DISCONTINUED] gabapentin (NEURONTIN) 100 MG capsule Take 1 capsule (100 mg total) by mouth 3 (three) times daily.  ? [DISCONTINUED] hydrOXYzine (ATARAX) 10 MG tablet Take 1 tablet (10 mg total) by mouth every 8 (eight) hours as needed for anxiety.  ? [DISCONTINUED] iron polysaccharides (NIFEREX) 150 MG capsule Take 1 capsule (150 mg total) by mouth daily.  ? [DISCONTINUED] omeprazole (PRILOSEC) 40 MG capsule Take 1 capsule (40 mg total) by mouth daily.  ? ?No facility-administered medications prior to visit.  ? ?Reviewed past medical and social history.  ? ?ROS per HPI above ? ? ?   ?Objective:  ?BP 140/80 (BP Location: Left Arm, Patient Position: Sitting, Cuff Size: Large)   Pulse 84   Temp 97.6 ?F (36.4 ?C) (Temporal)   Ht '5\' 2"'$  (1.575 m)   Wt 195 lb 9.6 oz (88.7 kg)   SpO2 97%   BMI 35.78 kg/m?  ? ?  ? ?Physical Exam  ?Results for orders placed or performed in visit on 08/24/21  ?CBC  ?Result Value Ref Range  ? WBC 8.0 4.0 - 10.5 K/uL  ? RBC 5.70 (H) 3.87 - 5.11 Mil/uL  ? Platelets 331.0 150.0 - 400.0 K/uL  ? Hemoglobin 14.5 12.0 - 15.0 g/dL  ? HCT 44.9 36.0 - 46.0 %  ?  MCV 78.8 78.0 - 100.0 fl  ? MCHC 32.3 30.0 - 36.0 g/dL  ? RDW 15.9 (H) 11.5 - 15.5 %  ? ?   ?Assessment & Plan:  ?  ?Problem List Items Addressed This Visit   ? ?  ? Digestive  ? Chronic gastritis without bleeding  ?  Recent upper endoscopy 08/16/21: Granular gastric mucosa. Biopsied. A single non-bleeding angioectasia in the duodenum. A single duodenal polyp. Resected and retrieved. Biopsies were taken with a cold forceps for Helicobacter pylori testing. ?Pathology report:  Surgical [P], duodenal polyp, polyp (1) TUBULAR ADENOMA ?NEGATIVE FOR HIGH-GRADE DYSPLASIA AND CARCINOMA. Surgical [P], gastric antrum. REACTIVE GASTROPATHY WITH CHRONIC GASTRITIS AND INTESTINAL METAPLASIA HELICOBACTER STAIN NEGATIVE ([facility], ADEQUATE CONTROL) ?NEGATIVE FOR DYSPLASIA AND CARCINOMA. Surgical [P], gastric body MILD CHRONIC GASTRITIS WITH LYMPHOID AGGREGATE HELICOBACTER STAIN NEGATIVE ([facility], ADEQUATE CONTROL) NEGATIVE FOR INTESTINAL METAPLASIA, DYSPLASIA AND CARCINOMA. ? ?No GERD symptoms ?Hold use of PPI at this time ? ?  ?  ?  ? Musculoskeletal and Integument  ? Melasma  ?  Reports persistent dark patches on face. No improvement with OTC acne products. ? ?Advised to only use mild oil-free facial moisturizer with SPF 15-30 and mild soap to cleanse skin. Advise to also quit tobacco use, heart healthy nutrition and adequate oral hydration. ? ?  ?  ?  ? Other  ? GAD (generalized anxiety disorder) - Primary  ?  Chronic, waxing and waxing, related to job responsibilities. ?Affects quality of sleep (difficulty falling and staying asleep) ?No improvement with melatonin and vistaril. ?Daily caffeine and nicotine use ? ?Start trazodone at hs ?Advised to minimize caffeine intake to 1cup in AM ?F/up in 76month? ? ?  ?  ? Relevant Medications  ? traZODone (DESYREL) 50 MG tablet  ? Insomnia  ? Relevant Medications  ? traZODone (DESYREL) 50 MG tablet  ? Iron deficiency anemia  ? Relevant Medications  ? iron polysaccharides (NIFEREX) 150 MG capsule  ? Other Relevant Orders  ? CBC (Completed)  ? Iron, TIBC and Ferritin Panel  ? ?Return in about 4 weeks (around 09/21/2021) for GAD. ? ?  ? ?CWilfred Lacy NP ? ? ?

## 2021-08-24 NOTE — Assessment & Plan Note (Signed)
Recent upper endoscopy 08/16/21: Granular gastric mucosa. Biopsied. A single non-bleeding angioectasia in the duodenum. A single duodenal polyp. Resected and retrieved. Biopsies were taken with a cold forceps for Helicobacter pylori testing. ?Pathology report: Surgical [P], duodenal polyp, polyp (1) TUBULAR ADENOMA ?NEGATIVE FOR HIGH-GRADE DYSPLASIA AND CARCINOMA. Surgical [P], gastric antrum. REACTIVE GASTROPATHY WITH CHRONIC GASTRITIS AND INTESTINAL METAPLASIA HELICOBACTER STAIN NEGATIVE ([facility], ADEQUATE CONTROL) ?NEGATIVE FOR DYSPLASIA AND CARCINOMA. Surgical [P], gastric body MILD CHRONIC GASTRITIS WITH LYMPHOID AGGREGATE HELICOBACTER STAIN NEGATIVE ([facility], ADEQUATE CONTROL) NEGATIVE FOR INTESTINAL METAPLASIA, DYSPLASIA AND CARCINOMA. ? ?No GERD symptoms ?Hold use of PPI at this time ?

## 2021-08-24 NOTE — Assessment & Plan Note (Signed)
Chronic, waxing and waxing, related to job responsibilities. ?Affects quality of sleep (difficulty falling and staying asleep) ?No improvement with melatonin and vistaril. ?Daily caffeine and nicotine use ? ?Start trazodone at hs ?Advised to minimize caffeine intake to 1cup in AM ?F/up in 16month? ?

## 2021-08-24 NOTE — Patient Instructions (Signed)
Continue to hold omeprazole. ?Resume oral iron supplement ?Start colace '100mg'$  BID to help manage constipation. ?Start trazodone 25-'50mg'$  at bedtime. ?Minimize caffeine intake to 1cup in AM to help improve insomnia ? ?Insomnia ?Insomnia is a sleep disorder that makes it difficult to fall asleep or stay asleep. Insomnia can cause fatigue, low energy, difficulty concentrating, mood swings, and poor performance at work or school. ?There are three different ways to classify insomnia: ?Difficulty falling asleep. ?Difficulty staying asleep. ?Waking up too early in the morning. ?Any type of insomnia can be long-term (chronic) or short-term (acute). Both are common. Short-term insomnia usually lasts for 3 months or less. Chronic insomnia occurs at least three times a week for longer than 3 months. ?What are the causes? ?Insomnia may be caused by another condition, situation, or substance, such as: ?Having certain mental health conditions, such as anxiety and depression. ?Using caffeine, alcohol, tobacco, or drugs. ?Having gastrointestinal conditions, such as gastroesophageal reflux disease (GERD). ?Having certain medical conditions. These include: ?Asthma. ?Alzheimer's disease. ?Stroke. ?Chronic pain. ?An overactive thyroid gland (hyperthyroidism). ?Other sleep disorders, such as restless legs syndrome and sleep apnea. ?Menopause. ?Sometimes, the cause of insomnia may not be known. ?What increases the risk? ?Risk factors for insomnia include: ?Gender. Females are affected more often than males. ?Age. Insomnia is more common as people get older. ?Stress and certain medical and mental health conditions. ?Lack of exercise. ?Having an irregular work schedule. This may include working night shifts and traveling between different time zones. ?What are the signs or symptoms? ?If you have insomnia, the main symptom is having trouble falling asleep or having trouble staying asleep. This may lead to other symptoms, such as: ?Feeling  tired or having low energy. ?Feeling nervous about going to sleep. ?Not feeling rested in the morning. ?Having trouble concentrating. ?Feeling irritable, anxious, or depressed. ?How is this diagnosed? ?This condition may be diagnosed based on: ?Your symptoms and medical history. Your health care provider may ask about: ?Your sleep habits. ?Any medical conditions you have. ?Your mental health. ?A physical exam. ?How is this treated? ?Treatment for insomnia depends on the cause. Treatment may focus on treating an underlying condition that is causing the insomnia. Treatment may also include: ?Medicines to help you sleep. ?Counseling or therapy. ?Lifestyle adjustments to help you sleep better. ?Follow these instructions at home: ?Eating and drinking ? ?Limit or avoid alcohol, caffeinated beverages, and products that contain nicotine and tobacco, especially close to bedtime. These can disrupt your sleep. ?Do not eat a large meal or eat spicy foods right before bedtime. This can lead to digestive discomfort that can make it hard for you to sleep. ?Sleep habits ? ?Keep a sleep diary to help you and your health care provider figure out what could be causing your insomnia. Write down: ?When you sleep. ?When you wake up during the night. ?How well you sleep and how rested you feel the next day. ?Any side effects of medicines you are taking. ?What you eat and drink. ?Make your bedroom a dark, comfortable place where it is easy to fall asleep. ?Put up shades or blackout curtains to block light from outside. ?Use a white noise machine to block noise. ?Keep the temperature cool. ?Limit screen use before bedtime. This includes: ?Not watching TV. ?Not using your smartphone, tablet, or computer. ?Stick to a routine that includes going to bed and waking up at the same times every day and night. This can help you fall asleep faster. Consider making a quiet activity,  such as reading, part of your nighttime routine. ?Try to avoid taking  naps during the day so that you sleep better at night. ?Get out of bed if you are still awake after 15 minutes of trying to sleep. Keep the lights down, but try reading or doing a quiet activity. When you feel sleepy, go back to bed. ?General instructions ?Take over-the-counter and prescription medicines only as told by your health care provider. ?Exercise regularly as told by your health care provider. However, avoid exercising in the hours right before bedtime. ?Use relaxation techniques to manage stress. Ask your health care provider to suggest some techniques that may work well for you. These may include: ?Breathing exercises. ?Routines to release muscle tension. ?Visualizing peaceful scenes. ?Make sure that you drive carefully. Do not drive if you feel very sleepy. ?Keep all follow-up visits. This is important. ?Contact a health care provider if: ?You are tired throughout the day. ?You have trouble in your daily routine due to sleepiness. ?You continue to have sleep problems, or your sleep problems get worse. ?Get help right away if: ?You have thoughts about hurting yourself or someone else. ?Get help right away if you feel like you may hurt yourself or others, or have thoughts about taking your own life. Go to your nearest emergency room or: ?Call 911. ?Call the [facility] at [phone] or 988. This is open 24 hours a day. ?Text the Crisis Text Line at 706-534-7541. ?Summary ?Insomnia is a sleep disorder that makes it difficult to fall asleep or stay asleep. ?Insomnia can be long-term (chronic) or short-term (acute). ?Treatment for insomnia depends on the cause. Treatment may focus on treating an underlying condition that is causing the insomnia. ?Keep a sleep diary to help you and your health care provider figure out what could be causing your insomnia. ?This information is not intended to replace advice given to you by your health care provider. Make sure you discuss any questions you  have with your health care provider. ?Document Revised: 03/19/2021 Document Reviewed: 03/19/2021 ?Elsevier Patient Education ? [place]. ? ?

## 2021-08-25 LAB — IRON,TIBC AND FERRITIN PANEL
%SAT: 14 % (calc) — ABNORMAL LOW (ref 16–45)
Ferritin: 40 ng/mL (ref 16–288)
Iron: 47 ug/dL (ref 45–160)
TIBC: 339 mcg/dL (calc) (ref 250–450)

## 2021-08-25 NOTE — Progress Notes (Signed)
Elizabeth Best,  ?The polyp removed from your duodenum is completely benign, but is considered 'precancerous' meaning that it would have had the potential to turn into cancer had it not been completely removed. ?I recommend repeating an upper endoscopy in 1 year to re-examine the duodenum, to make sure there is no residual polyp and that no new polyps have grown. ? ?The biopsies of your stomach showed chronic gastritis (inflammation) with gastric intestinal metaplasia, limited to the antrum only.  This is a fairly common finding, but is also considered to be a potentially precancerous change to the cells in the stomach.  For some patients, surveillance endoscopies every 3-5 years is recommended.  We can discuss this further at the time of your next endoscopy next year, or at an office visit. ? ?There was no evidence of H. Pylori infection in the stomach biopsies, indicating that the bacteria was successfully eradicated.

## 2021-08-28 ENCOUNTER — Encounter: Payer: Self-pay | Admitting: Nurse Practitioner

## 2021-09-14 ENCOUNTER — Ambulatory Visit
Admission: RE | Admit: 2021-09-14 | Discharge: 2021-09-14 | Disposition: A | Discharge status: Home / Self Care | Payer: No Typology Code available for payment source | Source: Ambulatory Visit | Attending: Registered Nurse | Admitting: Registered Nurse

## 2021-09-14 DIAGNOSIS — Z8739 Personal history of other diseases of the musculoskeletal system and connective tissue: Secondary | ICD-10-CM

## 2021-09-18 ENCOUNTER — Other Ambulatory Visit (HOSPITAL_COMMUNITY): Payer: Self-pay

## 2021-09-26 ENCOUNTER — Encounter: Payer: Self-pay | Admitting: Nurse Practitioner

## 2021-09-26 ENCOUNTER — Encounter: Payer: Self-pay | Admitting: Hematology

## 2021-09-26 ENCOUNTER — Other Ambulatory Visit (HOSPITAL_COMMUNITY): Payer: Self-pay

## 2021-09-26 ENCOUNTER — Telehealth: Payer: Self-pay | Admitting: Nurse Practitioner

## 2021-09-26 ENCOUNTER — Ambulatory Visit (INDEPENDENT_AMBULATORY_CARE_PROVIDER_SITE_OTHER): Payer: No Typology Code available for payment source | Admitting: Nurse Practitioner

## 2021-09-26 VITALS — BP 122/82 | HR 66 | Temp 96.1°F | Ht 61.0 in | Wt 197.6 lb

## 2021-09-26 DIAGNOSIS — F411 Generalized anxiety disorder: Secondary | ICD-10-CM

## 2021-09-26 DIAGNOSIS — E669 Obesity, unspecified: Secondary | ICD-10-CM | POA: Insufficient documentation

## 2021-09-26 DIAGNOSIS — F17209 Nicotine dependence, unspecified, with unspecified nicotine-induced disorders: Secondary | ICD-10-CM | POA: Insufficient documentation

## 2021-09-26 DIAGNOSIS — R739 Hyperglycemia, unspecified: Secondary | ICD-10-CM | POA: Diagnosis not present

## 2021-09-26 DIAGNOSIS — M8580 Other specified disorders of bone density and structure, unspecified site: Secondary | ICD-10-CM

## 2021-09-26 DIAGNOSIS — Z780 Asymptomatic menopausal state: Secondary | ICD-10-CM

## 2021-09-26 DIAGNOSIS — Z6837 Body mass index (BMI) 37.0-37.9, adult: Secondary | ICD-10-CM

## 2021-09-26 DIAGNOSIS — E66811 Obesity, class 1: Secondary | ICD-10-CM | POA: Insufficient documentation

## 2021-09-26 DIAGNOSIS — J449 Chronic obstructive pulmonary disease, unspecified: Secondary | ICD-10-CM | POA: Insufficient documentation

## 2021-09-26 DIAGNOSIS — F5101 Primary insomnia: Secondary | ICD-10-CM

## 2021-09-26 DIAGNOSIS — Z716 Tobacco abuse counseling: Secondary | ICD-10-CM

## 2021-09-26 LAB — POCT GLYCOSYLATED HEMOGLOBIN (HGB A1C): Hemoglobin A1C: 5.7 % — AB (ref 4.0–5.6)

## 2021-09-26 MED ORDER — WEGOVY 0.25 MG/0.5ML ~~LOC~~ SOAJ
0.2500 mg | SUBCUTANEOUS | 0 refills | Status: DC
  Filled 2021-09-26: qty 2, 28d supply, fill #0

## 2021-09-26 MED ORDER — SAXENDA 18 MG/3ML ~~LOC~~ SOPN
PEN_INJECTOR | SUBCUTANEOUS | 0 refills | Status: DC
  Filled 2021-09-26: qty 3, 28d supply, fill #0

## 2021-09-26 MED ORDER — BUPROPION HCL ER (SR) 150 MG PO TB12
150.0000 mg | ORAL_TABLET | ORAL | 5 refills | Status: DC
  Filled 2021-09-26: qty 60, 30d supply, fill #0
  Filled 2021-11-01: qty 60, 30d supply, fill #1

## 2021-09-26 NOTE — Progress Notes (Signed)
Established Patient Visit  Patient: Elizabeth Best   DOB: December 30, 1955   66 y.o. Female  MRN: [id number] Visit Date: 09/26/2021  Subjective:    Chief Complaint  Patient presents with   Office Visit    4 week f/u anxiety Wants to start Weight loss injection.    HPI GAD (generalized anxiety disorder) Unable to tolerate trazodone even at low dose (daytime somnolence despite 8hrs of sleep). Declined referral to counselor due to work schedule  Insomnia Reports chronic fatigue despite 8hrs of sleep. Drinks 4cups of coffee per day, last cup at 10am bedtime 9-10pm, wakes up at 5:30am Sleep study 2017: negative OSA, snoring noted. Weight loss and dental device recommended. Normal B12 and TSH Mild vit. D insufficiency: takes daily vit. D 1000IU  Unable to tolerate trazodone and vistaril Advised to use melatonin 5-'10mg'$  prn, also decrease caffeine consumption  Tobacco use disorder, continuous Quit smoking x 7days with nicoderm '21mg'$ . Resume tobacco use yesterday. We discussed use of wellbutrin vs chantix. She agreed to use wellbutrin New rx sent F/up in 25month Class 2 severe obesity due to excess calories with serious comorbidity and body mass index (BMI) of 37.0 to 37.9 in adult ([facility]. We discussed use of wegovy vs saxenda, possible side effects and contraindications. Advised about the importance of maintain heart healthy diet, decreased daily calories, and daily exercise. She agreed to start wegovy. New rx sent. F/up in 181month Osteopenia after menopause Advised about need for daily calcium '800mg'$  and vit. D 1000IU, maintain weight bearing exercise and resistant training Repeat dexa scan in 2-3y21yr Wt Readings from Last 3 Encounters:  09/26/21 197 lb 9.6 oz (89.6 kg)  08/24/21 195 lb 9.6 oz (88.7 kg)  08/16/21 197 lb (89.4 kg)       09/26/2021   12:00 PM 08/24/2021     9:32 AM 04/27/2021    2:09 PM  Depression screen PHQ 2/9  Decreased Interest '2 1 1  '$ Down, Depressed, Hopeless 0 1 1  PHQ - 2 Score '2 2 2  '$ Altered sleeping '3 3 3  '$ Tired, decreased energy '1 2 2  '$ Change in appetite 0 1 1  Feeling bad or failure about yourself  0 1 0  Trouble concentrating 1 0 1  Moving slowly or fidgety/restless 0 0 0  Suicidal thoughts 0 0 0  PHQ-9 Score '7 9 9  '$ Difficult doing work/chores Somewhat difficult Somewhat difficult Somewhat difficult       09/26/2021   12:00 PM 08/24/2021    9:32 AM 04/27/2021    2:09 PM  Depression screen PHQ 2/9  Decreased Interest '2 1 1  '$ Down, Depressed, Hopeless 0 1 1  PHQ - 2 Score '2 2 2  '$ Altered sleeping '3 3 3  '$ Tired, decreased energy '1 2 2  '$ Change in appetite 0 1 1  Feeling bad or failure about yourself  0 1 0  Trouble concentrating 1 0 1  Moving slowly or fidgety/restless 0 0 0  Suicidal thoughts 0 0 0  PHQ-9 Score '7 9 9  '$ Difficult doing work/chores Somewhat difficult Somewhat difficult Somewhat difficult    Reviewed medical, surgical, and social history today  Medications: Outpatient Medications Prior to Visit  Medication Sig   albuterol (VENTOLIN HFA) 108 (90 Base) MCG/ACT inhaler Inhale 2 puffs into the lungs every  6 (six) hours as needed for wheezing or shortness of breath.   atorvastatin (LIPITOR) 20 MG tablet Take 1 tablet (20 mg total) by mouth daily.   cholecalciferol (VITAMIN D3) 25 MCG (1000 UNIT) tablet Take 1,000 Units by mouth daily.   colesevelam (WELCHOL) 625 MG tablet Take 1 tablet (625 mg total) by mouth 2 (two) times daily with a meal.   Multiple Vitamin (MULTIVITAMIN) capsule Take 1 capsule by mouth daily.   [DISCONTINUED] traZODone (DESYREL) 50 MG tablet Take 0.5-1 tablets (25-50 mg total) by mouth at bedtime.   iron polysaccharides (NIFEREX) 150 MG capsule Take 1 capsule (150 mg total) by mouth daily. (Patient not taking: Reported on 09/26/2021)   No facility-administered medications prior to visit.    Reviewed past medical and social history.   ROS per HPI above      Objective:  BP 122/82 (BP Location: Right Arm, Patient Position: Sitting, Cuff Size: Normal)   Pulse 66   Temp (!) 96.1 F (35.6 C) (Temporal)   Ht '5\' 1"'$  (1.549 m)   Wt 197 lb 9.6 oz (89.6 kg)   SpO2 96%   BMI 37.34 kg/m      Physical Exam  Results for orders placed or performed in visit on 09/26/21  POCT glycosylated hemoglobin (Hb A1C)  Result Value Ref Range   Hemoglobin A1C 5.7 (A) 4.0 - 5.6 %      Assessment & Plan:    Problem List Items Addressed This Visit       Musculoskeletal and Integument   Osteopenia after menopause    Advised about need for daily calcium '800mg'$  and vit. D 1000IU, maintain weight bearing exercise and resistant training Repeat dexa scan in 2-23yr         Other   Class 2 severe obesity due to excess calories with serious comorbidity and body mass index (BMI) of 37.0 to 37.9 in adult ([facility]    [facility]. We discussed use of wegovy vs saxenda, possible side effects and contraindications. Advised about the importance of maintain heart healthy diet, decreased daily calories, and daily exercise. She agreed to start wegovy. New rx sent. F/up in 174month     Relevant Medications   Semaglutide-Weight Management (WEGOVY) 0.25 MG/0.5ML SOAJ   GAD (generalized anxiety disorder) - Primary    Unable to tolerate trazodone even at low dose (daytime somnolence despite 8hrs of sleep). Declined referral to counselor due to work schedule       Relevant Medications   buPROPion (WELLBUTRIN SR) 150 MG 12 hr tablet   Insomnia    Reports chronic fatigue despite 8hrs of sleep. Drinks 4cups of coffee per day, last cup at 10am bedtime 9-10pm, wakes up at 5:30am Sleep study 2017: negative OSA, snoring noted. Weight loss and dental device recommended. Normal B12 and TSH Mild vit. D insufficiency: takes  daily vit. D 1000IU  Unable to tolerate trazodone and vistaril Advised to use melatonin 5-'10mg'$  prn, also decrease caffeine consumption       Tobacco use disorder, continuous    Quit smoking x 7days with nicoderm '21mg'$ . Resume tobacco use yesterday. We discussed use of wellbutrin vs chantix. She agreed to use wellbutrin New rx sent F/up in 67m22month    Relevant Medications   buPROPion (WELLBUTRIN SR) 150 MG 12 hr tablet   Other Visit Diagnoses     Hyperglycemia       Relevant  Orders   POCT glycosylated hemoglobin (Hb A1C) (Completed)   Encounter for tobacco use cessation counseling       Relevant Medications   buPROPion (WELLBUTRIN SR) 150 MG 12 hr tablet      Return in about 4 weeks (around 10/24/2021) for weight management and tobacco use.     Wilfred Lacy, NP

## 2021-09-26 NOTE — Assessment & Plan Note (Addendum)
Unable to tolerate trazodone even at low dose (daytime somnolence despite 8hrs of sleep). Declined referral to counselor due to work schedule

## 2021-09-26 NOTE — Assessment & Plan Note (Signed)
Quit smoking x 7days with nicoderm '21mg'$ . Resume tobacco use yesterday. We discussed use of wellbutrin vs chantix. She agreed to use wellbutrin New rx sent F/up in 87month

## 2021-09-26 NOTE — Telephone Encounter (Signed)
Pt called and stated that the pharmacy said she don't qualify for a certain medication (weight loss medication) please call pt/pharmacy

## 2021-09-26 NOTE — Assessment & Plan Note (Addendum)
Reports chronic fatigue despite 8hrs of sleep. Drinks 4cups of coffee per day, last cup at 10am bedtime 9-10pm, wakes up at 5:30am Sleep study 2017: negative OSA, snoring noted. Weight loss and dental device recommended. Normal B12 and TSH Mild vit. D deficiency: takes daily vit. D 1000IU  Unable to tolerate trazodone and vistaril Advised to use melatonin 5-'10mg'$  prn, also decrease caffeine consumption

## 2021-09-26 NOTE — Assessment & Plan Note (Addendum)
HgbA1c at 5.7% Normal TSH and lipid panel, except low HDL Mild Vit. D deficiency: has start vit. D 1000IU daily Plans to start exercise program at [place] and meet with the nutritionist. We discussed use of wegovy vs saxenda, possible side effects and contraindications. Advised about the importance of maintain heart healthy diet, decreased daily calories, and daily exercise. She agreed to start wegovy. New rx sent. F/up in 56month

## 2021-09-26 NOTE — Telephone Encounter (Signed)
Called & spoke w/ pharmacy, medication is on back order until September, the only strength they have is 1.7 &  0.25 wouldn't be able to come from this.

## 2021-09-26 NOTE — Patient Instructions (Signed)
Start wellbutrin and wegovy as discussed Start heart healthy diet and daily exercise.  How to Increase Your Level of Physical Activity Getting regular physical activity is important for your overall health and well-being. Most people do not get enough exercise. There are easy ways to increase your level of physical activity, even if you have not been very active in the past or if you are just starting out. What are the benefits of physical activity? Physical activity has many short-term and long-term benefits. Being active on a regular basis can improve your physical and mental health as well as provide other benefits. Physical health benefits Helping you lose weight or maintain a healthy weight. Strengthening your muscles and bones. Reducing your risk of certain long-term (chronic) diseases, including heart disease, cancer, and diabetes. Being able to move around more easily and for longer periods of time without getting tired (increased endurance or stamina). Improving your ability to fight off illness (enhanced immunity). Being able to sleep better. Helping you stay healthy as you get older, including: Helping you stay mobile, or capable of walking and moving around. Preventing accidents, such as falls. Increasing life expectancy. Mental health benefits Boosting your mood and improving your self-esteem. Lowering your chance of having mental health problems, such as depression or anxiety. Helping you feel good about your body. Other benefits Finding new sources of fun and enjoyment. Meeting new people who share a common interest. Before you begin If you have a chronic illness or have not been active for a while, check with your health care provider about how to get started. Ask your health care provider what activities are safe for you. Start out slowly. Walking or doing some simple chair exercises is a good place to start, especially if you have not been active before or for a long  time. Set goals that you can work toward. Ask your health care provider how much exercise is best for you. In general, most adults should: Do moderate-intensity exercise for at least 150 minutes each week (30 minutes on most days of the week) or vigorous exercise for at least 75 minutes each week, or a combination of these. Moderate-intensity exercise can include walking at a quick pace, biking, yoga, water aerobics, or gardening. Vigorous exercise involves activities that take more effort, such as jogging or running, playing sports, swimming laps, or jumping rope. Do strength exercises on at least 2 days each week. This can include weight lifting, body weight exercises, and resistance-band exercises. How to be more physically active Make a plan  Try to find activities that you enjoy. You are more likely to commit to an exercise routine if it does not feel like a chore. If you have bone or joint problems, choose low-impact exercises, like walking or swimming. Use these tips for being successful with an exercise plan: Find a workout partner for accountability. Join a group or class, such as an aerobics class, cycling class, or sports team. Make family time active. Go for a walk, bike, or swim. Include a variety of exercises each week. Consider using a fitness tracker, such as a mobile phone app or a device worn like a watch, that will count the number of steps you take each day. Many people strive to reach 10,000 steps a day. Find ways to be active in your daily routines Besides your formal exercise plans, you can find ways to do physical activity during your daily routines, such as: Walking or biking to work or to the store. Taking  the stairs instead of the elevator. Parking farther away from the door at work or at the store. Planning walking meetings. Walking around while you are on the phone. Where to find more information Centers for Disease Control and Prevention:  WorkDashboard.es President's Council on Fitness, Sports & Nutrition: www.fitness.gov ChooseMyPlate: MassVoice.es Contact a health care provider if: You have headaches, muscle aches, or joint pain that is concerning. You feel dizzy or light-headed while exercising. You faint. You feel your heart skipping, racing, or fluttering. You have chest pain while exercising. Summary Exercise benefits your mind and body at any age, even if you are just starting out. If you have a chronic illness or have not been active for a while, check with your health care provider before increasing your physical activity. Choose activities that are safe and enjoyable for you. Ask your health care provider what activities are safe for you. Start slowly. Tell your health care provider if you have problems as you start to increase your activity level. This information is not intended to replace advice given to you by your health care provider. Make sure you discuss any questions you have with your health care provider. Document Revised: 08/04/2020 Document Reviewed: 08/04/2020 Elsevier Patient Education  [place] for [employer] Calories are units of energy. Your body needs a certain number of calories from food to keep going throughout the day. When you eat or drink more calories than your body needs, your body stores the extra calories mostly as fat. When you eat or drink fewer calories than your body needs, your body burns fat to get the energy it needs. Calorie counting means keeping track of how many calories you eat and drink each day. Calorie counting can be helpful if you need to lose weight. If you eat fewer calories than your body needs, you should lose weight. Ask your health care provider what a healthy weight is for you. For calorie counting to work, you will need to eat the right number of calories each day to lose a healthy amount of weight per week. A dietitian  can help you figure out how many calories you need in a day and will suggest ways to reach your calorie goal. A healthy amount of weight to lose each week is usually 1-2 lb (0.5-0.9 kg). This usually means that your daily calorie intake should be reduced by 500-750 calories. Eating 1,200-1,500 calories a day can help most women lose weight. Eating 1,500-1,800 calories a day can help most men lose weight. What do I need to know about calorie counting? Work with your health care provider or dietitian to determine how many calories you should get each day. To meet your daily calorie goal, you will need to: Find out how many calories are in each food that you would like to eat. Try to do this before you eat. Decide how much of the food you plan to eat. Keep a food log. Do this by writing down what you ate and how many calories it had. To successfully lose weight, it is important to balance calorie counting with a healthy lifestyle that includes regular activity. Where do I find calorie information?  The number of calories in a food can be found on a Nutrition Facts label. If a food does not have a Nutrition Facts label, try to look up the calories online or ask your dietitian for help. Remember that calories are listed per serving. If you choose to have more  than one serving of a food, you will have to multiply the calories per serving by the number of servings you plan to eat. For example, the label on a package of bread might say that a serving size is 1 slice and that there are 90 calories in a serving. If you eat 1 slice, you will have eaten 90 calories. If you eat 2 slices, you will have eaten 180 calories. How do I keep a food log? After each time that you eat, record the following in your food log as soon as possible: What you ate. Be sure to include toppings, sauces, and other extras on the food. How much you ate. This can be measured in cups, ounces, or number of items. How many calories were  in each food and drink. The total number of calories in the food you ate. Keep your food log near you, such as in a pocket-sized notebook or on an app or website on your mobile phone. Some programs will calculate calories for you and show you how many calories you have left to meet your daily goal. What are some portion-control tips? Know how many calories are in a serving. This will help you know how many servings you can have of a certain food. Use a measuring cup to measure serving sizes. You could also try weighing out portions on a kitchen scale. With time, you will be able to estimate serving sizes for some foods. Take time to put servings of different foods on your favorite plates or in your favorite bowls and cups so you know what a serving looks like. Try not to eat straight from a food's packaging, such as from a bag or box. Eating straight from the package makes it hard to see how much you are eating and can lead to overeating. Put the amount you would like to eat in a cup or on a plate to make sure you are eating the right portion. Use smaller plates, glasses, and bowls for smaller portions and to prevent overeating. Try not to multitask. For example, avoid watching TV or using your computer while eating. If it is time to eat, sit down at a table and enjoy your food. This will help you recognize when you are full. It will also help you be more mindful of what and how much you are eating. What are tips for following this plan? Reading food labels Check the calorie count compared with the serving size. The serving size may be smaller than what you are used to eating. Check the source of the calories. Try to choose foods that are high in protein, fiber, and vitamins, and low in saturated fat, trans fat, and sodium. Shopping Read nutrition labels while you shop. This will help you make healthy decisions about which foods to buy. Pay attention to nutrition labels for low-fat or fat-free foods.  These foods sometimes have the same number of calories or more calories than the full-fat versions. They also often have added sugar, starch, or salt to make up for flavor that was removed with the fat. Make a grocery list of lower-calorie foods and stick to it. Cooking Try to cook your favorite foods in a healthier way. For example, try baking instead of frying. Use low-fat dairy products. Meal planning Use more fruits and vegetables. One-half of your plate should be fruits and vegetables. Include lean proteins, such as chicken, [place], and fish. Lifestyle Each week, aim to do one of the following: 150 minutes  of moderate exercise, such as walking. 75 minutes of vigorous exercise, such as running. General information Know how many calories are in the foods you eat most often. This will help you calculate calorie counts faster. Find a way of tracking calories that works for you. Get creative. Try different apps or programs if writing down calories does not work for you. What foods should I eat?  Eat nutritious foods. It is better to have a nutritious, high-calorie food, such as an avocado, than a food with few nutrients, such as a bag of potato chips. Use your calories on foods and drinks that will fill you up and will not leave you hungry soon after eating. Examples of foods that fill you up are nuts and nut butters, vegetables, lean proteins, and high-fiber foods such as whole grains. High-fiber foods are foods with more than 5 g of fiber per serving. Pay attention to calories in drinks. Low-calorie drinks include water and unsweetened drinks. The items listed above may not be a complete list of foods and beverages you can eat. Contact a dietitian for more information. What foods should I limit? Limit foods or drinks that are not good sources of vitamins, minerals, or protein or that are high in unhealthy fats. These include: Candy. Other sweets. Sodas, specialty coffee drinks, alcohol,  and juice. The items listed above may not be a complete list of foods and beverages you should avoid. Contact a dietitian for more information. How do I count calories when eating out? Pay attention to portions. Often, portions are much larger when eating out. Try these tips to keep portions smaller: Consider sharing a meal instead of getting your own. If you get your own meal, eat only half of it. Before you start eating, ask for a container and put half of your meal into it. When available, consider ordering smaller portions from the menu instead of full portions. Pay attention to your food and drink choices. Knowing the way food is cooked and what is included with the meal can help you eat fewer calories. If calories are listed on the menu, choose the lower-calorie options. Choose dishes that include vegetables, fruits, whole grains, low-fat dairy products, and lean proteins. Choose items that are boiled, broiled, grilled, or steamed. Avoid items that are buttered, battered, fried, or served with cream sauce. Items labeled as crispy are usually fried, unless stated otherwise. Choose water, low-fat milk, unsweetened iced tea, or other drinks without added sugar. If you want an alcoholic beverage, choose a lower-calorie option, such as a glass of wine or light beer. Ask for dressings, sauces, and syrups on the side. These are usually high in calories, so you should limit the amount you eat. If you want a salad, choose a garden salad and ask for grilled meats. Avoid extra toppings such as bacon, cheese, or fried items. Ask for the dressing on the side, or ask for olive oil and vinegar or lemon to use as dressing. Estimate how many servings of a food you are given. Knowing serving sizes will help you be aware of how much food you are eating at restaurants. Where to find more information Centers for Disease Control and Prevention: http://www.wolf.info/ U.S. Department of Agriculture:  http://www.wilson-mendoza.org/ Summary Calorie counting means keeping track of how many calories you eat and drink each day. If you eat fewer calories than your body needs, you should lose weight. A healthy amount of weight to lose per week is usually 1-2 lb (0.5-0.9 kg). This usually  means reducing your daily calorie intake by 500-750 calories. The number of calories in a food can be found on a Nutrition Facts label. If a food does not have a Nutrition Facts label, try to look up the calories online or ask your dietitian for help. Use smaller plates, glasses, and bowls for smaller portions and to prevent overeating. Use your calories on foods and drinks that will fill you up and not leave you hungry shortly after a meal. This information is not intended to replace advice given to you by your health care provider. Make sure you discuss any questions you have with your health care provider. Document Revised: 05/20/2019 Document Reviewed: 05/20/2019 Elsevier Patient Education  [place].

## 2021-09-26 NOTE — Telephone Encounter (Signed)
Pt advised. She is interested in starting Saxenda while waiting for wegovy.

## 2021-09-26 NOTE — Assessment & Plan Note (Signed)
Advised about need for daily calcium '800mg'$  and vit. D 1000IU, maintain weight bearing exercise and resistant training Repeat dexa scan in 2-29yr

## 2021-09-26 NOTE — Assessment & Plan Note (Signed)
>>  ASSESSMENT AND PLAN FOR OBESITY (BMI 30.0-34.9) WRITTEN ON 09/26/2021 12:58 PM BY Miles Leyda LUM, NP  HgbA1c at 5.7% Normal TSH and lipid panel, except low HDL Mild Vit. D deficiency: has start vit. D 1000IU daily Plans to start [facility] program at [facility] and meet with the nutritionist. We discussed use of wegovy vs saxenda, possible side effects and contraindications. Advised about the importance of maintain heart healthy diet, decreased daily calories, and daily exercise. She agreed to start wegovy. New rx sent. F/up in 23month

## 2021-09-27 ENCOUNTER — Other Ambulatory Visit (HOSPITAL_COMMUNITY): Payer: Self-pay

## 2021-09-27 ENCOUNTER — Other Ambulatory Visit: Payer: Self-pay | Admitting: Nurse Practitioner

## 2021-09-27 ENCOUNTER — Telehealth: Payer: Self-pay | Admitting: Nurse Practitioner

## 2021-09-27 MED ORDER — SAXENDA 18 MG/3ML ~~LOC~~ SOPN
PEN_INJECTOR | SUBCUTANEOUS | 0 refills | Status: DC
  Filled 2021-09-27 – 2021-09-28 (×2): qty 15, 42d supply, fill #0

## 2021-09-27 NOTE — Telephone Encounter (Signed)
PA Sent

## 2021-10-01 ENCOUNTER — Other Ambulatory Visit (HOSPITAL_COMMUNITY): Payer: Self-pay

## 2021-10-01 ENCOUNTER — Encounter: Payer: Self-pay | Admitting: Nurse Practitioner

## 2021-10-11 ENCOUNTER — Other Ambulatory Visit (HOSPITAL_COMMUNITY): Payer: Self-pay

## 2021-10-12 ENCOUNTER — Other Ambulatory Visit (HOSPITAL_COMMUNITY): Payer: Self-pay

## 2021-10-31 ENCOUNTER — Ambulatory Visit (INDEPENDENT_AMBULATORY_CARE_PROVIDER_SITE_OTHER): Payer: No Typology Code available for payment source | Admitting: Family Medicine

## 2021-10-31 ENCOUNTER — Encounter: Payer: Self-pay | Admitting: Family Medicine

## 2021-10-31 VITALS — BP 140/84 | HR 89 | Temp 97.7°F | Ht 61.0 in | Wt 191.8 lb

## 2021-10-31 DIAGNOSIS — J069 Acute upper respiratory infection, unspecified: Secondary | ICD-10-CM

## 2021-10-31 LAB — POC COVID19 BINAXNOW: SARS Coronavirus 2 Ag: NEGATIVE

## 2021-10-31 NOTE — Patient Instructions (Signed)
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
## 2021-11-01 ENCOUNTER — Other Ambulatory Visit: Payer: Self-pay | Admitting: Nurse Practitioner

## 2021-11-01 ENCOUNTER — Other Ambulatory Visit (HOSPITAL_COMMUNITY): Payer: Self-pay

## 2021-11-01 DIAGNOSIS — Z6837 Body mass index (BMI) 37.0-37.9, adult: Secondary | ICD-10-CM

## 2021-11-01 MED ORDER — SAXENDA 18 MG/3ML ~~LOC~~ SOPN
PEN_INJECTOR | SUBCUTANEOUS | 0 refills | Status: DC
  Filled 2021-11-01 – 2021-11-02 (×2): qty 15, 42d supply, fill #0
  Filled 2021-11-02 – 2021-11-06 (×3): qty 15, 30d supply, fill #0

## 2021-11-01 NOTE — Telephone Encounter (Signed)
Chart supports Rx Last OV: 10/2021 Next OV:  Not scheduled

## 2021-11-02 ENCOUNTER — Other Ambulatory Visit (HOSPITAL_COMMUNITY): Payer: Self-pay

## 2021-11-05 ENCOUNTER — Other Ambulatory Visit: Payer: Self-pay | Admitting: Registered Nurse

## 2021-11-05 ENCOUNTER — Other Ambulatory Visit (HOSPITAL_COMMUNITY): Payer: Self-pay

## 2021-11-05 DIAGNOSIS — E785 Hyperlipidemia, unspecified: Secondary | ICD-10-CM

## 2021-11-06 ENCOUNTER — Other Ambulatory Visit (HOSPITAL_COMMUNITY): Payer: Self-pay

## 2021-11-06 ENCOUNTER — Other Ambulatory Visit: Payer: Self-pay | Admitting: Nurse Practitioner

## 2021-11-06 DIAGNOSIS — E785 Hyperlipidemia, unspecified: Secondary | ICD-10-CM

## 2021-11-06 MED ORDER — COLESEVELAM HCL 625 MG PO TABS
625.0000 mg | ORAL_TABLET | Freq: Two times a day (BID) | ORAL | 2 refills | Status: DC
  Filled 2021-11-06 – 2022-08-02 (×4): qty 60, 30d supply, fill #0

## 2021-11-06 NOTE — Telephone Encounter (Signed)
Chart supports Rx Last OV: 10/2021 Next OV: Not scheduled   Okay to fill?

## 2021-11-12 ENCOUNTER — Encounter: Payer: Self-pay | Admitting: Nurse Practitioner

## 2021-11-13 ENCOUNTER — Ambulatory Visit: Payer: No Typology Code available for payment source | Admitting: Family Medicine

## 2021-11-14 ENCOUNTER — Encounter (HOSPITAL_COMMUNITY): Payer: Self-pay | Admitting: Emergency Medicine

## 2021-11-14 ENCOUNTER — Other Ambulatory Visit (HOSPITAL_COMMUNITY): Payer: Self-pay

## 2021-11-14 ENCOUNTER — Ambulatory Visit (HOSPITAL_COMMUNITY)
Admission: EM | Admit: 2021-11-14 | Discharge: 2021-11-14 | Disposition: A | Discharge status: Home / Self Care | Payer: No Typology Code available for payment source

## 2021-11-14 ENCOUNTER — Encounter: Payer: Self-pay | Admitting: Nurse Practitioner

## 2021-11-14 ENCOUNTER — Emergency Department (HOSPITAL_COMMUNITY): Payer: No Typology Code available for payment source

## 2021-11-14 ENCOUNTER — Other Ambulatory Visit: Payer: Self-pay

## 2021-11-14 ENCOUNTER — Encounter (HOSPITAL_COMMUNITY): Payer: Self-pay

## 2021-11-14 ENCOUNTER — Emergency Department (HOSPITAL_COMMUNITY)
Admission: EM | Admit: 2021-11-14 | Discharge: 2021-11-14 | Disposition: A | Discharge status: Home / Self Care | Payer: No Typology Code available for payment source | Attending: Emergency Medicine | Admitting: Emergency Medicine

## 2021-11-14 DIAGNOSIS — R1013 Epigastric pain: Secondary | ICD-10-CM | POA: Insufficient documentation

## 2021-11-14 DIAGNOSIS — J040 Acute laryngitis: Secondary | ICD-10-CM | POA: Diagnosis not present

## 2021-11-14 DIAGNOSIS — M542 Cervicalgia: Secondary | ICD-10-CM

## 2021-11-14 DIAGNOSIS — R221 Localized swelling, mass and lump, neck: Secondary | ICD-10-CM

## 2021-11-14 DIAGNOSIS — E079 Disorder of thyroid, unspecified: Secondary | ICD-10-CM | POA: Diagnosis not present

## 2021-11-14 DIAGNOSIS — R1319 Other dysphagia: Secondary | ICD-10-CM

## 2021-11-14 DIAGNOSIS — R059 Cough, unspecified: Secondary | ICD-10-CM | POA: Diagnosis present

## 2021-11-14 DIAGNOSIS — R079 Chest pain, unspecified: Secondary | ICD-10-CM | POA: Diagnosis not present

## 2021-11-14 LAB — CBC
HCT: 41.6 % (ref 36.0–46.0)
Hemoglobin: 13.2 g/dL (ref 12.0–15.0)
MCH: 25.2 pg — ABNORMAL LOW (ref 26.0–34.0)
MCHC: 31.7 g/dL (ref 30.0–36.0)
MCV: 79.4 fL — ABNORMAL LOW (ref 80.0–100.0)
Platelets: 401 10*3/uL — ABNORMAL HIGH (ref 150–400)
RBC: 5.24 MIL/uL — ABNORMAL HIGH (ref 3.87–5.11)
RDW: 15.4 % (ref 11.5–15.5)
WBC: 11.1 10*3/uL — ABNORMAL HIGH (ref 4.0–10.5)
nRBC: 0 % (ref 0.0–0.2)

## 2021-11-14 LAB — HEPATIC FUNCTION PANEL
ALT: 26 U/L (ref 0–44)
AST: 16 U/L (ref 15–41)
Albumin: 3.4 g/dL — ABNORMAL LOW (ref 3.5–5.0)
Alkaline Phosphatase: 94 U/L (ref 38–126)
Bilirubin, Direct: <0.1 mg/dL (ref 0.0–0.2)
Total Bilirubin: 0.5 mg/dL (ref 0.3–1.2)
Total Protein: 6.9 g/dL (ref 6.5–8.1)

## 2021-11-14 LAB — BASIC METABOLIC PANEL
Anion gap: 7 (ref 5–15)
BUN: 5 mg/dL — ABNORMAL LOW (ref 8–23)
CO2: 24 mmol/L (ref 22–32)
Calcium: 9.1 mg/dL (ref 8.9–10.3)
Chloride: 109 mmol/L (ref 98–111)
Creatinine, Ser: 0.76 mg/dL (ref 0.44–1.00)
GFR, Estimated: >60 mL/min (ref >60)
Glucose, Bld: 98 mg/dL (ref 70–99)
Potassium: 3.4 mmol/L — ABNORMAL LOW (ref 3.5–5.1)
Sodium: 140 mmol/L (ref 135–145)

## 2021-11-14 LAB — LIPASE, BLOOD: Lipase: 63 U/L — ABNORMAL HIGH (ref 11–51)

## 2021-11-14 LAB — GROUP A STREP BY PCR: Group A Strep by PCR: NOT DETECTED

## 2021-11-14 MED ORDER — SODIUM CHLORIDE 0.9 % IV BOLUS
1000.0000 mL | Freq: Once | INTRAVENOUS | Status: AC
  Administered 2021-11-14: 1000 mL via INTRAVENOUS

## 2021-11-14 MED ORDER — CLINDAMYCIN HCL 150 MG PO CAPS
300.0000 mg | ORAL_CAPSULE | Freq: Once | ORAL | Status: AC
  Administered 2021-11-14: 300 mg via ORAL
  Filled 2021-11-14: qty 2

## 2021-11-14 MED ORDER — PREDNISONE 20 MG PO TABS: 20.0000 mg | ORAL_TABLET | Freq: Every day | ORAL | 0 refills | Status: AC

## 2021-11-14 MED ORDER — ONDANSETRON HCL 4 MG/2ML IJ SOLN
4.0000 mg | Freq: Once | INTRAMUSCULAR | Status: AC
  Administered 2021-11-14: 4 mg via INTRAVENOUS
  Filled 2021-11-14: qty 2

## 2021-11-14 MED ORDER — IOHEXOL 300 MG/ML  SOLN
75.0000 mL | Freq: Once | INTRAMUSCULAR | Status: AC | PRN
  Administered 2021-11-14: 75 mL via INTRAVENOUS

## 2021-11-14 MED ORDER — LIDOCAINE VISCOUS HCL 2 % MT SOLN: 15.0000 mL | OROMUCOSAL | 0 refills | Status: DC | PRN

## 2021-11-14 MED ORDER — CLINDAMYCIN HCL 300 MG PO CAPS: 300.0000 mg | ORAL_CAPSULE | Freq: Three times a day (TID) | ORAL | 0 refills | Status: AC

## 2021-11-14 MED ORDER — MORPHINE SULFATE (PF) 4 MG/ML IV SOLN
4.0000 mg | Freq: Once | INTRAVENOUS | Status: AC
  Administered 2021-11-14: 4 mg via INTRAVENOUS
  Filled 2021-11-14: qty 1

## 2021-11-14 MED ORDER — PREDNISONE 20 MG PO TABS
60.0000 mg | ORAL_TABLET | Freq: Once | ORAL | Status: AC
  Administered 2021-11-14: 60 mg via ORAL
  Filled 2021-11-14: qty 3

## 2021-11-14 MED ORDER — SAXENDA 18 MG/3ML ~~LOC~~ SOPN: PEN_INJECTOR | SUBCUTANEOUS | 0 refills | Status: DC

## 2021-11-14 NOTE — ED Provider Triage Note (Signed)
Emergency Medicine Provider Triage Evaluation Note  Elizabeth Best , a 66 y.o. female  was evaluated in triage.  Pt complains of sore throat and neck swelling. States that same has been ongoing since July 12 with some URI symptoms including cough and congestion. Was seen for same and had a covid swab that was negative. States that she also had a sore throat at that time. States that same has persisted since then and she now has swelling on the right side of her neck that is painful. Is able to tolerate her own secretions. Went to UC for same and was sent here for evaluation.  Review of Systems  Positive:  Negative:   Physical Exam  BP (!) 155/80   Pulse 84   Temp 98.4 F (36.9 C) (Oral)   Resp 16   SpO2 99%  Gen:   Awake, no distress   Resp:  Normal effort  MSK:   Moves extremities without difficulty  Other:  Right sided neck mass noted which is tender to palpation  Medical Decision Making  Medically screening exam initiated at 5:54 PM.  Appropriate orders placed.  Lissa Merlin was informed that the remainder of the evaluation will be completed by another provider, this initial triage assessment does not replace that evaluation, and the importance of remaining in the ED until their evaluation is complete.     Bud Face, PA-C 11/14/21 1801

## 2021-11-14 NOTE — Discharge Instructions (Addendum)
Call Dr. Janace Hoard the ear nose and throat specialist to be seen about your throat as well as your thyroid nodule any biopsy on  Return for new or worsening symptoms

## 2021-11-14 NOTE — ED Provider Notes (Signed)
[facility] EMERGENCY DEPARTMENT Provider Note   CSN: [id number] Arrival date & time: 11/14/21  1638    History  Neck mass   Elizabeth Best is a 66 y.o. female here for evaluation of mass.  States over the last 3 weeks she initially developed some chills, cough, chest congestion as well as rhinorrhea.  Had some difficulty and painful swallowing.  Was seen by PCP had negative COVID test.  Thought likely viral URI with cough.  She continued to have symptoms.  Persistent painful swallowing.  She developed some right-sided neck and upper chest swelling.  She also has some chronic epigastric pain that she has been followed by GI.  States she had an EGD which did not show any significant normalities.  She has history of prior H. pylori.  States she does get night sweats at night however she thought this is related to menopausal symptoms.  No intentional weight loss.  She has a chronic cough.  Started having some loose stools and some lower abdominal cramping.  Thought maybe she had diverticulitis.  Meds at home are not helping for her sore throat.  Was seen by urgent care today, concern for mass, sent here for further evaluation.  HPI     Home Medications Prior to Admission medications   Medication Sig Start Date End Date Taking? Authorizing Provider  clindamycin (CLEOCIN) 300 MG capsule Take 1 capsule (300 mg total) by mouth 3 (three) times daily for 7 days. 11/14/21 11/21/21 Yes Tianna Baus A, PA-C  lidocaine (XYLOCAINE) 2 % solution Use as directed 15 mLs in the mouth or throat as needed for mouth pain. 11/14/21  Yes Topanga Alvelo A, PA-C  predniSONE (DELTASONE) 20 MG tablet Take 1 tablet (20 mg total) by mouth daily for 8 days. 11/14/21 11/22/21 Yes Sota Hetz A, PA-C  albuterol (VENTOLIN HFA) 108 (90 Base) MCG/ACT inhaler Inhale 2 puffs into the lungs every 6 (six) hours as needed for wheezing or shortness of breath. 11/07/20   Maximiano Coss, NP  atorvastatin  (LIPITOR) 20 MG tablet Take 1 tablet (20 mg total) by mouth daily. 03/29/21   Maximiano Coss, NP  buPROPion [facility] SR) 150 MG 12 hr tablet Take 1 tablet (150 mg total) by mouth in the morning for 7 days, THEN 1 tablet (150 mg total) 2 (two) times daily continuously 09/26/21   Nche, Charlene Brooke, NP  cholecalciferol (VITAMIN D3) 25 MCG (1000 UNIT) tablet Take 1,000 Units by mouth daily.    [provider]  colesevelam [facility]) 625 MG tablet Take 1 tablet (625 mg total) by mouth 2 (two) times daily with a meal. 11/06/21   Libby Maw, MD  iron polysaccharides (NIFEREX) 150 MG capsule Take 1 capsule (150 mg total) by mouth daily. 08/24/21   Nche, Charlene Brooke, NP  Liraglutide -Weight Management (SAXENDA) 18 MG/3ML SOPN Inject 0.6 mg into the skin daily for 7 days, THEN 1.2 mg daily for 7 days, THEN 1.8 mg daily for 7 days, THEN 2.4 mg daily for 7 days, THEN 3 mg daily for 14 days. 11/14/21 12/25/21  Nche, Charlene Brooke, NP  Multiple Vitamin (MULTIVITAMIN) capsule Take 1 capsule by mouth daily.    [provider]      Allergies    Patient has no known allergies.    Review of Systems   Review of Systems  Constitutional:  Positive for activity change and appetite change.  HENT:  Positive for congestion, rhinorrhea, sore throat and trouble swallowing. Negative  for sinus pressure, sinus pain, sneezing and voice change.   Respiratory:  Positive for cough. Negative for apnea, choking, chest tightness, shortness of breath, wheezing and stridor.   Gastrointestinal:  Positive for abdominal pain. Negative for abdominal distention, anal bleeding, blood in stool, constipation, diarrhea, nausea, rectal pain and vomiting.  Genitourinary: Negative.   Musculoskeletal: Negative.   Skin: Negative.   Neurological: Negative.   All other systems reviewed and are negative.  Physical Exam Updated Vital Signs BP (!) 145/74 (BP Location: Right Arm)   Pulse 80   Temp 98.4 F (36.9 C)  (Oral)   Resp 16   SpO2 99%  Physical Exam Vitals and nursing note reviewed.  Constitutional:      General: She is not in acute distress.    Appearance: She is well-developed. She is not ill-appearing, toxic-appearing or diaphoretic.  HENT:     Head: Normocephalic and atraumatic.     Nose: Nose normal.     Mouth/Throat:     Mouth: Mucous membranes are moist.  Eyes:     Pupils: Pupils are equal, round, and reactive to light.  Neck:     Trachea: Trachea and phonation normal.     Comments: 2 in firm, mobile tender right supraclavicular mass  Cardiovascular:     Rate and Rhythm: Normal rate.     Pulses: Normal pulses.     Heart sounds: Normal heart sounds.  Pulmonary:     Effort: Pulmonary effort is normal. No respiratory distress.     Breath sounds: Normal breath sounds.     Comments: Clear bil, speaks in fill sentences without difficulty Abdominal:     General: Bowel sounds are normal. There is no distension.     Palpations: Abdomen is soft.  Musculoskeletal:        General: Normal range of motion.     Cervical back: Full passive range of motion without pain and normal range of motion.  Skin:    General: Skin is warm and dry.     Capillary Refill: Capillary refill takes less than 2 seconds.  Neurological:     General: No focal deficit present.     Mental Status: She is alert.  Psychiatric:        Mood and Affect: Mood normal.     ED Results / Procedures / Treatments   Labs (all labs ordered are listed, but only abnormal results are displayed) Labs Reviewed  CBC - Abnormal; Notable for the following components:      Result Value   WBC 11.1 (*)    RBC 5.24 (*)    MCV 79.4 (*)    MCH 25.2 (*)    Platelets 401 (*)    All other components within normal limits  BASIC METABOLIC PANEL - Abnormal; Notable for the following components:   Potassium 3.4 (*)    BUN 5 (*)    All other components within normal limits  HEPATIC FUNCTION PANEL - Abnormal; Notable for the  following components:   Albumin 3.4 (*)    All other components within normal limits  LIPASE, BLOOD - Abnormal; Notable for the following components:   Lipase 63 (*)    All other components within normal limits  GROUP A STREP BY PCR    EKG None  Radiology CT Soft Tissue Neck W Contrast  Result Date: 11/14/2021 CLINICAL DATA:  Initial evaluation for neck mass.  Sore throat. EXAM: CT NECK WITH CONTRAST TECHNIQUE: Multidetector CT imaging of the neck was performed  using the standard protocol following the bolus administration of intravenous contrast. RADIATION DOSE REDUCTION: This exam was performed according to the departmental dose-optimization program which includes automated exposure control, adjustment of the mA and/or kV according to patient size and/or use of iterative reconstruction technique. CONTRAST:  62m OMNIPAQUE IOHEXOL 300 MG/ML SOLN, 716mOMNIPAQUE IOHEXOL 300 MG/ML SOLN COMPARISON:  Prior ultrasound from 07/24/2020. FINDINGS: Pharynx and larynx: Oral cavity within normal limits. Palatine tonsils symmetric and within normal limits. Remainder of the oropharynx and nasopharynx within normal limits. No retropharyngeal collection or swelling. Epiglottis itself within normal limits. There is question of mild edema and thickening about the aryepiglottic folds, which could reflect changes of acute supraglottitis/laryngitis. Glottis itself is closed but grossly symmetric and within normal limits. Subglottic airway patent clear. Salivary glands: Salivary glands including the parotid and submandibular glands are within normal limits. Thyroid: 14 mm right thyroid nodule, of doubtful significance given size and patient age, no follow-up imaging recommended (ref: J Am Coll Radiol. 2015 Feb;12(2): 143-50).No enlarged or pathologic adenopathy within the neck. Lymph nodes: Normal intravascular enhancement seen throughout the neck. Vascular: Unremarkable. Limited intracranial: Unremarkable. Visualized  orbits: Chronic appearing defect involving the inferior 0 medial bony left orbit with herniation of the intraorbital fat. Visualized globes orbital soft tissues otherwise unremarkable. Mastoids and visualized paranasal sinuses: Scattered mucosal thickening noted about the ethmoidal air cells. Visualized paranasal sinuses are otherwise clear. Visualized mastoids and middle ear cavities are well pneumatized and free of fluid. Skeleton: No discrete or worrisome osseous lesions. Moderate spondylosis at C4-5 through C6-7. Upper chest: Visualized upper chest demonstrates no acute finding. Other: None. IMPRESSION: 1. Question mild edema and thickening about the aryepiglottic folds, which could reflect changes of acute supraglottitis/laryngitis. No discrete abscess or drainable fluid collection. 2. No other acute abnormality within the neck. No adenopathy. 3. Moderate spondylosis at C4-5 through C6-7. Electronically Signed   By: BeJeannine Boga.D.   On: 11/14/2021 23:19   CT CHEST ABDOMEN PELVIS W CONTRAST  Result Date: 11/14/2021 CLINICAL DATA:  Right-sided neck mass, sore throat EXAM: CT CHEST, ABDOMEN, AND PELVIS WITH CONTRAST TECHNIQUE: Multidetector CT imaging of the chest, abdomen and pelvis was performed following the standard protocol during bolus administration of intravenous contrast. RADIATION DOSE REDUCTION: This exam was performed according to the departmental dose-optimization program which includes automated exposure control, adjustment of the mA and/or kV according to patient size and/or use of iterative reconstruction technique. CONTRAST:  7542mMNIPAQUE IOHEXOL 300 MG/ML SOLN, 80m66mNIPAQUE IOHEXOL 300 MG/ML SOLN COMPARISON:  06/10/2016 FINDINGS: CT CHEST FINDINGS Cardiovascular: The heart and great vessels are unremarkable without pericardial effusion. No evidence of thoracic aortic aneurysm or dissection. Minimal atherosclerosis of the aortic arch. Mediastinum/Nodes: Incidental 14 mm  hypodense thyroid nodule exophytic off the right lobe. Trachea and esophagus are unremarkable. No pathologic adenopathy within the mediastinum, hila, or axillary regions. Lungs/Pleura: Areas of subsegmental atelectasis are seen at the lung bases. No acute airspace disease, effusion, or pneumothorax. Mild background emphysema, with likely scattered areas of air trapping within the periphery of the upper and lower lung zones compatible small airway disease. Musculoskeletal: No acute or destructive bony lesions. Reconstructed images demonstrate no additional findings. CT ABDOMEN PELVIS FINDINGS Hepatobiliary: No focal liver abnormality is seen. No gallstones, gallbladder wall thickening, or biliary dilatation. Pancreas: Unremarkable. No pancreatic ductal dilatation or surrounding inflammatory changes. Spleen: Normal in size without focal abnormality. Adrenals/Urinary Tract: Adrenal glands are unremarkable. Kidneys are normal, without renal calculi, focal lesion, or hydronephrosis.  Bladder is unremarkable. Stomach/Bowel: No bowel obstruction or ileus. Normal retrocecal appendix. No bowel wall thickening or inflammatory change. Vascular/Lymphatic: Aortic atherosclerosis. No enlarged abdominal or pelvic lymph nodes. Reproductive: Status post hysterectomy. No adnexal masses. Other: No free fluid or free intraperitoneal gas. No abdominal wall hernia. Musculoskeletal: No acute or destructive bony lesions. Bilateral L5 spondylolysis with grade 1/2 anterior spondylolisthesis of L5 on S1. Reconstructed images demonstrate no additional findings. IMPRESSION: 1. No acute intrathoracic, intra-abdominal, or intrapelvic process. 2. Incidental 14 mm right lobe thyroid nodule. Not clinically significant; no follow-up imaging recommended (ref: J Am Coll Radiol. 2015 Feb;12(2): 143-50). 3. Aortic Atherosclerosis (ICD10-I70.0) and Emphysema (ICD10-J43.9). Electronically Signed   By: Randa Ngo M.D.   On: 11/14/2021 22:48   DG Chest  2 View  Result Date: 11/14/2021 CLINICAL DATA:  Mass. EXAM: CHEST - 2 VIEW COMPARISON:  June 17, 2015. FINDINGS: The heart size and mediastinal contours are within normal limits. Both lungs are clear. The visualized skeletal structures are unremarkable. IMPRESSION: No active cardiopulmonary disease. Electronically Signed   By: Marijo Conception M.D.   On: 11/14/2021 21:31    Procedures Procedures    Medications Ordered in ED Medications  clindamycin (CLEOCIN) capsule 300 mg (has no administration in time range)  predniSONE (DELTASONE) tablet 60 mg (has no administration in time range)  morphine (PF) 4 MG/ML injection 4 mg (4 mg Intravenous Given 11/14/21 2143)  ondansetron (ZOFRAN) injection 4 mg (4 mg Intravenous Given 11/14/21 2143)  sodium chloride 0.9 % bolus 1,000 mL (1,000 mLs Intravenous New Bag/Given 11/14/21 2143)  iohexol (OMNIPAQUE) 300 MG/ML solution 75 mL (75 mLs Intravenous Contrast Given 11/14/21 2233)  iohexol (OMNIPAQUE) 300 MG/ML solution 75 mL (75 mLs Intravenous Contrast Given 11/14/21 2234)   ED Course/ Medical Decision Making/ A&P    66 year old here for evaluation of upper chest swelling and mass. Noted 3 weeks of URI sx, thought related to be viral in nature.  Progressive pain and difficulty swallowing with enlarging right supraclavicular mass.  She has a chronic cough as well as some chronic epigastric pain.  She is followed by GI for this had a recent EGD which showed her prior H. pylori infection had resolved.  Does admit to some night sweats however states she thought this was due to menopause.  No intentional weight loss.  Labs and imaging personally viewed and interpreted:  CBC leukocytosis 26.7 Metabolic panel potassium 3.4 Strep negative Chest x-ray that significant abnormality CT neck mild fold thickening possible mild supraglottitis versus laryngitis.  No evidence of epiglottitis CT C/A/P right thyroid nodule  Patient reassessed.  Tolerating p.o. intake,  clear airway.  Discussed results with patient.  She needs close follow-up with ENT.  We will start antibiotics, steroids, return for new or worsening symptoms.  Patient agreeable.  The patient has been appropriately medically screened and/or stabilized in the ED. I have low suspicion for any other emergent medical condition which would require further screening, evaluation or treatment in the ED or require inpatient management.  Patient is hemodynamically stable and in no acute distress.  Patient able to ambulate in department prior to ED.  Evaluation does not show acute pathology that would require ongoing or additional emergent interventions while in the emergency department or further inpatient treatment.  I have discussed the diagnosis with the patient and answered all questions.  Pain is been managed while in the emergency department and patient has no further complaints prior to discharge.  Patient is comfortable with plan  discussed in room and is stable for discharge at this time.  I have discussed strict return precautions for returning to the emergency department.  Patient was encouraged to follow-up with PCP/specialist refer to at discharge.                            Medical Decision Making Amount and/or Complexity of Data Reviewed External Data Reviewed: labs, radiology and notes. Labs: ordered. Decision-making details documented in ED Course. Radiology: ordered and independent interpretation performed. Decision-making details documented in ED Course.  Risk OTC drugs. Prescription drug management. Decision regarding hospitalization. Diagnosis or treatment significantly limited by social determinants of health.         Final Clinical Impression(s) / ED Diagnoses Final diagnoses:  Laryngitis  Thyroid mass    Rx / DC Orders ED Discharge Orders          Ordered    clindamycin (CLEOCIN) 300 MG capsule  3 times daily        11/14/21 2331    predniSONE (DELTASONE) 20 MG  tablet  Daily        11/14/21 2331    lidocaine (XYLOCAINE) 2 % solution  As needed        11/14/21 2331              Yaquelin Langelier A, PA-C 11/14/21 2338    Isla Pence, MD 11/16/21 1001

## 2021-11-14 NOTE — ED Triage Notes (Signed)
Pt c/o sore throat, difficulty to swallow, swollen lymph nodes, cough, and congestion for over 2 wks. States dx with viral URI from her PCP on 7/12. States taking OTC meds with no relief. States feeling worse.

## 2021-11-14 NOTE — ED Triage Notes (Signed)
Patient states she went to urgent care today for evaluation of her sore throat and was sent to ED for evaluation of a mass on the right of her neck. Patient states she has not noticed the mass before it was pointed out to her at urgent care today. Patient is alert, oriented, speaking in complete sentences, and is in no apparent distress at this time.
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
## 2021-11-14 NOTE — ED Notes (Signed)
Patient is being discharged from the Urgent Care and sent to the Emergency Department via POV . Per [name], patient is in need of higher level of care due to mass on neck. Patient is aware and verbalizes understanding of plan of care.  Vitals:   11/14/21 1546  BP: 139/76  Pulse: 86  Resp: 18  Temp: 98 F (36.7 C)  SpO2: 99%

## 2021-11-14 NOTE — Discharge Instructions (Signed)
Advised for you to report to the emergency room for evaluation of the right sided neck mass with difficulty swallowing and neck pain.

## 2021-11-23 ENCOUNTER — Other Ambulatory Visit: Payer: Self-pay | Admitting: Nurse Practitioner

## 2021-11-23 MED ORDER — SAXENDA 18 MG/3ML ~~LOC~~ SOPN: PEN_INJECTOR | SUBCUTANEOUS | 0 refills | Status: DC

## 2021-11-26 ENCOUNTER — Encounter: Payer: Self-pay | Admitting: Nurse Practitioner

## 2021-12-19 ENCOUNTER — Ambulatory Visit (INDEPENDENT_AMBULATORY_CARE_PROVIDER_SITE_OTHER): Payer: No Typology Code available for payment source | Admitting: Nurse Practitioner

## 2021-12-19 ENCOUNTER — Encounter: Payer: Self-pay | Admitting: Internal Medicine

## 2021-12-19 ENCOUNTER — Ambulatory Visit: Payer: No Typology Code available for payment source | Admitting: Nurse Practitioner

## 2021-12-19 ENCOUNTER — Encounter: Payer: Self-pay | Admitting: Nurse Practitioner

## 2021-12-19 ENCOUNTER — Ambulatory Visit (INDEPENDENT_AMBULATORY_CARE_PROVIDER_SITE_OTHER): Payer: No Typology Code available for payment source | Admitting: Internal Medicine

## 2021-12-19 VITALS — BP 130/89 | HR 88 | Temp 96.6°F | Ht 63.0 in | Wt 186.6 lb

## 2021-12-19 VITALS — BP 138/90 | HR 79 | Ht 63.0 in | Wt 186.8 lb

## 2021-12-19 DIAGNOSIS — Z716 Tobacco abuse counseling: Secondary | ICD-10-CM

## 2021-12-19 DIAGNOSIS — F17209 Nicotine dependence, unspecified, with unspecified nicotine-induced disorders: Secondary | ICD-10-CM | POA: Diagnosis not present

## 2021-12-19 DIAGNOSIS — E0590 Thyrotoxicosis, unspecified without thyrotoxic crisis or storm: Secondary | ICD-10-CM | POA: Diagnosis not present

## 2021-12-19 DIAGNOSIS — E079 Disorder of thyroid, unspecified: Secondary | ICD-10-CM | POA: Diagnosis not present

## 2021-12-19 DIAGNOSIS — E559 Vitamin D deficiency, unspecified: Secondary | ICD-10-CM | POA: Diagnosis not present

## 2021-12-19 DIAGNOSIS — Z6833 Body mass index (BMI) 33.0-33.9, adult: Secondary | ICD-10-CM

## 2021-12-19 DIAGNOSIS — E213 Hyperparathyroidism, unspecified: Secondary | ICD-10-CM | POA: Diagnosis not present

## 2021-12-19 DIAGNOSIS — E6609 Other obesity due to excess calories: Secondary | ICD-10-CM

## 2021-12-19 DIAGNOSIS — Z90710 Acquired absence of both cervix and uterus: Secondary | ICD-10-CM | POA: Insufficient documentation

## 2021-12-19 LAB — T4, FREE: Free T4: 0.92 ng/dL (ref 0.60–1.60)

## 2021-12-19 LAB — CALCIUM: Calcium: 10 mg/dL (ref 8.4–10.5)

## 2021-12-19 LAB — VITAMIN D 25 HYDROXY (VIT D DEFICIENCY, FRACTURES): VITD: 25.42 ng/mL — ABNORMAL LOW (ref 30.00–100.00)

## 2021-12-19 LAB — TSH: TSH: 0.34 u[IU]/mL — ABNORMAL LOW (ref 0.35–5.50)

## 2021-12-19 MED ORDER — SAXENDA 18 MG/3ML ~~LOC~~ SOPN: 3.0000 mg | PEN_INJECTOR | Freq: Every day | SUBCUTANEOUS | 0 refills | Status: DC

## 2021-12-19 MED ORDER — BUPROPION HCL ER (SR) 150 MG PO TB12: 150.0000 mg | ORAL_TABLET | Freq: Two times a day (BID) | ORAL | 1 refills | Status: DC

## 2021-12-19 NOTE — Assessment & Plan Note (Signed)
>>  ASSESSMENT AND PLAN FOR OBESITY (BMI 30.0-34.9) WRITTEN ON 12/19/2021  3:25 PM BY Bernice Mullin LUM, NP  Developed ABD pain and nausea after starting saxenda injection. This is because she did not change diet nor decreased meal portions. No constipation, no diarrhea Lost 5lbs in last 42month Current dose of saxenda 3mg  daily Wt Readings from Last 3 Encounters:  12/19/21 186 lb 9.6 oz (84.6 kg)  12/19/21 186 lb 12.8 oz (84.7 kg)  10/31/21 191 lb 12.8 oz (87 kg)      Latest Ref Rng & Units 11/14/2021    7:51 PM 11/14/2021    6:05 PM 05/21/2021    8:47 AM  CMP  Glucose 70 - 99 mg/dL  98    BUN 8 - 23 mg/dL  5    Creatinine 5.46 - 1.00 mg/dL  5.03    Sodium 546 - 568 mmol/L  140    Potassium 3.5 - 5.1 mmol/L  3.4    Chloride 98 - 111 mmol/L  109    CO2 22 - 32 mmol/L  24    Calcium 8.9 - 10.3 mg/dL  9.1  12.7   Total Protein 6.5 - 8.1 g/dL 6.9     Total Bilirubin 0.3 - 1.2 mg/dL 0.5     Alkaline Phos 38 - 126 U/L 94     AST 15 - 41 U/L 16     ALT 0 - 44 U/L 26       Maintain saxenda dose Advised to maintain low fat/low carb/low sugar diet Start daily exercise. F/up in 85month

## 2021-12-19 NOTE — Patient Instructions (Addendum)
Maintain saxenda  and wellbutrin dose  DASH Eating Plan DASH stands for Dietary Approaches to Stop Hypertension. The DASH eating plan is a healthy eating plan that has been shown to: Reduce high blood pressure (hypertension). Reduce your risk for type 2 diabetes, heart disease, and stroke. Help with weight loss. What are tips for following this plan? Reading food labels Check food labels for the amount of salt (sodium) per serving. Choose foods with less than 5 percent of the Daily Value of sodium. Generally, foods with less than 300 milligrams (mg) of sodium per serving fit into this eating plan. To find whole grains, look for the word "whole" as the first word in the ingredient list. Shopping Buy products labeled as "low-sodium" or "no salt added." Buy fresh foods. Avoid canned foods and pre-made or frozen meals. Cooking Avoid adding salt when cooking. Use salt-free seasonings or herbs instead of table salt or sea salt. Check with your health care provider or pharmacist before using salt substitutes. Do not fry foods. Cook foods using healthy methods such as baking, boiling, grilling, roasting, and broiling instead. Cook with heart-healthy oils, such as olive, canola, avocado, soybean, or sunflower oil. Meal planning  Eat a balanced diet that includes: 4 or more servings of fruits and 4 or more servings of vegetables each day. Try to fill one-half of your plate with fruits and vegetables. 6-8 servings of whole grains each day. Less than 6 oz (170 g) of lean meat, poultry, or fish each day. A 3-oz (85-g) serving of meat is about the same size as a deck of cards. One egg equals 1 oz (28 g). 2-3 servings of low-fat dairy each day. One serving is 1 cup (237 mL). 1 serving of nuts, seeds, or beans 5 times each week. 2-3 servings of heart-healthy fats. Healthy fats called omega-3 fatty acids are found in foods such as walnuts, flaxseeds, fortified milks, and eggs. These fats are also found in  cold-water fish, such as sardines, salmon, and mackerel. Limit how much you eat of: Canned or prepackaged foods. Food that is high in trans fat, such as some fried foods. Food that is high in saturated fat, such as fatty meat. Desserts and other sweets, sugary drinks, and other foods with added sugar. Full-fat dairy products. Do not salt foods before eating. Do not eat more than 4 egg yolks a week. Try to eat at least 2 vegetarian meals a week. Eat more home-cooked food and less restaurant, buffet, and fast food. Lifestyle When eating at a restaurant, ask that your food be prepared with less salt or no salt, if possible. If you drink alcohol: Limit how much you use to: 0-1 drink a day for women who are not pregnant. 0-2 drinks a day for men. Be aware of how much alcohol is in your drink. In the [place] one drink equals one 12 oz bottle of beer (355 mL), one 5 oz glass of wine (148 mL), or one 1 oz glass of hard liquor (44 mL). General information Avoid eating more than 2,300 mg of salt a day. If you have hypertension, you may need to reduce your sodium intake to 1,500 mg a day. Work with your health care provider to maintain a healthy body weight or to lose weight. Ask what an ideal weight is for you. Get at least 30 minutes of exercise that causes your heart to beat faster (aerobic exercise) most days of the week. Activities may include walking, swimming, or biking. Work with  your health care provider or dietitian to adjust your eating plan to your individual calorie needs. What foods should I eat? Fruits All fresh, dried, or frozen fruit. Canned fruit in natural juice (without added sugar). Vegetables Fresh or frozen vegetables (raw, steamed, roasted, or grilled). Low-sodium or reduced-sodium tomato and vegetable juice. Low-sodium or reduced-sodium tomato sauce and tomato paste. Low-sodium or reduced-sodium canned vegetables. Grains Whole-grain or whole-wheat bread. Whole-grain or  whole-wheat pasta. Brown rice. [place]. Bulgur. Whole-grain and low-sodium cereals. Pita bread. Low-fat, low-sodium crackers. Whole-wheat flour tortillas. Meats and other proteins Skinless chicken or [place]. Ground chicken or [place]. Pork with fat trimmed off. Fish and seafood. Egg whites. Dried beans, peas, or lentils. Unsalted nuts, nut butters, and seeds. Unsalted canned beans. Lean cuts of beef with fat trimmed off. Low-sodium, lean precooked or cured meat, such as sausages or meat loaves. Dairy Low-fat (1%) or fat-free (skim) milk. Reduced-fat, low-fat, or fat-free cheeses. Nonfat, low-sodium ricotta or cottage cheese. Low-fat or nonfat yogurt. Low-fat, low-sodium cheese. Fats and oils Soft margarine without trans fats. Vegetable oil. Reduced-fat, low-fat, or light mayonnaise and salad dressings (reduced-sodium). Canola, safflower, olive, avocado, soybean, and sunflower oils. Avocado. Seasonings and condiments Herbs. Spices. Seasoning mixes without salt. Other foods Unsalted popcorn and pretzels. Fat-free sweets. The items listed above may not be a complete list of foods and beverages you can eat. Contact a dietitian for more information. What foods should I avoid? Fruits Canned fruit in a light or heavy syrup. Fried fruit. Fruit in cream or butter sauce. Vegetables Creamed or fried vegetables. Vegetables in a cheese sauce. Regular canned vegetables (not low-sodium or reduced-sodium). Regular canned tomato sauce and paste (not low-sodium or reduced-sodium). Regular tomato and vegetable juice (not low-sodium or reduced-sodium). Angie Fava. Olives. Grains Baked goods made with fat, such as croissants, muffins, or some breads. Dry pasta or rice meal packs. Meats and other proteins Fatty cuts of meat. Ribs. Fried meat. Berniece Salines. Bologna, salami, and other precooked or cured meats, such as sausages or meat loaves. Fat from the back of a pig (fatback). Bratwurst. Salted nuts and seeds. Canned  beans with added salt. Canned or smoked fish. Whole eggs or egg yolks. Chicken or [place] with skin. Dairy Whole or 2% milk, cream, and half-and-half. Whole or full-fat cream cheese. Whole-fat or sweetened yogurt. Full-fat cheese. Nondairy creamers. Whipped toppings. Processed cheese and cheese spreads. Fats and oils Butter. Stick margarine. Lard. Shortening. Ghee. Bacon fat. Tropical oils, such as coconut, palm kernel, or palm oil. Seasonings and condiments Onion salt, garlic salt, seasoned salt, table salt, and sea salt. Worcestershire sauce. Tartar sauce. Barbecue sauce. Teriyaki sauce. Soy sauce, including reduced-sodium. Steak sauce. Canned and packaged gravies. Fish sauce. Oyster sauce. Cocktail sauce. Store-bought horseradish. Ketchup. Mustard. Meat flavorings and tenderizers. Bouillon cubes. Hot sauces. Pre-made or packaged marinades. Pre-made or packaged taco seasonings. Relishes. Regular salad dressings. Other foods Salted popcorn and pretzels. The items listed above may not be a complete list of foods and beverages you should avoid. Contact a dietitian for more information. Where to find more information National Heart, Lung, and Blood Institute: https://wilson-eaton.com/ American Heart Association: www.heart.org Academy of Nutrition and Dietetics: www.eatright.[place]: www.kidney.org Summary The DASH eating plan is a healthy eating plan that has been shown to reduce high blood pressure (hypertension). It may also reduce your risk for type 2 diabetes, heart disease, and stroke. When on the DASH eating plan, aim to eat more fresh fruits and vegetables, whole grains, lean proteins, low-fat  dairy, and heart-healthy fats. With the DASH eating plan, you should limit salt (sodium) intake to 2,300 mg a day. If you have hypertension, you may need to reduce your sodium intake to 1,500 mg a day. Work with your health care provider or dietitian to adjust your eating plan to your  individual calorie needs. This information is not intended to replace advice given to you by your health care provider. Make sure you discuss any questions you have with your health care provider. Document Revised: 03/12/2019 Document Reviewed: 03/12/2019 Elsevier Patient Education  [place].

## 2021-12-19 NOTE — Assessment & Plan Note (Addendum)
Developed ABD pain and nausea after starting saxenda injection. This is because she did not change diet nor decreased meal portions. No constipation, no diarrhea Lost 5lbs in last 65monthCurrent dose of saxenda '3mg'$  daily Wt Readings from Last 3 Encounters:  12/19/21 186 lb 9.6 oz (84.6 kg)  12/19/21 186 lb 12.8 oz (84.7 kg)  10/31/21 191 lb 12.8 oz (87 kg)      Latest Ref Rng & Units 11/14/2021    7:51 PM 11/14/2021    6:05 PM 05/21/2021    8:47 AM  CMP  Glucose 70 - 99 mg/dL  98    BUN 8 - 23 mg/dL  5    Creatinine 0.44 - 1.00 mg/dL  0.76    Sodium 135 - 145 mmol/L  140    Potassium 3.5 - 5.1 mmol/L  3.4    Chloride 98 - 111 mmol/L  109    CO2 22 - 32 mmol/L  24    Calcium 8.9 - 10.3 mg/dL  9.1  10.9   Total Protein 6.5 - 8.1 g/dL 6.9     Total Bilirubin 0.3 - 1.2 mg/dL 0.5     Alkaline Phos 38 - 126 U/L 94     AST 15 - 41 U/L 16     ALT 0 - 44 U/L 26       Maintain saxenda dose Advised to maintain low fat/low carb/low sugar diet Start daily exercise. F/up in 126month
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
## 2021-12-19 NOTE — Progress Notes (Signed)
Established Patient Visit  Patient: Elizabeth Best   DOB: May 17, 1955   66 y.o. Female  MRN: [id number] Visit Date: 12/19/2021  Subjective:    Chief Complaint  Patient presents with   Office Visit    Medication refill: saxenda Has been working well for her. No concerns    HPI Tobacco use disorder, continuous Cutting down, now smokes 1/2ppd Take wellbutrin '300mg'$  daily prn. Advised to take wellbutrin '150mg'$  BID and continue to cutting down tobacco use with goal to completely stop.   Obesity (BMI 30.0-34.9) Developed ABD pain and nausea after starting saxenda injection. This is because she did not change diet nor decreased meal portions. No constipation, no diarrhea Lost 5lbs in last 28monthCurrent dose of saxenda '3mg'$  daily Wt Readings from Last 3 Encounters:  12/19/21 186 lb 9.6 oz (84.6 kg)  12/19/21 186 lb 12.8 oz (84.7 kg)  10/31/21 191 lb 12.8 oz (87 kg)      Latest Ref Rng & Units 11/14/2021    7:51 PM 11/14/2021    6:05 PM 05/21/2021    8:47 AM  CMP  Glucose 70 - 99 mg/dL  98    BUN 8 - 23 mg/dL  5    Creatinine 0.44 - 1.00 mg/dL  0.76    Sodium 135 - 145 mmol/L  140    Potassium 3.5 - 5.1 mmol/L  3.4    Chloride 98 - 111 mmol/L  109    CO2 22 - 32 mmol/L  24    Calcium 8.9 - 10.3 mg/dL  9.1  10.9   Total Protein 6.5 - 8.1 g/dL 6.9     Total Bilirubin 0.3 - 1.2 mg/dL 0.5     Alkaline Phos 38 - 126 U/L 94     AST 15 - 41 U/L 16     ALT 0 - 44 U/L 26       Maintain saxenda dose Advised to maintain low fat/low carb/low sugar diet Start daily exercise. F/up in 160monthBP Readings from Last 3 Encounters:  12/19/21 130/89  12/19/21 (!) 138/90  11/14/21 (!) 141/84     Reviewed medical, surgical, and social history today  Medications: Outpatient Medications Prior to Visit  Medication Sig   albuterol (VENTOLIN HFA) 108 (90 Base) MCG/ACT inhaler Inhale 2 puffs into the lungs every 6 (six) hours as needed for wheezing or shortness of breath.    atorvastatin (LIPITOR) 20 MG tablet Take 1 tablet (20 mg total) by mouth daily.   colesevelam (WELCHOL) 625 MG tablet Take 1 tablet (625 mg total) by mouth 2 (two) times daily with a meal.   [DISCONTINUED] buPROPion (WELLBUTRIN SR) 150 MG 12 hr tablet Take 1 tablet (150 mg total) by mouth in the morning for 7 days, THEN 1 tablet (150 mg total) 2 (two) times daily continuously   [DISCONTINUED] Liraglutide -Weight Management (SAXENDA) 18 MG/3ML SOPN Inject 0.6 mg into the skin daily for 7 days, THEN 1.2 mg daily for 7 days, THEN 1.8 mg daily for 7 days, THEN 2.4 mg daily for 7 days, THEN 3 mg daily for 14 days.   No facility-administered medications prior to visit.   Reviewed past medical and social history.   ROS per HPI above      Objective:  BP 130/89 (BP Location: Right Arm, Patient Position: Sitting, Cuff Size: Normal)   Pulse 88   Temp (!) 96.6 F (35.9 C) (Temporal)  Ht '5\' 3"'$  (1.6 m)   Wt 186 lb 9.6 oz (84.6 kg)   SpO2 99%   BMI 33.05 kg/m      Physical Exam Vitals reviewed.  Constitutional:      Appearance: She is obese.  Cardiovascular:     Rate and Rhythm: Normal rate.     Pulses: Normal pulses.  Pulmonary:     Effort: Pulmonary effort is normal.  Abdominal:     General: Bowel sounds are normal.     Palpations: Abdomen is soft.     Tenderness: There is no abdominal tenderness.  Neurological:     Mental Status: She is alert.     No results found for any visits on 12/19/21.    Assessment & Plan:    Problem List Items Addressed This Visit       Other   Obesity (BMI 30.0-34.9) - Primary    Developed ABD pain and nausea after starting saxenda injection. This is because she did not change diet nor decreased meal portions. No constipation, no diarrhea Lost 5lbs in last 75monthCurrent dose of saxenda '3mg'$  daily Wt Readings from Last 3 Encounters:  12/19/21 186 lb 9.6 oz (84.6 kg)  12/19/21 186 lb 12.8 oz (84.7 kg)  10/31/21 191 lb 12.8 oz (87 kg)       Latest Ref Rng & Units 11/14/2021    7:51 PM 11/14/2021    6:05 PM 05/21/2021    8:47 AM  CMP  Glucose 70 - 99 mg/dL  98    BUN 8 - 23 mg/dL  5    Creatinine 0.44 - 1.00 mg/dL  0.76    Sodium 135 - 145 mmol/L  140    Potassium 3.5 - 5.1 mmol/L  3.4    Chloride 98 - 111 mmol/L  109    CO2 22 - 32 mmol/L  24    Calcium 8.9 - 10.3 mg/dL  9.1  10.9   Total Protein 6.5 - 8.1 g/dL 6.9     Total Bilirubin 0.3 - 1.2 mg/dL 0.5     Alkaline Phos 38 - 126 U/L 94     AST 15 - 41 U/L 16     ALT 0 - 44 U/L 26       Maintain saxenda dose Advised to maintain low fat/low carb/low sugar diet Start daily exercise. F/up in 131month    Relevant Medications   Liraglutide -Weight Management (SAXENDA) 18 MG/3ML SOPN   Tobacco use disorder, continuous    Cutting down, now smokes 1/2ppd Take wellbutrin '300mg'$  daily prn. Advised to take wellbutrin '150mg'$  BID and continue to cutting down tobacco use with goal to completely stop.       Relevant Medications   buPROPion (WELLBUTRIN SR) 150 MG 12 hr tablet   Other Visit Diagnoses     Encounter for tobacco use cessation counseling       Relevant Medications   buPROPion (WELLBUTRIN SR) 150 MG 12 hr tablet      Return in about 4 weeks (around 01/16/2022) for Weight management, hyperlipidemia (fasting).     ChWilfred LacyNP

## 2021-12-19 NOTE — Patient Instructions (Addendum)
Stay Hydrated  Avoid over the counter calcium tablet  Maintain 2-3 servings of dietary calcium daily  Continue Vitamin D 1000 iu daily    24-Hour Urine Collection  You will be collecting your urine for a 24-hour period of time. Your timer starts with your first urine of the morning (For example - If you first pee at [place], your timer will start at [place]) [place] away your first urine of the morning Collect your urine every time you pee for the next 24 hours STOP your urine collection 24 hours after you started the collection (For example - You would stop at 9AM the day after you started)

## 2021-12-19 NOTE — Assessment & Plan Note (Signed)
Cutting down, now smokes 1/2ppd Take wellbutrin '300mg'$  daily prn. Advised to take wellbutrin '150mg'$  BID and continue to cutting down tobacco use with goal to completely stop.

## 2021-12-20 ENCOUNTER — Other Ambulatory Visit: Payer: Self-pay | Admitting: Nurse Practitioner

## 2021-12-20 DIAGNOSIS — E6609 Other obesity due to excess calories: Secondary | ICD-10-CM

## 2021-12-21 ENCOUNTER — Other Ambulatory Visit (HOSPITAL_COMMUNITY): Payer: Self-pay

## 2021-12-21 LAB — CALCIUM, IONIZED: Calcium, Ion: 5.2 mg/dL (ref 4.7–5.5)

## 2021-12-21 LAB — PARATHYROID HORMONE, INTACT (NO CA): PTH: 43 pg/mL (ref 16–77)

## 2022-01-03 ENCOUNTER — Other Ambulatory Visit: Payer: Self-pay | Admitting: Nurse Practitioner

## 2022-01-03 DIAGNOSIS — Z6833 Body mass index (BMI) 33.0-33.9, adult: Secondary | ICD-10-CM

## 2022-01-03 DIAGNOSIS — E6609 Other obesity due to excess calories: Secondary | ICD-10-CM

## 2022-01-04 MED ORDER — SAXENDA 18 MG/3ML ~~LOC~~ SOPN: 3.0000 mg | PEN_INJECTOR | Freq: Every day | SUBCUTANEOUS | 0 refills | Status: DC

## 2022-01-17 ENCOUNTER — Ambulatory Visit
Admission: RE | Admit: 2022-01-17 | Discharge: 2022-01-17 | Disposition: A | Discharge status: Home / Self Care | Payer: No Typology Code available for payment source | Source: Ambulatory Visit | Attending: Internal Medicine | Admitting: Internal Medicine

## 2022-01-17 ENCOUNTER — Encounter: Payer: Self-pay | Admitting: Nurse Practitioner

## 2022-01-17 ENCOUNTER — Ambulatory Visit (INDEPENDENT_AMBULATORY_CARE_PROVIDER_SITE_OTHER): Payer: No Typology Code available for payment source | Admitting: Nurse Practitioner

## 2022-01-17 ENCOUNTER — Other Ambulatory Visit: Payer: No Typology Code available for payment source

## 2022-01-17 VITALS — BP 130/88 | HR 70 | Temp 96.3°F | Ht 63.0 in | Wt 186.2 lb

## 2022-01-17 DIAGNOSIS — E7800 Pure hypercholesterolemia, unspecified: Secondary | ICD-10-CM

## 2022-01-17 DIAGNOSIS — E669 Obesity, unspecified: Secondary | ICD-10-CM

## 2022-01-17 DIAGNOSIS — D500 Iron deficiency anemia secondary to blood loss (chronic): Secondary | ICD-10-CM | POA: Diagnosis not present

## 2022-01-17 DIAGNOSIS — E213 Hyperparathyroidism, unspecified: Secondary | ICD-10-CM

## 2022-01-17 DIAGNOSIS — E079 Disorder of thyroid, unspecified: Secondary | ICD-10-CM

## 2022-01-17 LAB — CBC
HCT: 42.2 % (ref 36.0–46.0)
Hemoglobin: 13.8 g/dL (ref 12.0–15.0)
MCHC: 32.8 g/dL (ref 30.0–36.0)
MCV: 78.1 fl (ref 78.0–100.0)
Platelets: 325 10*3/uL (ref 150.0–400.0)
RBC: 5.41 Mil/uL — ABNORMAL HIGH (ref 3.87–5.11)
RDW: 16.9 % — ABNORMAL HIGH (ref 11.5–15.5)
WBC: 8.6 10*3/uL (ref 4.0–10.5)

## 2022-01-17 LAB — LIPID PANEL
Cholesterol: 139 mg/dL (ref 0–200)
HDL: 36 mg/dL — ABNORMAL LOW (ref >39.00)
LDL Cholesterol: 87 mg/dL (ref 0–99)
NonHDL: 103.36
Total CHOL/HDL Ratio: 4
Triglycerides: 83 mg/dL (ref 0.0–149.0)
VLDL: 16.6 mg/dL (ref 0.0–40.0)

## 2022-01-17 LAB — IBC + FERRITIN
Ferritin: 52 ng/mL (ref 10.0–291.0)
Iron: 51 ug/dL (ref 42–145)
Saturation Ratios: 13.9 % — ABNORMAL LOW (ref 20.0–50.0)
TIBC: 368.2 ug/dL (ref 250.0–450.0)
Transferrin: 263 mg/dL (ref 212.0–360.0)

## 2022-01-17 NOTE — Progress Notes (Signed)
Established Patient Visit  Patient: Elizabeth Best   DOB: 03-11-1956   66 y.o. Female  MRN: [id number] Visit Date: 01/17/2022  Subjective:    Chief Complaint  Patient presents with   office visit    Weight management  Needs refill on saxenda  No concerns    HPI Pure hypercholesterolemia Repeat lipid panel No adverse effects with atorvastatin  Iron deficiency anemia Repeat cbc and iron panel  Obesity (BMI 30.0-34.9) Last dose of saxenda 61monthago due medication backorder. No consistent exercise due to work schedule(2jobs). Reports she has decrease meal portions but not sure amount of daily caloric intake. Wt Readings from Last 3 Encounters:  01/17/22 186 lb 3.2 oz (84.5 kg)  12/19/21 186 lb 9.6 oz (84.6 kg)  12/19/21 186 lb 12.8 oz (84.7 kg)   Entered referral to weight and wellness clinic  BP Readings from Last 3 Encounters:  01/17/22 130/88  12/19/21 130/89  12/19/21 (!) 138/90    Wt Readings from Last 3 Encounters:  01/17/22 186 lb 3.2 oz (84.5 kg)  12/19/21 186 lb 9.6 oz (84.6 kg)  12/19/21 186 lb 12.8 oz (84.7 kg)    Reviewed medical, surgical, and social history today  Medications: Outpatient Medications Prior to Visit  Medication Sig   albuterol (VENTOLIN HFA) 108 (90 Base) MCG/ACT inhaler Inhale 2 puffs into the lungs every 6 (six) hours as needed for wheezing or shortness of breath.   atorvastatin (LIPITOR) 20 MG tablet Take 1 tablet (20 mg total) by mouth daily.   buPROPion (WELLBUTRIN SR) 150 MG 12 hr tablet Take 1 tablet (150 mg total) by mouth 2 (two) times daily.   colesevelam (WELCHOL) 625 MG tablet Take 1 tablet (625 mg total) by mouth 2 (two) times daily with a meal. (Patient not taking: Reported on 01/17/2022)   Liraglutide -Weight Management (SAXENDA) 18 MG/3ML SOPN Inject 3 mg into the skin daily. (Patient not taking: Reported on 01/17/2022)   No facility-administered medications prior to visit.   Reviewed past medical and  social history.   ROS per HPI above      Objective:  BP 130/88 (BP Location: Right Arm, Patient Position: Sitting, Cuff Size: Normal)   Pulse 70   Temp (!) 96.3 F (35.7 C) (Temporal)   Ht '5\' 3"'$  (1.6 m)   Wt 186 lb 3.2 oz (84.5 kg)   SpO2 97%   BMI 32.98 kg/m      Physical Exam Vitals reviewed.  Cardiovascular:     Rate and Rhythm: Normal rate.     Pulses: Normal pulses.  Pulmonary:     Effort: Pulmonary effort is normal.  Neurological:     Mental Status: She is alert and oriented to person, place, and time.     Results for orders placed or performed in visit on 01/17/22  Lipid panel  Result Value Ref Range   Cholesterol 139 0 - 200 mg/dL   Triglycerides 83.0 0.0 - 149.0 mg/dL   HDL 36.00 (L) >39.00 mg/dL   VLDL 16.6 0.0 - 40.0 mg/dL   LDL Cholesterol 87 0 - 99 mg/dL   Total CHOL/HDL Ratio 4    NonHDL 103.36   CBC  Result Value Ref Range   WBC 8.6 4.0 - 10.5 K/uL   RBC 5.41 (H) 3.87 - 5.11 Mil/uL   Platelets 325.0 150.0 - 400.0 K/uL   Hemoglobin 13.8 12.0 - 15.0 g/dL   HCT 42.2  36.0 - 46.0 %   MCV 78.1 78.0 - 100.0 fl   MCHC 32.8 30.0 - 36.0 g/dL   RDW 16.9 (H) 11.5 - 15.5 %  IBC + Ferritin  Result Value Ref Range   Iron 51 42 - 145 ug/dL   Transferrin 263.0 212.0 - 360.0 mg/dL   Saturation Ratios 13.9 (L) 20.0 - 50.0 %   Ferritin 52.0 10.0 - 291.0 ng/mL   TIBC 368.2 250.0 - 450.0 mcg/dL      Assessment & Plan:    Problem List Items Addressed This Visit       Other   Iron deficiency anemia    Repeat cbc and iron panel      Relevant Orders   CBC (Completed)   IBC + Ferritin (Completed)   Obesity (BMI 30.0-34.9)    Last dose of saxenda 89monthago due medication backorder. No consistent exercise due to work schedule(2jobs). Reports she has decrease meal portions but not sure amount of daily caloric intake. Wt Readings from Last 3 Encounters:  01/17/22 186 lb 3.2 oz (84.5 kg)  12/19/21 186 lb 9.6 oz (84.6 kg)  12/19/21 186 lb 12.8 oz (84.7  kg)  Entered referral to weight and wellness clinic       Relevant Orders   Amb Ref to Medical Weight Management   Pure hypercholesterolemia - Primary    Repeat lipid panel No adverse effects with atorvastatin      Relevant Orders   Lipid panel (Completed)   Return in about 3 months (around 04/18/2022) for CPE (fasting).     CWilfred Lacy NP

## 2022-01-17 NOTE — Assessment & Plan Note (Signed)
Last dose of saxenda 14monthago due medication backorder. No consistent exercise due to work schedule(2jobs). Reports she has decrease meal portions but not sure amount of daily caloric intake. Wt Readings from Last 3 Encounters:  01/17/22 186 lb 3.2 oz (84.5 kg)  12/19/21 186 lb 9.6 oz (84.6 kg)  12/19/21 186 lb 12.8 oz (84.7 kg)   Entered referral to weight and wellness clinic

## 2022-01-17 NOTE — Assessment & Plan Note (Signed)
>>  ASSESSMENT AND PLAN FOR OBESITY (BMI 30.0-34.9) WRITTEN ON 01/17/2022  1:50 PM BY Kamaree Wheatley LUM, NP  Last dose of saxenda 45month ago due medication backorder. No consistent exercise due to work schedule(2jobs). Reports she has decrease meal portions but not sure amount of daily caloric intake. Wt Readings from Last 3 Encounters:  01/17/22 186 lb 3.2 oz (84.5 kg)  12/19/21 186 lb 9.6 oz (84.6 kg)  12/19/21 186 lb 12.8 oz (84.7 kg)   Entered referral to weight and wellness clinic

## 2022-01-17 NOTE — Patient Instructions (Signed)
Go to lab You will be contacted to schedule appt with healthy weight and wellness clinic

## 2022-01-17 NOTE — Assessment & Plan Note (Signed)
Repeat lipid panel No adverse effects with atorvastatin

## 2022-01-17 NOTE — Assessment & Plan Note (Addendum)
Repeat cbc and iron panel: stable

## 2022-01-17 NOTE — Progress Notes (Unsigned)
Start date:01-16-22 6AM End date:01-17-22 6AM Total volume=750

## 2022-01-18 LAB — CALCIUM, URINE, 24 HOUR: Calcium, 24H Urine: 74 mg/24 h

## 2022-01-18 LAB — CREATININE, URINE, 24 HOUR: Creatinine, 24H Ur: 0.47 g/(24.h) — ABNORMAL LOW (ref 0.50–2.15)

## 2022-01-18 LAB — EXTRA URINE SPECIMEN

## 2022-01-21 ENCOUNTER — Other Ambulatory Visit: Payer: Self-pay | Admitting: Registered Nurse

## 2022-01-21 ENCOUNTER — Other Ambulatory Visit (HOSPITAL_COMMUNITY): Payer: Self-pay

## 2022-01-21 ENCOUNTER — Other Ambulatory Visit: Payer: Self-pay | Admitting: Nurse Practitioner

## 2022-01-21 DIAGNOSIS — J449 Chronic obstructive pulmonary disease, unspecified: Secondary | ICD-10-CM

## 2022-01-21 DIAGNOSIS — E6609 Other obesity due to excess calories: Secondary | ICD-10-CM

## 2022-01-21 MED ORDER — ALBUTEROL SULFATE HFA 108 (90 BASE) MCG/ACT IN AERS
2.0000 | INHALATION_SPRAY | Freq: Four times a day (QID) | RESPIRATORY_TRACT | 6 refills | Status: DC | PRN
  Filled 2022-01-21: qty 6.7, 25d supply, fill #0
  Filled 2022-01-26: qty 6.7, 10d supply, fill #0
  Filled 2022-03-11: qty 6.7, 10d supply, fill #1
  Filled 2022-04-20 – 2022-08-01 (×3): qty 6.7, 10d supply, fill #2
  Filled 2022-08-02 – 2022-08-05 (×2): qty 18, 25d supply, fill #2

## 2022-01-21 MED ORDER — SAXENDA 18 MG/3ML ~~LOC~~ SOPN
3.0000 mg | PEN_INJECTOR | Freq: Every day | SUBCUTANEOUS | 0 refills | Status: DC
  Filled 2022-01-21 – 2022-01-26 (×2): qty 15, 30d supply, fill #0

## 2022-01-22 ENCOUNTER — Encounter: Payer: Self-pay | Admitting: Nurse Practitioner

## 2022-01-23 ENCOUNTER — Other Ambulatory Visit (HOSPITAL_COMMUNITY): Payer: Self-pay

## 2022-01-24 ENCOUNTER — Encounter: Payer: Self-pay | Admitting: Nurse Practitioner

## 2022-01-24 ENCOUNTER — Other Ambulatory Visit (HOSPITAL_COMMUNITY): Payer: Self-pay

## 2022-01-24 ENCOUNTER — Other Ambulatory Visit: Payer: Self-pay | Admitting: Nurse Practitioner

## 2022-01-24 DIAGNOSIS — F17209 Nicotine dependence, unspecified, with unspecified nicotine-induced disorders: Secondary | ICD-10-CM

## 2022-01-24 DIAGNOSIS — Z716 Tobacco abuse counseling: Secondary | ICD-10-CM

## 2022-01-24 MED ORDER — BUPROPION HCL ER (SR) 150 MG PO TB12
150.0000 mg | ORAL_TABLET | ORAL | 5 refills | Status: DC
  Filled 2022-01-24: qty 60, 30d supply, fill #0

## 2022-01-24 MED ORDER — BUPROPION HCL ER (SR) 150 MG PO TB12
150.0000 mg | ORAL_TABLET | Freq: Two times a day (BID) | ORAL | 1 refills | Status: DC
  Filled 2022-01-24 – 2022-01-26 (×2): qty 180, 90d supply, fill #0
  Filled 2022-08-01 – 2022-08-02 (×2): qty 180, 90d supply, fill #1

## 2022-01-25 ENCOUNTER — Other Ambulatory Visit: Payer: Self-pay

## 2022-01-26 ENCOUNTER — Other Ambulatory Visit: Payer: Self-pay | Admitting: Registered Nurse

## 2022-01-26 ENCOUNTER — Other Ambulatory Visit (HOSPITAL_COMMUNITY): Payer: Self-pay

## 2022-01-26 DIAGNOSIS — E7800 Pure hypercholesterolemia, unspecified: Secondary | ICD-10-CM

## 2022-01-28 ENCOUNTER — Other Ambulatory Visit (HOSPITAL_COMMUNITY): Payer: Self-pay

## 2022-01-29 ENCOUNTER — Other Ambulatory Visit: Payer: Self-pay | Admitting: Registered Nurse

## 2022-01-29 ENCOUNTER — Other Ambulatory Visit (HOSPITAL_COMMUNITY): Payer: Self-pay

## 2022-01-29 DIAGNOSIS — E7800 Pure hypercholesterolemia, unspecified: Secondary | ICD-10-CM

## 2022-01-30 ENCOUNTER — Telehealth: Payer: Self-pay

## 2022-01-30 ENCOUNTER — Other Ambulatory Visit (HOSPITAL_COMMUNITY): Payer: Self-pay

## 2022-01-30 NOTE — Telephone Encounter (Signed)
PA for Saxenda 18 mg/ml approved from 01/25/22- 05/17/22.  Pharmacy notified [place] phone. Dm/cma

## 2022-01-31 ENCOUNTER — Other Ambulatory Visit (HOSPITAL_COMMUNITY): Payer: Self-pay

## 2022-01-31 ENCOUNTER — Other Ambulatory Visit: Payer: Self-pay | Admitting: Registered Nurse

## 2022-01-31 ENCOUNTER — Encounter: Payer: Self-pay | Admitting: Nurse Practitioner

## 2022-01-31 DIAGNOSIS — E6609 Other obesity due to excess calories: Secondary | ICD-10-CM

## 2022-01-31 DIAGNOSIS — E7800 Pure hypercholesterolemia, unspecified: Secondary | ICD-10-CM

## 2022-01-31 MED ORDER — ATORVASTATIN CALCIUM 20 MG PO TABS
20.0000 mg | ORAL_TABLET | Freq: Every day | ORAL | 1 refills | Status: DC
  Filled 2022-01-31 – 2022-02-02 (×2): qty 90, 90d supply, fill #0
  Filled 2022-08-01 – 2022-08-02 (×2): qty 90, 90d supply, fill #1

## 2022-02-01 ENCOUNTER — Encounter: Payer: Self-pay | Admitting: Nurse Practitioner

## 2022-02-01 ENCOUNTER — Other Ambulatory Visit (HOSPITAL_COMMUNITY): Payer: Self-pay

## 2022-02-01 MED ORDER — PEN NEEDLES 31G X 6 MM MISC
1.0000 | Freq: Every day | 1 refills | Status: DC
  Filled 2022-02-01 – 2022-02-02 (×2): qty 100, 90d supply, fill #0
  Filled 2022-08-01: qty 100, 90d supply, fill #1

## 2022-02-02 ENCOUNTER — Other Ambulatory Visit (HOSPITAL_COMMUNITY): Payer: Self-pay

## 2022-02-04 ENCOUNTER — Other Ambulatory Visit (HOSPITAL_COMMUNITY): Payer: Self-pay

## 2022-02-19 ENCOUNTER — Encounter (INDEPENDENT_AMBULATORY_CARE_PROVIDER_SITE_OTHER): Payer: Self-pay | Admitting: Family Medicine

## 2022-02-19 ENCOUNTER — Ambulatory Visit (INDEPENDENT_AMBULATORY_CARE_PROVIDER_SITE_OTHER): Payer: No Typology Code available for payment source | Admitting: Family Medicine

## 2022-02-19 VITALS — BP 138/86 | HR 78 | Temp 97.7°F | Ht 63.0 in | Wt 184.0 lb

## 2022-02-19 DIAGNOSIS — Z720 Tobacco use: Secondary | ICD-10-CM

## 2022-02-19 DIAGNOSIS — E669 Obesity, unspecified: Secondary | ICD-10-CM | POA: Diagnosis not present

## 2022-02-19 DIAGNOSIS — F17210 Nicotine dependence, cigarettes, uncomplicated: Secondary | ICD-10-CM

## 2022-02-19 DIAGNOSIS — E7849 Other hyperlipidemia: Secondary | ICD-10-CM

## 2022-02-19 DIAGNOSIS — Z6832 Body mass index (BMI) 32.0-32.9, adult: Secondary | ICD-10-CM

## 2022-02-25 ENCOUNTER — Telehealth: Payer: Self-pay | Admitting: Plastic Surgery

## 2022-02-25 NOTE — Telephone Encounter (Signed)
LVM returning pt call that was left on voicemail today 11/6 at 8:06am.

## 2022-02-28 DIAGNOSIS — E7849 Other hyperlipidemia: Secondary | ICD-10-CM | POA: Insufficient documentation

## 2022-02-28 DIAGNOSIS — Z720 Tobacco use: Secondary | ICD-10-CM | POA: Insufficient documentation

## 2022-02-28 NOTE — Progress Notes (Signed)
Office: [phone]  /  Fax: [phone]   Initial Visit  Elizabeth Best was seen in clinic today to evaluate for obesity. She is interested in losing weight to improve overall health and reduce the risk of weight related complications. She presents today to review program treatment options, initial physical assessment, and evaluation.     She was referred by: Self-Referral  When asked what else they would like to accomplish? She states: Improve existing medical conditions and Improve quality of life  When asked how has your weight affected you? She states: Contributed to medical problems, Having fatigue, and Having poor endurance  Some associated conditions: Hyperlipidemia and Lung disease  Contributing factors: Life event and Other: Mid 50's, more stress, less active.   Weight promoting medications identified: None  Current nutrition plan: Other: eating less.   Current level of physical activity: None  Current or previous pharmacotherapy: None  Response to medication: Never tried medications  Past medical history includes:   Past Medical History:  Diagnosis Date   Anxiety    COPD (chronic obstructive pulmonary disease) ([place])    Depression    Hyperlipidemia    Hypertension    no currently on bp meds 08/16/21   Iron deficiency anemia    Osteopenia    Substance abuse ([place])    History of crack cocaine abuse.   Objective:   BP 138/86   Pulse 78   Temp 97.7 F (36.5 C)   Ht '5\' 3"'$  (1.6 m)   Wt 184 lb (83.5 kg)   SpO2 98%   BMI 32.59 kg/m  She was weighed on the bioimpedance scale: Body mass index is 32.59 kg/m. Visceral Fat Rating:11, Body Fat%:40.3  General:  Alert, oriented and cooperative. Patient is in no acute distress.  Respiratory: Normal respiratory effort, no problems with respiration noted  Extremities: Normal range of motion.    Mental Status: Normal mood and affect. Normal behavior. Normal judgment and thought content.   Assessment and Plan:    Orders Placed This Encounter  Procedures   Comprehensive metabolic panel   Lipid Panel With LDL/HDL Ratio    There are no discontinued medications.   No orders of the defined types were placed in this encounter.    1. Tobacco abuse PCP started Wellbutrin 2-3 months ago.  Patient has been unable to quit.  She has SOB and fatigue which patient attributes to her multiple years of smoking and inactivity.   Briefly counseled on Wellbutrin and how this helps with cravings for food and tobacco and causes weight loss.   2. Other hyperlipidemia She is on Lipitor and Welchol was added to help with hunger.  She has been on these medications for about 2-3 years.  She is tolerating them well.  She has not modified her diet much to help with this medical conditions.   Continue medications per PCP.  Check FLP and CMP in the near future.  Will advise patient in the future how certain foods will affect cholesterol panel.   3. Obesity, current BMI 32.6 Check FLP, CMP in the future due to HLD.  We reviewed weight, biometrics, associated medical conditions and contributing factors with patient. She would benefit from weight loss therapy via a modified calorie, low-carb, high-protein nutritional plan tailored to their REE (resting energy expenditure) which will be determined by indirect calorimetry.  We will also assess for cardiometabolic risk and nutritional derangements via fasting serologies at her next appointment.     Obesity Treatment / Action Plan:  Will work on eliminating or reducing the presence of highly palatable, calorie dense foods in the home. Will complete provided nutritional and psychosocial assessment questionnaire before the next appointment. Will be scheduled for indirect calorimetry to determine resting energy expenditure in a fasting state.  This will allow [place] to create a reduced calorie, high-protein meal plan to promote loss of fat mass while preserving muscle mass. Was  counseled on nutritional approaches to weight loss and benefits of complex carbs and high quality protein as part of nutritional weight management. Was counseled on pharmacotherapy and role as an adjunct in weight management.  Counseled on the health benefits of losing 5%-15% of total body weight.   Obesity Education Performed Today:  She was weighed on the bioimpedance scale and results were discussed and documented in the synopsis.  We discussed obesity as a disease and the importance of a more detailed evaluation of all the factors contributing to the disease.  We discussed the importance of long term lifestyle changes which include nutrition, exercise and behavioral modifications as well as the importance of customizing this to her specific health and social needs.  We discussed the benefits of reaching a healthier weight to alleviate the symptoms of existing conditions and reduce the risks of the biomechanical, metabolic and psychological effects of obesity.  Elizabeth Best appears to be in the action stage of change and states they are ready to start intensive lifestyle modifications and behavioral modifications.  40 minutes was spent today on this visit including the above counseling, pre-visit chart review, and post-visit documentation.  Reviewed by clinician on day of visit: allergies, medications, problem list, medical history, surgical history, family history, social history, and previous encounter notes.  I, Davy Pique, RMA, am acting as Location manager for [employer], DO.  I have reviewed the above documentation for accuracy and completeness, and I agree with the above. Marjory Sneddon, D.O.  The [place] was signed into law in 2016 which includes the topic of electronic health records.  This provides immediate access to information in MyChart.  This includes consultation notes, operative notes, office notes, lab results and pathology reports.  If you  have any questions about what you read please let us know at your next visit so we can discuss your concerns and take corrective action if need be.  We are right here with you.

## 2022-03-11 ENCOUNTER — Other Ambulatory Visit: Payer: Self-pay | Admitting: Nurse Practitioner

## 2022-03-11 ENCOUNTER — Other Ambulatory Visit (HOSPITAL_COMMUNITY): Payer: Self-pay

## 2022-03-11 DIAGNOSIS — Z6833 Body mass index (BMI) 33.0-33.9, adult: Secondary | ICD-10-CM

## 2022-03-12 ENCOUNTER — Other Ambulatory Visit (HOSPITAL_COMMUNITY): Payer: Self-pay

## 2022-03-13 ENCOUNTER — Other Ambulatory Visit: Payer: Self-pay | Admitting: Nurse Practitioner

## 2022-03-13 ENCOUNTER — Other Ambulatory Visit (HOSPITAL_COMMUNITY): Payer: Self-pay

## 2022-03-13 DIAGNOSIS — E6609 Other obesity due to excess calories: Secondary | ICD-10-CM

## 2022-03-19 ENCOUNTER — Other Ambulatory Visit: Payer: Self-pay | Admitting: Nurse Practitioner

## 2022-03-19 ENCOUNTER — Other Ambulatory Visit (HOSPITAL_COMMUNITY): Payer: Self-pay

## 2022-03-19 ENCOUNTER — Encounter: Payer: Self-pay | Admitting: Nurse Practitioner

## 2022-03-19 DIAGNOSIS — Z6833 Body mass index (BMI) 33.0-33.9, adult: Secondary | ICD-10-CM

## 2022-03-19 MED ORDER — SAXENDA 18 MG/3ML ~~LOC~~ SOPN
3.0000 mg | PEN_INJECTOR | Freq: Every day | SUBCUTANEOUS | 0 refills | Status: DC
  Filled 2022-03-19: qty 15, 30d supply, fill #0

## 2022-03-29 ENCOUNTER — Other Ambulatory Visit (HOSPITAL_COMMUNITY): Payer: Self-pay

## 2022-04-01 ENCOUNTER — Encounter: Payer: Self-pay | Admitting: Hematology

## 2022-04-06 ENCOUNTER — Encounter: Payer: Self-pay | Admitting: Hematology

## 2022-04-24 ENCOUNTER — Other Ambulatory Visit (HOSPITAL_COMMUNITY): Payer: Self-pay

## 2022-04-24 ENCOUNTER — Encounter: Payer: Self-pay | Admitting: Hematology

## 2022-05-07 ENCOUNTER — Other Ambulatory Visit (HOSPITAL_COMMUNITY): Payer: Self-pay

## 2022-05-08 ENCOUNTER — Institutional Professional Consult (permissible substitution): Payer: No Typology Code available for payment source | Admitting: Plastic Surgery

## 2022-06-21 ENCOUNTER — Ambulatory Visit: Payer: No Typology Code available for payment source | Admitting: Internal Medicine

## 2022-08-01 ENCOUNTER — Encounter: Payer: Self-pay | Admitting: Hematology

## 2022-08-01 ENCOUNTER — Other Ambulatory Visit: Payer: Self-pay | Admitting: Nurse Practitioner

## 2022-08-01 DIAGNOSIS — E6609 Other obesity due to excess calories: Secondary | ICD-10-CM

## 2022-08-01 DIAGNOSIS — Z1231 Encounter for screening mammogram for malignant neoplasm of breast: Secondary | ICD-10-CM

## 2022-08-02 ENCOUNTER — Other Ambulatory Visit (HOSPITAL_COMMUNITY): Payer: Self-pay

## 2022-08-02 ENCOUNTER — Ambulatory Visit
Admission: RE | Admit: 2022-08-02 | Discharge: 2022-08-02 | Disposition: A | Discharge status: Home / Self Care | Payer: Medicaid Other | Source: Ambulatory Visit | Attending: Nurse Practitioner | Admitting: Nurse Practitioner

## 2022-08-02 ENCOUNTER — Other Ambulatory Visit: Payer: Self-pay | Admitting: Nurse Practitioner

## 2022-08-02 ENCOUNTER — Encounter: Payer: Self-pay | Admitting: Hematology

## 2022-08-02 ENCOUNTER — Other Ambulatory Visit: Payer: Self-pay

## 2022-08-02 DIAGNOSIS — Z1231 Encounter for screening mammogram for malignant neoplasm of breast: Secondary | ICD-10-CM

## 2022-08-02 DIAGNOSIS — E6609 Other obesity due to excess calories: Secondary | ICD-10-CM

## 2022-08-02 MED ORDER — PEN NEEDLES 31G X 6 MM MISC
1.0000 | Freq: Every day | 1 refills | Status: DC
Start: 2022-08-02 — End: 2022-08-05
  Filled 2022-08-02: qty 100, 90d supply, fill #0

## 2022-08-03 ENCOUNTER — Other Ambulatory Visit (HOSPITAL_COMMUNITY): Payer: Self-pay

## 2022-08-05 ENCOUNTER — Encounter: Payer: Self-pay | Admitting: Nurse Practitioner

## 2022-08-05 ENCOUNTER — Other Ambulatory Visit (HOSPITAL_COMMUNITY): Payer: Self-pay

## 2022-08-05 ENCOUNTER — Ambulatory Visit: Payer: Medicaid Other | Admitting: Nurse Practitioner

## 2022-08-05 ENCOUNTER — Ambulatory Visit (INDEPENDENT_AMBULATORY_CARE_PROVIDER_SITE_OTHER): Payer: Medicaid Other | Admitting: Nurse Practitioner

## 2022-08-05 VITALS — BP 108/74 | HR 72 | Temp 97.4°F | Resp 16 | Ht 63.0 in | Wt 193.6 lb

## 2022-08-05 DIAGNOSIS — F331 Major depressive disorder, recurrent, moderate: Secondary | ICD-10-CM

## 2022-08-05 DIAGNOSIS — F411 Generalized anxiety disorder: Secondary | ICD-10-CM

## 2022-08-05 DIAGNOSIS — E213 Hyperparathyroidism, unspecified: Secondary | ICD-10-CM

## 2022-08-05 DIAGNOSIS — E7800 Pure hypercholesterolemia, unspecified: Secondary | ICD-10-CM | POA: Diagnosis not present

## 2022-08-05 DIAGNOSIS — K581 Irritable bowel syndrome with constipation: Secondary | ICD-10-CM | POA: Insufficient documentation

## 2022-08-05 DIAGNOSIS — D500 Iron deficiency anemia secondary to blood loss (chronic): Secondary | ICD-10-CM

## 2022-08-05 DIAGNOSIS — I10 Essential (primary) hypertension: Secondary | ICD-10-CM | POA: Diagnosis not present

## 2022-08-05 DIAGNOSIS — E559 Vitamin D deficiency, unspecified: Secondary | ICD-10-CM

## 2022-08-05 DIAGNOSIS — Z6834 Body mass index (BMI) 34.0-34.9, adult: Secondary | ICD-10-CM

## 2022-08-05 DIAGNOSIS — E6609 Other obesity due to excess calories: Secondary | ICD-10-CM

## 2022-08-05 DIAGNOSIS — F32A Depression, unspecified: Secondary | ICD-10-CM | POA: Insufficient documentation

## 2022-08-05 DIAGNOSIS — F17209 Nicotine dependence, unspecified, with unspecified nicotine-induced disorders: Secondary | ICD-10-CM

## 2022-08-05 LAB — IBC + FERRITIN
Ferritin: 36 ng/mL (ref 10.0–291.0)
Iron: 60 ug/dL (ref 42–145)
Saturation Ratios: 14.8 % — ABNORMAL LOW (ref 20.0–50.0)
TIBC: 404.6 ug/dL (ref 250.0–450.0)
Transferrin: 289 mg/dL (ref 212.0–360.0)

## 2022-08-05 LAB — COMPREHENSIVE METABOLIC PANEL
ALT: 20 U/L (ref 0–35)
AST: 13 U/L (ref 0–37)
Albumin: 4.2 g/dL (ref 3.5–5.2)
Alkaline Phosphatase: 98 U/L (ref 39–117)
BUN: 9 mg/dL (ref 6–23)
CO2: 25 mEq/L (ref 19–32)
Calcium: 9.5 mg/dL (ref 8.4–10.5)
Chloride: 106 mEq/L (ref 96–112)
Creatinine, Ser: 0.7 mg/dL (ref 0.40–1.20)
GFR: 91.43 mL/min (ref >60.00)
Glucose, Bld: 71 mg/dL (ref 70–99)
Potassium: 4.2 mEq/L (ref 3.5–5.1)
Sodium: 142 mEq/L (ref 135–145)
Total Bilirubin: 0.3 mg/dL (ref 0.2–1.2)
Total Protein: 7.1 g/dL (ref 6.0–8.3)

## 2022-08-05 LAB — CBC
HCT: 43.6 % (ref 36.0–46.0)
Hemoglobin: 14.1 g/dL (ref 12.0–15.0)
MCHC: 32.4 g/dL (ref 30.0–36.0)
MCV: 77.4 fl — ABNORMAL LOW (ref 78.0–100.0)
Platelets: 388 10*3/uL (ref 150.0–400.0)
RBC: 5.63 Mil/uL — ABNORMAL HIGH (ref 3.87–5.11)
RDW: 16.5 % — ABNORMAL HIGH (ref 11.5–15.5)
WBC: 10.4 10*3/uL (ref 4.0–10.5)

## 2022-08-05 LAB — LIPID PANEL
Cholesterol: 229 mg/dL — ABNORMAL HIGH (ref 0–200)
HDL: 33.9 mg/dL — ABNORMAL LOW (ref >39.00)
LDL Cholesterol: 157 mg/dL — ABNORMAL HIGH (ref 0–99)
NonHDL: 194.68
Total CHOL/HDL Ratio: 7
Triglycerides: 189 mg/dL — ABNORMAL HIGH (ref 0.0–149.0)
VLDL: 37.8 mg/dL (ref 0.0–40.0)

## 2022-08-05 LAB — VITAMIN D 25 HYDROXY (VIT D DEFICIENCY, FRACTURES): VITD: 18.16 ng/mL — ABNORMAL LOW (ref 30.00–100.00)

## 2022-08-05 MED ORDER — VITAMIN D (ERGOCALCIFEROL) 1.25 MG (50000 UNIT) PO CAPS
50000.0000 [IU] | ORAL_CAPSULE | ORAL | 0 refills | Status: DC
Start: 2022-08-05 — End: 2023-06-25
  Filled 2022-08-05: qty 12, 84d supply, fill #0

## 2022-08-05 MED ORDER — HYDROXYZINE HCL 10 MG PO TABS
10.0000 mg | ORAL_TABLET | Freq: Three times a day (TID) | ORAL | 0 refills | Status: DC | PRN
Start: 2022-08-05 — End: 2022-10-03
  Filled 2022-08-05: qty 30, 10d supply, fill #0

## 2022-08-05 MED ORDER — VENLAFAXINE HCL ER 37.5 MG PO CP24
37.5000 mg | ORAL_CAPSULE | Freq: Every day | ORAL | 5 refills | Status: DC
Start: 2022-08-05 — End: 2022-10-03
  Filled 2022-08-05: qty 30, 30d supply, fill #0

## 2022-08-05 MED ORDER — ATORVASTATIN CALCIUM 20 MG PO TABS
20.0000 mg | ORAL_TABLET | Freq: Every day | ORAL | 1 refills | Status: DC
Start: 2022-08-05 — End: 2022-10-03
  Filled 2022-08-05 (×2): qty 90, 90d supply, fill #0

## 2022-08-05 NOTE — Assessment & Plan Note (Signed)
BP at goal with diet No med needed at this time BP Readings from Last 3 Encounters:  08/05/22 108/74  02/19/22 138/86  01/17/22 130/88

## 2022-08-05 NOTE — Patient Instructions (Signed)
Go to lab Continue Heart healthy diet and daily exercise.  Managing Anxiety, Adult After being diagnosed with anxiety, you may be relieved to know why you have felt or behaved a certain way. You may also feel overwhelmed about the treatment ahead and what it will mean for your life. With care and support, you can manage your anxiety. How to manage lifestyle changes Understanding the difference between stress and anxiety Although stress can play a role in anxiety, it is not the same as anxiety. Stress is your body's reaction to life changes and events, both good and bad. Stress is often caused by something external, such as a deadline, test, or competition. It normally goes away after the event has ended and will last just a few hours. But, stress can be ongoing and can lead to more than just stress. Anxiety is caused by something internal, such as imagining a terrible outcome or worrying that something will go wrong that will greatly upset you. Anxiety often does not go away even after the event is over, and it can become a long-term (chronic) worry. Lowering stress and anxiety Talk with your health care provider or a counselor to learn more about lowering anxiety and stress. They may suggest tension-reduction techniques, such as: Music. Spend time creating or listening to music that you enjoy and that inspires you. Mindfulness-based meditation. Practice being aware of your normal breaths while not trying to control your breathing. It can be done while sitting or walking. Centering prayer. Focus on a word, phrase, or sacred image that means something to you and brings you peace. Deep breathing. Expand your stomach and inhale slowly through your nose. Hold your breath for 3-5 seconds. Then breathe out slowly, letting your stomach muscles relax. Self-talk. Learn to notice and spot thought patterns that lead to anxiety reactions. Change those patterns to thoughts that feel peaceful. Muscle relaxation.  Take time to tense muscles and then relax them. Choose a tension-reduction technique that fits your lifestyle and personality. These techniques take time and practice. Set aside 5-15 minutes a day to do them. Specialized therapists can offer counseling and training in these techniques. The training to help with anxiety may be covered by some insurance plans. Other things you can do to manage stress and anxiety include: Keeping a stress diary. This can help you learn what triggers your reaction and then learn ways to manage your response. Thinking about how you react to certain situations. You may not be able to control everything, but you can control your response. Making time for activities that help you relax and not feeling guilty about spending your time in this way. Doing visual imagery. This involves imagining or creating mental pictures to help you relax. Practicing yoga. Through yoga poses, you can lower tension and relax.  Medicines Medicines for anxiety include: Antidepressant medicines. These are usually prescribed for long-term daily control. Anti-anxiety medicines. These may be added in severe cases, especially when panic attacks occur. When used together, medicines, psychotherapy, and tension-reduction techniques may be the most effective treatment. Relationships Relationships can play a big part in helping you recover. Spend more time connecting with trusted friends and family members. Think about going to couples counseling if you have a partner, taking family education classes, or going to family therapy. Therapy can help you and others better understand your anxiety. How to recognize changes in your anxiety Everyone responds differently to treatment for anxiety. Recovery from anxiety happens when symptoms lessen and stop interfering with your  daily life at home or work. This may mean that you will start to: Have better concentration and focus. Worry will interfere less in your  daily thinking. Sleep better. Be less irritable. Have more energy. Have improved memory. Try to recognize when your condition is getting worse. Contact your provider if your symptoms interfere with home or work and you feel like your condition is not improving. Follow these instructions at home: Activity Exercise. Adults should: Exercise for at least 150 minutes each week. The exercise should increase your heart rate and make you sweat (moderate-intensity exercise). Do strengthening exercises at least twice a week. Get the right amount and quality of sleep. Most adults need 7-9 hours of sleep each night. Lifestyle  Eat a healthy diet that includes plenty of vegetables, fruits, whole grains, low-fat dairy products, and lean protein. Do not eat a lot of foods that are high in fats, added sugars, or salt (sodium). Make choices that simplify your life. Do not use any products that contain nicotine or tobacco. These products include cigarettes, chewing tobacco, and vaping devices, such as e-cigarettes. If you need help quitting, ask your provider. Avoid caffeine, alcohol, and certain over-the-counter cold medicines. These may make you feel worse. Ask your pharmacist which medicines to avoid. General instructions Take over-the-counter and prescription medicines only as told by your provider. Keep all follow-up visits. This is to make sure you are managing your anxiety well or if you need more support. Where to find support You can get help and support from: Self-help groups. Online and [employer]. A trusted spiritual leader. Couples counseling. Family education classes. Family therapy. Where to find more information You may find that joining a support group helps you deal with your anxiety. The following sources can help you find counselors or support groups near you: Mental Health America: mentalhealthamerica.net Anxiety and Depression Association of [place] (ADAA):  adaa.org The [employer] on Mental Illness (NAMI): nami.org Contact a health care provider if: You have a hard time staying focused or finishing tasks. You spend many hours a day feeling worried about everyday life. You are very tired because you cannot stop worrying. You start to have headaches or often feel tense. You have chronic nausea or diarrhea. Get help right away if: Your heart feels like it is racing. You have shortness of breath. You have thoughts of hurting yourself or others. Get help right away if you feel like you may hurt yourself or others, or have thoughts about taking your own life. Go to your nearest emergency room or: Call 911. Call the National Suicide Prevention Lifeline at 770-379-2848 or 988. This is open 24 hours a day. Text the Crisis Text Line at 701-757-9987. This information is not intended to replace advice given to you by your health care provider. Make sure you discuss any questions you have with your health care provider. Document Revised: 01/15/2022 Document Reviewed: 07/30/2020 Elsevier Patient Education  2023 [employer].

## 2022-08-05 NOTE — Assessment & Plan Note (Addendum)
Increased tobacco use due to increased financial stress. Smokes 1/2ppd Did not continue wellbutrin in past as prescribed. Did not f/up due  to lack of insurance. Unable to wuit with wellbutrin dose 150mg  BID x 50months Advised about importance to quit tobacco use.

## 2022-08-05 NOTE — Assessment & Plan Note (Signed)
Advised about importance of heart healthy diet and exercise 

## 2022-08-05 NOTE — Progress Notes (Signed)
Abnormal: Low vit. D: prescription sent. Switch to OTC dose 1000IU daily once prescription is completed, Need of sunlight exposure daily. Stable iron and cbc panel Normal CMP Abnormal lipid panel: resume atorvastatin 20mg  in PM. Maintain heart healthy diet and daily exercise. Pending PTH

## 2022-08-05 NOTE — Addendum Note (Signed)
Addended by: Michaela Corner on: 08/05/2022 02:43 PM   Modules accepted: Orders

## 2022-08-05 NOTE — Assessment & Plan Note (Signed)
Previous use of trazodone: unable to tolerate due to daytime somnolence. Previous use of sertraline, effexor and prozac: unable to remember why medication was discontinued.  She agreed to resume effexor and psychology referral F/up in 88month

## 2022-08-05 NOTE — Assessment & Plan Note (Signed)
Increased panic attack with driving on highway. 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  Sent atarax rx and entered psychology referral

## 2022-08-05 NOTE — Assessment & Plan Note (Signed)
Repeat PTH, calcium and vit. D level today

## 2022-08-05 NOTE — Assessment & Plan Note (Addendum)
S/p hysterectomy Last colonoscopy 2022: colon polyps, non bleeding AVM-clips placed, Pathology: COLON, SIGMOID, POLYPECTOMY-Tubular adenoma. Negative for high grade dysplasia. Repeat in 7years Upper endoscopy 2023: AVM and polyp. Pathology: duodenal polyp, polyp (1)-TUBULAR ADENOMA NEGATIVE FOR HIGH-GRADE DYSPLASIA AND CARCINOMA. Gastric antrum-REACTIVE GASTROPATHY WITH CHRONIC GASTRITIS AND INTESTINAL METAPLASIA HELICOBACTER STAIN NEGATIVE (I[facility], ADEQUATE CONTROL), NEGATIVE FOR DYSPLASIA AND CARCINOMA Gastric body-MILD CHRONIC GASTRITIS WITH LYMPHOID AGGREGATE, HELICOBACTER STAIN NEGATIVE ([facility], ADEQUATE CONTROL), NEGATIVE FOR INTESTINAL METAPLASIA, DYSPLASIA AND CARCINOMA.  Repeat iron and cbc panel today

## 2022-08-05 NOTE — Progress Notes (Addendum)
Established Patient Visit  Patient: Elizabeth Best   DOB: 10/10/1955   67 y.o. Female  MRN: [id number] Visit Date: 08/05/2022  Subjective:    Chief Complaint  Patient presents with   Medication Refill    Refill of Hydroxyzine 20 mg    Medical Management of Chronic Issues    Fasting- Yes-  Cholesterol, BP and DM check   HPI Gap in care for last 7months due to lack of Insurance.  Hyperparathyroidism Repeat PTH, calcium and vit. D level today  Essential hypertension BP at goal with diet No med needed at this time BP Readings from Last 3 Encounters:  08/05/22 108/74  02/19/22 138/86  01/17/22 130/88     GAD (generalized anxiety disorder) Increased panic attack with driving on highway. 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  Sent atarax rx and entered psychology referral  Depression Previous use of trazodone: unable to tolerate due to daytime somnolence. Previous use of sertraline, effexor and prozac: unable to remember why medication was discontinued.  She agreed to resume effexor and psychology referral F/up in 43month  Obesity Advised about importance of heart healthy diet and exercise  Iron deficiency anemia S/p hysterectomy Last colonoscopy 2022: colon polyps, non bleeding AVM-clips placed, Pathology: COLON, SIGMOID, POLYPECTOMY-Tubular adenoma. Negative for high grade dysplasia. Repeat in 7years Upper endoscopy 2023: AVM and polyp. Pathology: duodenal polyp, polyp (1)-TUBULAR ADENOMA NEGATIVE FOR HIGH-GRADE DYSPLASIA AND CARCINOMA. Gastric antrum-REACTIVE GASTROPATHY WITH CHRONIC GASTRITIS AND INTESTINAL METAPLASIA HELICOBACTER STAIN NEGATIVE (IHC, ADEQUATE CONTROL), NEGATIVE FOR DYSPLASIA AND CARCINOMA Gastric body-MILD CHRONIC GASTRITIS WITH LYMPHOID AGGREGATE, HELICOBACTER STAIN NEGATIVE (I[facility], ADEQUATE CONTROL), NEGATIVE FOR INTESTINAL METAPLASIA, DYSPLASIA AND CARCINOMA.  Repeat iron and cbc panel  today  Tobacco use disorder, continuous Increased tobacco use due to increased financial stress. Smokes 1/2ppd Did not continue wellbutrin in past as prescribed. Did not f/up due  to lack of insurance. Unable to wuit with wellbutrin dose  BID x 2months Advised about importance to quit tobacco use. Reviewed medical, surgical, and social history today  Medications: Outpatient Medications Prior to Visit  Medication Sig   albuterol (VENTOLIN HFA) 108 (90 Base) MCG/ACT inhaler Inhale 2 puffs into the lungs every 6 (six) hours as needed for wheezing or shortness of breath.   colesevelam (WELCHOL) 625 MG tablet Take 1 tablet (625 mg total) by mouth 2 (two) times daily with a meal.   [DISCONTINUED] atorvastatin (LIPITOR) 20 MG tablet Take 1 tablet (20 mg total) by mouth daily.   [DISCONTINUED] buPROPion (WELLBUTRIN SR) 150 MG 12 hr tablet Take 1 tablet (150 mg total) by mouth 2 (two) times daily.   [DISCONTINUED] hydrOXYzine (ATARAX) 10 MG tablet Take 10 mg by mouth 3 (three) times daily as needed.   [DISCONTINUED] Insulin Pen Needle (PEN NEEDLES) 31G X 6 MM MISC Use as directed with saxenda   [DISCONTINUED] Liraglutide -Weight Management (SAXENDA) 18 MG/3ML SOPN Inject 3 mg into the skin daily. Need office visit with pcp for additional refill   No facility-administered medications prior to visit.   Reviewed past medical and social history.   ROS per HPI above      Objective:  BP 108/74 (BP Location: Right Arm, Patient Position: Sitting, Cuff Size: Large)   Pulse 72   Temp (!) 97.4 F (36.3 C) (Temporal)   Resp 16   Ht  (1.6 m)   Wt 193 lb 9.6  oz (87.8 kg)   SpO2 97%   BMI 34.29 kg/m      Physical Exam Vitals reviewed.  Cardiovascular:     Rate and Rhythm: Normal rate and regular rhythm.     Pulses: Normal pulses.     Heart sounds: Normal heart sounds.  Pulmonary:     Effort: Pulmonary effort is normal.     Breath sounds: Normal breath sounds.  Neurological:      Mental Status: She is alert and oriented to person, place, and time.     Results for orders placed or performed in visit on 08/05/22  VITAMIN D 25 Hydroxy (Vit-D Deficiency, Fractures)  Result Value Ref Range   VITD 18.16 (L) 30.00 - 100.00 ng/mL  IBC + Ferritin  Result Value Ref Range   Iron 60 42 - 145 ug/dL   Transferrin 295.2 841.3 - 360.0 mg/dL   Saturation Ratios 24.4 (L) 20.0 - 50.0 %   Ferritin 36.0 10.0 - 291.0 ng/mL   TIBC 404.6 250.0 - 450.0 mcg/dL  CBC  Result Value Ref Range   WBC 10.4 4.0 - 10.5 K/uL   RBC 5.63 (H) 3.87 - 5.11 Mil/uL   Platelets 388.0 150.0 - 400.0 K/uL   Hemoglobin 14.1 12.0 - 15.0 g/dL   HCT 01.0 27.2 - 53.6 %   MCV 77.4 (L) 78.0 - 100.0 fl   MCHC 32.4 30.0 - 36.0 g/dL   RDW 64.4 (H) 03.4 - 74.2 %  Lipid panel  Result Value Ref Range   Cholesterol 229 (H) 0 - 200 mg/dL   Triglycerides 595.6 (H) 0.0 - 149.0 mg/dL   HDL 38.75 (L) >64.33 mg/dL   VLDL 29.5 0.0 - 18.8 mg/dL   LDL Cholesterol 416 (H) 0 - 99 mg/dL   Total CHOL/HDL Ratio 7    NonHDL 194.68   Comprehensive metabolic panel  Result Value Ref Range   Sodium 142 135 - 145 mEq/L   Potassium 4.2 3.5 - 5.1 mEq/L   Chloride 106 96 - 112 mEq/L   CO2 25 19 - 32 mEq/L   Glucose, Bld 71 70 - 99 mg/dL   BUN 9 6 - 23 mg/dL   Creatinine, Ser 6.06 0.40 - 1.20 mg/dL   Total Bilirubin 0.3 0.2 - 1.2 mg/dL   Alkaline Phosphatase 98 39 - 117 U/L   AST 13 0 - 37 U/L   ALT 20 0 - 35 U/L   Total Protein 7.1 6.0 - 8.3 g/dL   Albumin 4.2 3.5 - 5.2 g/dL   GFR 30.16 >01.09 mL/min   Calcium 9.5 8.4 - 10.5 mg/dL      Assessment & Plan:    Problem List Items Addressed This Visit       Cardiovascular and Mediastinum   Essential hypertension    BP at goal with diet No med needed at this time BP Readings from Last 3 Encounters:  08/05/22 108/74  02/19/22 138/86  01/17/22 130/88         Relevant Medications   atorvastatin (LIPITOR) 20 MG tablet     Endocrine   Hyperparathyroidism     Repeat PTH, calcium and vit. D level today      Relevant Medications   atorvastatin (LIPITOR) 20 MG tablet   Other Relevant Orders   PTH, intact (no Ca)   VITAMIN D 25 Hydroxy (Vit-D Deficiency, Fractures) (Completed)   Comprehensive metabolic panel (Completed)     Other   Depression - Primary    Previous use of trazodone:  unable to tolerate due to daytime somnolence. Previous use of sertraline, effexor and prozac: unable to remember why medication was discontinued.  She agreed to resume effexor and psychology referral F/up in 48month      Relevant Medications   hydrOXYzine (ATARAX) 10 MG tablet   venlafaxine XR (EFFEXOR XR) 37.5 MG 24 hr capsule   Other Relevant Orders   Ambulatory referral to Psychology   GAD (generalized anxiety disorder)    Increased panic attack with driving on highway. 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  Sent atarax rx and entered psychology referral      Relevant Medications   hydrOXYzine (ATARAX) 10 MG tablet   venlafaxine XR (EFFEXOR XR) 37.5 MG 24 hr capsule   Iron deficiency anemia    S/p hysterectomy Last colonoscopy 2022: colon polyps, non bleeding AVM-clips placed, Pathology: COLON, SIGMOID, POLYPECTOMY-Tubular adenoma. Negative for high grade dysplasia. Repeat in 7years Upper endoscopy 2023: AVM and polyp. Pathology: duodenal polyp, polyp (1)-TUBULAR ADENOMA NEGATIVE FOR HIGH-GRADE DYSPLASIA AND CARCINOMA. Gastric antrum-REACTIVE GASTROPATHY WITH CHRONIC GASTRITIS AND INTESTINAL METAPLASIA HELICOBACTER STAIN NEGATIVE (IHC, ADEQUATE CONTROL), NEGATIVE FOR DYSPLASIA AND CARCINOMA Gastric body-MILD CHRONIC GASTRITIS WITH LYMPHOID AGGREGATE, HELICOBACTER STAIN NEGATIVE (IHC, ADEQUATE CONTROL), NEGATIVE FOR INTESTINAL METAPLASIA, DYSPLASIA AND CARCINOMA.  Repeat iron and cbc panel today      Relevant Orders   IBC + Ferritin (Completed)   CBC (Completed)   Obesity    Advised about importance of heart healthy  diet and exercise      Pure hypercholesterolemia   Relevant Medications   atorvastatin (LIPITOR) 20 MG tablet   Other Relevant Orders   Lipid panel (Completed)   Tobacco use disorder, continuous    Increased tobacco use due to increased financial stress. Smokes 1/2ppd Did not continue wellbutrin in past as prescribed. Did not f/up due  to lack of insurance. Unable to wuit with wellbutrin dose 150mg  BID x 84months Advised about importance to quit tobacco use.      Vitamin D deficiency   Relevant Medications   Vitamin D, Ergocalciferol, (DRISDOL) 1.25 MG (50000 UNIT) CAPS capsule   Return in about 4 weeks (around 09/02/2022) for depression and anxiety.     Alysia Penna, NP

## 2022-08-06 ENCOUNTER — Other Ambulatory Visit (HOSPITAL_COMMUNITY): Payer: Self-pay

## 2022-08-06 LAB — PARATHYROID HORMONE, INTACT (NO CA): PTH: 62 pg/mL (ref 16–77)

## 2022-08-06 NOTE — Progress Notes (Signed)
Stable Follow instructions as discussed during office visit.

## 2022-08-09 ENCOUNTER — Telehealth: Payer: Self-pay | Admitting: Nurse Practitioner

## 2022-08-09 NOTE — Telephone Encounter (Signed)
Pt is told that the [facility] does not take her new medicaid. Please let her know when this has been changed.

## 2022-08-12 ENCOUNTER — Other Ambulatory Visit (HOSPITAL_COMMUNITY): Payer: Self-pay

## 2022-08-12 NOTE — Telephone Encounter (Signed)
Pt will need to contact insurance to see who is in her plan and call back with that info. b

## 2022-08-13 NOTE — Telephone Encounter (Signed)
Will call and let her know  

## 2022-08-19 ENCOUNTER — Telehealth: Payer: Self-pay | Admitting: Nurse Practitioner

## 2022-08-19 DIAGNOSIS — F411 Generalized anxiety disorder: Secondary | ICD-10-CM

## 2022-08-19 DIAGNOSIS — F331 Major depressive disorder, recurrent, moderate: Secondary | ICD-10-CM

## 2022-08-19 NOTE — Telephone Encounter (Signed)
Pt has called and left [facility] where she would like to go. The Dr is Dr Laural Benes. The fax # is [phone] Please call with updates.

## 2022-09-03 ENCOUNTER — Ambulatory Visit: Payer: Medicaid Other | Admitting: Nurse Practitioner

## 2022-09-27 ENCOUNTER — Other Ambulatory Visit (HOSPITAL_COMMUNITY): Payer: Self-pay

## 2022-10-03 ENCOUNTER — Other Ambulatory Visit: Payer: Self-pay

## 2022-10-03 ENCOUNTER — Encounter (HOSPITAL_COMMUNITY): Payer: Self-pay | Admitting: Emergency Medicine

## 2022-10-03 ENCOUNTER — Ambulatory Visit (HOSPITAL_COMMUNITY)
Admission: EM | Admit: 2022-10-03 | Discharge: 2022-10-03 | Disposition: A | Discharge status: Home / Self Care | Payer: Medicaid Other | Attending: Family | Admitting: Family

## 2022-10-03 DIAGNOSIS — R519 Headache, unspecified: Secondary | ICD-10-CM | POA: Diagnosis not present

## 2022-10-03 DIAGNOSIS — J449 Chronic obstructive pulmonary disease, unspecified: Secondary | ICD-10-CM

## 2022-10-03 DIAGNOSIS — R112 Nausea with vomiting, unspecified: Secondary | ICD-10-CM

## 2022-10-03 DIAGNOSIS — R5383 Other fatigue: Secondary | ICD-10-CM

## 2022-10-03 DIAGNOSIS — R062 Wheezing: Secondary | ICD-10-CM

## 2022-10-03 LAB — POCT FASTING CBG KUC MANUAL ENTRY: POCT Glucose (KUC): 85 mg/dL (ref 70–99)

## 2022-10-03 MED ORDER — KETOROLAC TROMETHAMINE 30 MG/ML IJ SOLN
30.0000 mg | Freq: Once | INTRAMUSCULAR | Status: AC
  Administered 2022-10-03: 30 mg via INTRAMUSCULAR

## 2022-10-03 MED ORDER — ONDANSETRON HCL 4 MG PO TABS: 4.0000 mg | ORAL_TABLET | Freq: Three times a day (TID) | ORAL | 0 refills | Status: DC | PRN

## 2022-10-03 MED ORDER — ONDANSETRON HCL 4 MG/2ML IJ SOLN
4.0000 mg | Freq: Once | INTRAMUSCULAR | Status: AC
  Administered 2022-10-03: 4 mg via INTRAMUSCULAR

## 2022-10-03 MED ORDER — KETOROLAC TROMETHAMINE 30 MG/ML IJ SOLN
INTRAMUSCULAR | Status: AC
  Filled 2022-10-03: qty 1

## 2022-10-03 MED ORDER — ONDANSETRON HCL 4 MG/2ML IJ SOLN
INTRAMUSCULAR | Status: AC
  Filled 2022-10-03: qty 2

## 2022-10-03 MED ORDER — ALBUTEROL SULFATE HFA 108 (90 BASE) MCG/ACT IN AERS
2.0000 | INHALATION_SPRAY | Freq: Four times a day (QID) | RESPIRATORY_TRACT | 1 refills | Status: AC | PRN
Start: 2022-10-03 — End: ?
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
## 2022-10-03 NOTE — Discharge Instructions (Signed)
Your blood sugar was 85 today which is normal. We gave you a shot of Zofran 4mg  to help with nausea and vomiting. We also gave you a shot of Toradol 30mg  to help with your headache. Continue to monitor your symptoms. May continue oral Zofran 4mg  every 8 hours as needed for nausea. Slowly start increasing fluid intake later today. Then may start bland solid foods such as toast, crackers, rice, applesauce as tolerated. If you are unable to still keep down any fluids over the next 24 to 36 hours despite trying Zofran, go to the ER ASAP. Also if your headache does not improve within 48 hours, also go to the ER ASAP. Otherwise, follow-up with a PCP in 2 weeks or sooner as planned.

## 2022-10-03 NOTE — ED Triage Notes (Signed)
Patient presents to [facility] for evaluation of nausea and vomiting since last night, 4 emesis in 12 hours, headache in the front, 8/10.

## 2022-12-09 ENCOUNTER — Ambulatory Visit (HOSPITAL_COMMUNITY)
Admission: EM | Admit: 2022-12-09 | Discharge: 2022-12-09 | Disposition: A | Discharge status: Home / Self Care | Payer: Medicaid Other | Attending: Internal Medicine | Admitting: Internal Medicine

## 2022-12-09 ENCOUNTER — Encounter (HOSPITAL_COMMUNITY): Payer: Self-pay

## 2022-12-09 DIAGNOSIS — M79605 Pain in left leg: Secondary | ICD-10-CM | POA: Diagnosis not present

## 2022-12-09 MED ORDER — KETOROLAC TROMETHAMINE 30 MG/ML IJ SOLN
30.0000 mg | Freq: Once | INTRAMUSCULAR | Status: AC
  Administered 2022-12-09: 30 mg via INTRAMUSCULAR

## 2022-12-09 MED ORDER — KETOROLAC TROMETHAMINE 30 MG/ML IJ SOLN
INTRAMUSCULAR | Status: AC
  Filled 2022-12-09: qty 1
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
## 2022-12-09 NOTE — ED Triage Notes (Signed)
Patient presents with pain in left foot and lower leg that has been present all day. Patient states on her way here she began having sharp pains in her leg. Patient states she stands at work all day, purchased new shoes yesterday and is not sure if it's related.  Patient reports she went to see podiatrist on Friday for an ingrown toenail removal in bilateral feet, right one removed and he did not remove left due to poor circulation.

## 2022-12-09 NOTE — Discharge Instructions (Addendum)
Likely musculoskeletal pain from inflammation from new shoes and standing on feet all day. Toradol shot today to help with pain. If pain worsens or fails to improve, return to clinic.

## 2023-01-03 ENCOUNTER — Ambulatory Visit: Payer: Medicaid Other | Admitting: Nurse Practitioner

## 2023-01-03 ENCOUNTER — Encounter: Payer: Self-pay | Admitting: Nurse Practitioner

## 2023-01-03 VITALS — BP 138/80 | HR 68 | Temp 97.3°F | Wt 188.6 lb

## 2023-01-03 DIAGNOSIS — E559 Vitamin D deficiency, unspecified: Secondary | ICD-10-CM

## 2023-01-03 DIAGNOSIS — F17209 Nicotine dependence, unspecified, with unspecified nicotine-induced disorders: Secondary | ICD-10-CM

## 2023-01-03 DIAGNOSIS — M79671 Pain in right foot: Secondary | ICD-10-CM | POA: Diagnosis not present

## 2023-01-03 DIAGNOSIS — Z23 Encounter for immunization: Secondary | ICD-10-CM | POA: Diagnosis not present

## 2023-01-03 DIAGNOSIS — E7800 Pure hypercholesterolemia, unspecified: Secondary | ICD-10-CM | POA: Diagnosis not present

## 2023-01-03 DIAGNOSIS — D500 Iron deficiency anemia secondary to blood loss (chronic): Secondary | ICD-10-CM

## 2023-01-03 DIAGNOSIS — G629 Polyneuropathy, unspecified: Secondary | ICD-10-CM | POA: Insufficient documentation

## 2023-01-03 DIAGNOSIS — M79672 Pain in left foot: Secondary | ICD-10-CM

## 2023-01-03 LAB — BASIC METABOLIC PANEL
BUN: 12 mg/dL (ref 6–23)
CO2: 24 meq/L (ref 19–32)
Calcium: 9.6 mg/dL (ref 8.4–10.5)
Chloride: 108 meq/L (ref 96–112)
Creatinine, Ser: 0.74 mg/dL (ref 0.40–1.20)
GFR: 85.28 mL/min (ref >60.00)
Glucose, Bld: 70 mg/dL (ref 70–99)
Potassium: 4 meq/L (ref 3.5–5.1)
Sodium: 142 meq/L (ref 135–145)

## 2023-01-03 LAB — VITAMIN D 25 HYDROXY (VIT D DEFICIENCY, FRACTURES): VITD: 20.35 ng/mL — ABNORMAL LOW (ref 30.00–100.00)

## 2023-01-03 LAB — TSH: TSH: 0.54 u[IU]/mL (ref 0.35–5.50)

## 2023-01-03 LAB — HEMOGLOBIN A1C: Hgb A1c MFr Bld: 5.4 % (ref 4.6–6.5)

## 2023-01-03 LAB — B12 AND FOLATE PANEL
Folate: 6.6 ng/mL (ref >5.9)
Vitamin B-12: 307 pg/mL (ref 211–911)

## 2023-01-03 MED ORDER — MELOXICAM 7.5 MG PO TABS
7.5000 mg | ORAL_TABLET | Freq: Every day | ORAL | 0 refills | Status: DC
Start: 2023-01-03 — End: 2023-02-14

## 2023-01-03 MED ORDER — GABAPENTIN 100 MG PO CAPS
100.0000 mg | ORAL_CAPSULE | Freq: Every day | ORAL | 5 refills | Status: DC
Start: 2023-01-03 — End: 2023-02-14

## 2023-01-03 MED ORDER — VARENICLINE TARTRATE 0.5 MG PO TABS
ORAL_TABLET | ORAL | 1 refills | Status: DC
Start: 2023-01-03 — End: 2023-05-02

## 2023-01-03 MED ORDER — NICOTINE 14 MG/24HR TD PT24
14.0000 mg | MEDICATED_PATCH | Freq: Every day | TRANSDERMAL | 0 refills | Status: DC
Start: 2023-01-03 — End: 2023-05-02

## 2023-01-03 NOTE — Patient Instructions (Addendum)
Use probiotic 1cap daily and cranberry capsule for vaginal odor Stop douching. Start gabapentin for foot pain Wear shoes with adequate support. You will be contacted to schedule appointment for vascular US. Start chantix and nicoderm for tobacco cessation Go to lab

## 2023-01-03 NOTE — Assessment & Plan Note (Signed)
>>  ASSESSMENT AND PLAN FOR FOOT PAIN, BILATERAL WRITTEN ON 01/03/2023  3:48 PM BY Lanny Lipkin LUM, NP  Chronic (L>R), worsening in last 3months, previously intermittent, now constant, worse with prolong standing. Describes as tingling and numbness. No calf pain, no pain with elevation and ambulation. No improvement with changing shoes. Current daily tobacco use, no foot injury.  PAD vs plantar fasciitis? Normal pedal pulse, thick toenails, normal skin temperature. Get arterial duplex, tsh, iron, B12, folate, hgbA1c, cbc, bmp Sent mobic 7.5mg  and gabapentin 100mg  F/up in 2weeks

## 2023-01-03 NOTE — Assessment & Plan Note (Addendum)
Smokes 1/2ppd Unable to quit with wellbutrin and nicoderm  She agreed to start chantix New rx sent F/up in 2weeks

## 2023-01-03 NOTE — Progress Notes (Signed)
Established Patient Visit  Patient: Elizabeth Best   DOB: May 23, 1955   67 y.o. Female  MRN: [id number] Visit Date: 01/03/2023  Subjective:    Chief Complaint  Patient presents with   Tingling    She has been having numbness and tingling in both legs and feet since April, but it has gotten worse.   HPI Foot pain, bilateral Chronic (L>R), worsening in last 3months, previously intermittent, now constant, worse with prolong standing. Describes as tingling and numbness. No calf pain, no pain with elevation and ambulation. No improvement with changing shoes. Current daily tobacco use, no foot injury.  PAD vs plantar fasciitis? Normal pedal pulse, thick toenails, normal skin temperature. Get arterial duplex, tsh, iron, B12, folate, hgbA1c, cbc, bmp Sent mobic 7.5mg  and gabapentin 100mg  F/up in 2weeks  Tobacco use disorder, continuous Smokes 1/2ppd Unable to quit with wellbutrin and nicoderm  She agreed to start chantix New rx sent F/up in 2weeks  Vitamin D deficiency Check Vit D  Pure hypercholesterolemia Check lipid panel  Iron deficiency anemia Repeat iron  Reviewed medical, surgical, and social history today  Medications: Outpatient Medications Prior to Visit  Medication Sig   albuterol (VENTOLIN HFA) 108 (90 Base) MCG/ACT inhaler Inhale 2 puffs into the lungs every 6 (six) hours as needed for wheezing or shortness of breath.   ondansetron (ZOFRAN) 4 MG tablet Take 1 tablet (4 mg total) by mouth every 8 (eight) hours as needed for nausea or vomiting.   Vitamin D, Ergocalciferol, (DRISDOL) 1.25 MG (50000 UNIT) CAPS capsule Take 1 capsule (50,000 Units total) by mouth every 7 (seven) days.   No facility-administered medications prior to visit.   Reviewed past medical and social history.   ROS per HPI above      Objective:  BP 138/80   Pulse 68   Temp (!) 97.3 F (36.3 C) (Temporal)   Wt 188 lb 9.6 oz (85.5 kg)   SpO2 99%   BMI 33.41 kg/m       Physical Exam  No results found for any visits on 01/03/23.    Assessment & Plan:    Problem List Items Addressed This Visit     Foot pain, bilateral - Primary    Chronic (L>R), worsening in last 3months, previously intermittent, now constant, worse with prolong standing. Describes as tingling and numbness. No calf pain, no pain with elevation and ambulation. No improvement with changing shoes. Current daily tobacco use, no foot injury.  PAD vs plantar fasciitis? Normal pedal pulse, thick toenails, normal skin temperature. Get arterial duplex, tsh, iron, B12, folate, hgbA1c, cbc, bmp Sent mobic 7.5mg  and gabapentin 100mg  F/up in 2weeks      Relevant Medications   gabapentin (NEURONTIN) 100 MG capsule   meloxicam (MOBIC) 7.5 MG tablet   Other Relevant Orders   Hemoglobin A1c   TSH   VAS [place] LOWER EXTREMITY ARTERIAL DUPLEX   Basic metabolic panel   O96 and Folate Panel   Iron deficiency anemia    Repeat iron      Relevant Orders   Iron, TIBC and Ferritin Panel   Pure hypercholesterolemia    Check lipid panel      Relevant Orders   VAS [place] LOWER EXTREMITY ARTERIAL DUPLEX   Tobacco use disorder, continuous    Smokes 1/2ppd Unable to quit with wellbutrin and nicoderm  She agreed to start chantix New rx sent F/up in 2weeks  Relevant Medications   varenicline (CHANTIX) 0.5 MG tablet   nicotine (NICODERM CQ) 14 mg/24hr patch   Other Relevant Orders   VAS [place] LOWER EXTREMITY ARTERIAL DUPLEX   Vitamin D deficiency    Check Vit D      Relevant Orders   VITAMIN D 25 Hydroxy (Vit-D Deficiency, Fractures)   Other Visit Diagnoses     Immunization due       Relevant Orders   Flu Vaccine Trivalent High Dose (Fluad) (Completed)      Return in about 2 weeks (around 01/17/2023) for vaginal odor and foot pain, need PFT completed.     Alysia Penna, NP

## 2023-01-03 NOTE — Assessment & Plan Note (Signed)
Repeat iron

## 2023-01-03 NOTE — Assessment & Plan Note (Signed)
Check Vit D

## 2023-01-03 NOTE — Assessment & Plan Note (Addendum)
Chronic (L>R), worsening in last 3months, previously intermittent, now constant, worse with prolong standing. Describes as tingling and numbness. No calf pain, no pain with elevation and ambulation. No improvement with changing shoes. Current daily tobacco use, no foot injury.  PAD vs plantar fasciitis? Normal pedal pulse, thick toenails, normal skin temperature. Get arterial duplex, tsh, iron, B12, folate, hgbA1c, cbc, bmp Sent mobic 7.5mg  and gabapentin 100mg  F/up in 2weeks

## 2023-01-03 NOTE — Assessment & Plan Note (Signed)
Check lipid panel

## 2023-01-04 LAB — IRON,TIBC AND FERRITIN PANEL
%SAT: 6 % — ABNORMAL LOW (ref 16–45)
Ferritin: 13 ng/mL — ABNORMAL LOW (ref 16–288)
Iron: 22 ug/dL — ABNORMAL LOW (ref 45–160)
TIBC: 374 ug/dL (ref 250–450)

## 2023-01-06 ENCOUNTER — Encounter: Payer: Self-pay | Admitting: Nurse Practitioner

## 2023-01-06 DIAGNOSIS — D500 Iron deficiency anemia secondary to blood loss (chronic): Secondary | ICD-10-CM

## 2023-01-07 ENCOUNTER — Telehealth: Payer: Self-pay | Admitting: Nurse Practitioner

## 2023-01-07 ENCOUNTER — Other Ambulatory Visit (HOSPITAL_BASED_OUTPATIENT_CLINIC_OR_DEPARTMENT_OTHER): Payer: Self-pay | Admitting: Nurse Practitioner

## 2023-01-07 DIAGNOSIS — I739 Peripheral vascular disease, unspecified: Secondary | ICD-10-CM

## 2023-01-07 NOTE — Telephone Encounter (Signed)
Pt called today returning CMA -sheena call but sheena is not in our office today. Please give the pt a call back

## 2023-01-08 ENCOUNTER — Telehealth: Payer: Self-pay | Admitting: Nurse Practitioner

## 2023-01-08 NOTE — Telephone Encounter (Signed)
I returned patient's call and documented under LAB encounter

## 2023-01-08 NOTE — Telephone Encounter (Signed)
Please call the pt back she is returning your call

## 2023-01-08 NOTE — Telephone Encounter (Signed)
LVM for patient to return call. 

## 2023-01-08 NOTE — Telephone Encounter (Signed)
Pt will return call around 3 today 9/18/224, that's when she gets a break at work.

## 2023-01-09 ENCOUNTER — Encounter (HOSPITAL_COMMUNITY): Payer: Self-pay

## 2023-01-09 ENCOUNTER — Ambulatory Visit (HOSPITAL_COMMUNITY)
Admission: RE | Admit: 2023-01-09 | Discharge: 2023-01-09 | Disposition: A | Discharge status: Home / Self Care | Payer: Medicaid Other | Source: Ambulatory Visit | Attending: Cardiovascular Disease | Admitting: Cardiovascular Disease

## 2023-01-09 ENCOUNTER — Other Ambulatory Visit: Payer: Self-pay | Admitting: Nurse Practitioner

## 2023-01-09 DIAGNOSIS — M79672 Pain in left foot: Secondary | ICD-10-CM

## 2023-01-09 DIAGNOSIS — I739 Peripheral vascular disease, unspecified: Secondary | ICD-10-CM | POA: Insufficient documentation

## 2023-01-09 NOTE — Telephone Encounter (Signed)
I spoke with patient and notified her of recent lab results and she was confused regarding referral and I explained to patient and she will call GI to schedule an appointment.

## 2023-01-09 NOTE — Telephone Encounter (Signed)
LVM for patient to return call.   Please document under lab encounter

## 2023-01-09 NOTE — Telephone Encounter (Signed)
LVM for patient to return call. 

## 2023-01-09 NOTE — Progress Notes (Signed)
entered referral to podiatry

## 2023-01-10 ENCOUNTER — Telehealth: Payer: Self-pay | Admitting: Gastroenterology

## 2023-01-10 LAB — VAS US ABI WITH/WO TBI
Left ABI: 1.27
Right ABI: 1.28

## 2023-01-10 NOTE — Telephone Encounter (Signed)
Inbound call from patient stating her iron levels are very low and her pcp is concerned it may be a GI bleed. Advised patient of next available with Dr. Tomasa Rand. Patient requesting a call to speak further. Please advise, thank you.

## 2023-01-10 NOTE — Telephone Encounter (Signed)
Pt reports her iron is low and her pcp wanted her to be seen for a possible EGD/? Bleeding. Pt scheduled to see Quentin Mulling PA 01/24/23 at 10:30am. Pt aware of appt.

## 2023-01-13 NOTE — Telephone Encounter (Signed)
I missed patient's call and LVM for patient to return call

## 2023-01-13 NOTE — Telephone Encounter (Signed)
Patient called back and she had not erased messages from last week and I told patient we discussed results last week and she did not have any questions or concerns

## 2023-01-15 ENCOUNTER — Encounter (HOSPITAL_COMMUNITY): Payer: Medicaid Other

## 2023-01-15 ENCOUNTER — Telehealth: Payer: Self-pay | Admitting: Hematology

## 2023-01-15 ENCOUNTER — Encounter: Payer: Self-pay | Admitting: Nurse Practitioner

## 2023-01-15 NOTE — Telephone Encounter (Signed)
Scheduled appointment per referral (already established patient). Left voicemail with appointment details as well as my direct ext [phone].

## 2023-01-17 ENCOUNTER — Ambulatory Visit: Payer: 59 | Admitting: Podiatry

## 2023-01-17 ENCOUNTER — Encounter: Payer: Self-pay | Admitting: Nurse Practitioner

## 2023-01-17 ENCOUNTER — Other Ambulatory Visit (HOSPITAL_COMMUNITY)
Admission: RE | Admit: 2023-01-17 | Discharge: 2023-01-17 | Disposition: A | Discharge status: Home / Self Care | Payer: Medicaid Other | Source: Ambulatory Visit | Attending: Nurse Practitioner | Admitting: Nurse Practitioner

## 2023-01-17 ENCOUNTER — Ambulatory Visit: Payer: Medicaid Other | Admitting: Nurse Practitioner

## 2023-01-17 ENCOUNTER — Encounter (HOSPITAL_COMMUNITY): Payer: Medicaid Other

## 2023-01-17 VITALS — BP 124/84 | HR 78 | Temp 98.0°F | Wt 190.4 lb

## 2023-01-17 DIAGNOSIS — N898 Other specified noninflammatory disorders of vagina: Secondary | ICD-10-CM

## 2023-01-17 DIAGNOSIS — R102 Pelvic and perineal pain: Secondary | ICD-10-CM

## 2023-01-17 DIAGNOSIS — J449 Chronic obstructive pulmonary disease, unspecified: Secondary | ICD-10-CM

## 2023-01-17 DIAGNOSIS — F17209 Nicotine dependence, unspecified, with unspecified nicotine-induced disorders: Secondary | ICD-10-CM | POA: Diagnosis not present

## 2023-01-17 DIAGNOSIS — J439 Emphysema, unspecified: Secondary | ICD-10-CM | POA: Insufficient documentation

## 2023-01-17 DIAGNOSIS — R350 Frequency of micturition: Secondary | ICD-10-CM | POA: Diagnosis present

## 2023-01-17 DIAGNOSIS — R0609 Other forms of dyspnea: Secondary | ICD-10-CM | POA: Diagnosis not present

## 2023-01-17 DIAGNOSIS — D508 Other iron deficiency anemias: Secondary | ICD-10-CM

## 2023-01-17 LAB — POCT URINALYSIS DIPSTICK
Bilirubin, UA: NEGATIVE
Blood, UA: NEGATIVE
Glucose, UA: NEGATIVE
Ketones, UA: NEGATIVE
Leukocytes, UA: NEGATIVE
Nitrite, UA: NEGATIVE
Protein, UA: NEGATIVE
Spec Grav, UA: 1.01 (ref 1.010–1.025)
Urobilinogen, UA: 0.2 U/dL
pH, UA: 6 (ref 5.0–8.0)

## 2023-01-17 MED ORDER — ALBUTEROL SULFATE HFA 108 (90 BASE) MCG/ACT IN AERS
2.0000 | INHALATION_SPRAY | Freq: Four times a day (QID) | RESPIRATORY_TRACT | 1 refills | Status: DC | PRN
Start: 2023-01-17 — End: 2023-10-09

## 2023-01-17 MED ORDER — TIOTROPIUM BROMIDE MONOHYDRATE 18 MCG IN CAPS
18.0000 ug | ORAL_CAPSULE | Freq: Every day | RESPIRATORY_TRACT | 11 refills | Status: DC
Start: 2023-01-17 — End: 2023-06-25

## 2023-01-17 MED ORDER — IRON (FERROUS SULFATE) 325 (65 FE) MG PO TABS
325.0000 mg | ORAL_TABLET | Freq: Every day | ORAL | Status: DC
Start: 2023-01-17 — End: 2023-02-14

## 2023-01-17 NOTE — Patient Instructions (Addendum)
Normal urinalysis Decreased caffeine intake: 2cups in AM Avoid sugary drinks. Increased oral hydration with water Do not take multivitamin and iron supplement with coffee. Maintain an iron rich diet while waiting for appointment with hematology and GI Reginal Lutes is not covered by your insurance due to lack of history of known cardiac disease. You will be contacted to schedule appointment with GYN Stop tobacco use Start chantix as prescribed  Go to [address] for CXR spirometry did not improve with albuterol inhaler.  Iron-Rich Diet  Iron is a mineral that helps your body produce hemoglobin. Hemoglobin is a protein in red blood cells that carries oxygen to your body's tissues. Eating too little iron may cause you to feel weak and tired, and it can increase your risk of infection. Iron is naturally found in many foods, and many foods have iron added to them (are iron-fortified). You may need to follow an iron-rich diet if you do not have enough iron in your body due to certain medical conditions. The amount of iron that you need each day depends on your age, your sex, and any medical conditions you have. Follow instructions from your health care provider or a dietitian about how much iron you should eat each day. What are tips for following this plan? Reading food labels Check food labels to see how many milligrams (mg) of iron are in each serving. Cooking Cook foods in pots and pans that are made from iron. Take these steps to make it easier for your body to absorb iron from certain foods: Soak beans overnight before cooking. Soak whole grains overnight and drain them before using. Ferment flours before baking, such as by using yeast in bread dough. Meal planning When you eat foods that contain iron, you should eat them with foods that are high in vitamin C. These include oranges, peppers, tomatoes, potatoes, and mangoes. Vitamin C helps your body absorb iron. Certain foods and drinks  prevent your body from absorbing iron properly. Avoid eating these foods in the same meal as iron-rich foods or with iron supplements. These foods include: Coffee, black tea, and red wine. Milk, dairy products, and foods that are high in calcium. Beans and soybeans. Whole grains. General information Take iron supplements only as told by your health care provider. An overdose of iron can be life-threatening. If you were prescribed iron supplements, take them with orange juice or a vitamin C supplement. When you eat iron-fortified foods or take an iron supplement, you should also eat foods that naturally contain iron, such as meat, poultry, and fish. Eating naturally iron-rich foods helps your body absorb the iron that is added to other foods or contained in a supplement. Iron from animal sources is better absorbed than iron from plant sources. What foods should I eat? Fruits Prunes. Raisins. Eat fruits high in vitamin C, such as oranges, grapefruits, and strawberries, with iron-rich foods. Vegetables Spinach (cooked). Green peas. Broccoli. Fermented vegetables. Eat vegetables high in vitamin C, such as leafy greens, potatoes, bell peppers, and tomatoes, with iron-rich foods. Grains Iron-fortified breakfast cereal. Iron-fortified whole-wheat bread. Enriched rice. Sprouted grains. Meats and other proteins Beef liver. Beef. [place]. Chicken. Oysters. Shrimp. Tuna. Sardines. Chickpeas. Nuts. Tofu. Pumpkin seeds. Beverages Tomato juice. Fresh orange juice. Prune juice. Hibiscus tea. Iron-fortified instant breakfast shakes. Sweets and desserts Blackstrap molasses. Seasonings and condiments Tahini. Fermented soy sauce. Other foods Wheat germ. The items listed above may not be a complete list of recommended foods and beverages. Contact a  dietitian for more information. What foods should I limit? These are foods that should be limited while eating iron-rich foods as they can reduce the absorption  of iron in your body. Grains Whole grains. Bran cereal. Bran flour. Meats and other proteins Soybeans. Products made from soy protein. Black beans. Lentils. Mung beans. Split peas. Dairy Milk. Cream. Cheese. Yogurt. Cottage cheese. Beverages Coffee. Black tea. Red wine. Sweets and desserts Cocoa. Chocolate. Ice cream. Seasonings and condiments Basil. Oregano. Large amounts of parsley. The items listed above may not be a complete list of foods and beverages you should limit. Contact a dietitian for more information. Summary Iron is a mineral that helps your body produce hemoglobin. Hemoglobin is a protein in red blood cells that carries oxygen to your body's tissues. Iron is naturally found in many foods, and many foods have iron added to them (are iron-fortified). When you eat foods that contain iron, you should eat them with foods that are high in vitamin C. Vitamin C helps your body absorb iron. Certain foods and drinks prevent your body from absorbing iron properly, such as whole grains and dairy products. You should avoid eating these foods in the same meal as iron-rich foods or with iron supplements. This information is not intended to replace advice given to you by your health care provider. Make sure you discuss any questions you have with your health care provider. Document Revised: 03/20/2020 Document Reviewed: 03/20/2020 Elsevier Patient Education  2024 [employer].

## 2023-01-17 NOTE — Assessment & Plan Note (Addendum)
Chronic, no symptom at rest, no CP, SOB with walking uphill or chasing my dog. CT chest 2023 suggested mild emphysema. CAT score at 5, mMRA at 1: GOLD group A Spirometry today indicates: pre albuterol-moderate obstruction, FEV1/FVC 0.60, FEV1 1.09. Post. Albuterol- no improvement in FEV1 and FEV1/FVC Obtain CXR, sent albuterol and spiriva Provided chantix and nicoderm to help quit tobacco use F/up in 48month

## 2023-01-17 NOTE — Progress Notes (Signed)
Established Patient Visit  Patient: Elizabeth Best   DOB: [birth year]   67 y.o. Female  MRN: [id number] Visit Date: 01/17/2023  Subjective:    Chief Complaint  Patient presents with   Medical Management of Chronic Issues    2 week follow up vaginal odor and foot pain, needs PFT. Pain in right foot and toes. Vaginal odor has cleared. Chantix never received and albuterol inhaler. Patches aren't helping. Frequent urination since starting gabapentin.   HPI Emphysema lung (HCC) Chronic, no symptom at rest, no CP, SOB with walking uphill or chasing my dog. CT chest 2023 suggested mild emphysema. CAT score at 5, mMRA at 1: GOLD group A Spirometry today indicates: pre albuterol-moderate obstruction, FEV1/FVC 0.60, FEV1 1.09. Post. Albuterol- no improvement in FEV1 and FEV1/FVC Obtain CXR, sent albuterol and spiriva Provided chantix and nicoderm to help quit tobacco use F/up in 71month  Iron deficiency anemia Admits to inadequate dietary iron and takes multivitamin with coffee. Has upcoming appointment with hematology 02/14/2023 and appointment with GI 01/24/24.  Advised to avoid taking multivitamin and iron supplement with coffee. Take after meal, Maintain an iron rich diet while waiting for appointment with hematology and GI  Vaginal pain Chronic vaginal pain, onset several years ago, but worse in last 53yr.  Associated with Intermittent vaginal itching and odor. Previously douched daily, stopped 2weeks ago. Not sexually active for several years S/p hysterectomy and oophorectomy  Unable to tolerate vaginal exam with speculum Normal UA Pending vaginal swab  Reviewed medical, surgical, and social history today  Medications: Outpatient Medications Prior to Visit  Medication Sig   gabapentin (NEURONTIN) 100 MG capsule Take 1 capsule (100 mg total) by mouth at bedtime.   meloxicam (MOBIC) 7.5 MG tablet Take 1 tablet (7.5 mg total) by mouth daily. With food    nicotine (NICODERM CQ) 14 mg/24hr patch Place 1 patch (14 mg total) onto the skin daily.   varenicline (CHANTIX) 0.5 MG tablet 1tab daily x 3days, then 1tab BID x 1week, then 2tabs BID continuously   Vitamin D, Ergocalciferol, (DRISDOL) 1.25 MG (50000 UNIT) CAPS capsule Take 1 capsule (50,000 Units total) by mouth every 7 (seven) days.   [DISCONTINUED] albuterol (VENTOLIN HFA) 108 (90 Base) MCG/ACT inhaler Inhale 2 puffs into the lungs every 6 (six) hours as needed for wheezing or shortness of breath.   [DISCONTINUED] ondansetron (ZOFRAN) 4 MG tablet Take 1 tablet (4 mg total) by mouth every 8 (eight) hours as needed for nausea or vomiting. (Patient not taking: Reported on 01/17/2023)   No facility-administered medications prior to visit.   Reviewed past medical and social history.   ROS per HPI above  Last CBC Lab Results  Component Value Date   WBC 10.4 08/05/2022   HGB 14.1 08/05/2022   HCT 43.6 08/05/2022   MCV 77.4 (L) 08/05/2022   MCH 25.2 (L) 11/14/2021   RDW 16.5 (H) 08/05/2022   PLT 388.0 08/05/2022   Last metabolic panel Lab Results  Component Value Date   GLUCOSE 70 01/03/2023   NA 142 01/03/2023   K 4.0 01/03/2023   CL 108 01/03/2023   CO2 24 01/03/2023   BUN 12 01/03/2023   CREATININE 0.74 01/03/2023   GFR 85.28 01/03/2023   CALCIUM 9.6 01/03/2023   PROT 7.1 08/05/2022   ALBUMIN 4.2 08/05/2022   LABGLOB 2.9 07/18/2020   AGRATIO 1.5 07/18/2020   BILITOT 0.3 08/05/2022   ALKPHOS  98 08/05/2022   AST 13 08/05/2022   ALT 20 08/05/2022   ANIONGAP 7 11/14/2021   Last lipids Lab Results  Component Value Date   CHOL 229 (H) 08/05/2022   HDL 33.90 (L) 08/05/2022   LDLCALC 157 (H) 08/05/2022   TRIG 189.0 (H) 08/05/2022   CHOLHDL 7 08/05/2022   Last hemoglobin A1c Lab Results  Component Value Date   HGBA1C 5.4 01/03/2023   Last thyroid functions Lab Results  Component Value Date   TSH 0.54 01/03/2023        Objective:  BP 124/84 (BP Location: Left  Arm, Patient Position: Sitting, Cuff Size: Large)   Pulse 78   Temp 98 F (36.7 C) (Temporal)   Wt 190 lb 6.4 oz (86.4 kg)   SpO2 99%   BMI 33.73 kg/m      Physical Exam Exam conducted with a chaperone present.  Cardiovascular:     Rate and Rhythm: Normal rate and regular rhythm.     Pulses: Normal pulses.     Heart sounds: Normal heart sounds.  Pulmonary:     Effort: Pulmonary effort is normal.     Breath sounds: Normal breath sounds.  Genitourinary:    General: Normal vulva.     Labia:        Right: No rash, tenderness or lesion.        Left: No rash, tenderness or lesion.      Vagina: Vaginal discharge present.     Comments: Unable to complete pelvic exam due to discomfort with speculum Musculoskeletal:     Right lower leg: No edema.     Left lower leg: No edema.  Neurological:     Mental Status: She is alert and oriented to person, place, and time.  Psychiatric:        Mood and Affect: Mood normal.        Behavior: Behavior normal.        Thought Content: Thought content normal.     Results for orders placed or performed in visit on 01/17/23  POCT urinalysis dipstick  Result Value Ref Range   Color, UA YELLOW    Clarity, UA CLEAR    Glucose, UA Negative Negative   Bilirubin, UA NEG    Ketones, UA NEG    Spec Grav, UA 1.010 1.010 - 1.025   Blood, UA NEG    pH, UA 6.0 5.0 - 8.0   Protein, UA Negative Negative   Urobilinogen, UA 0.2 0.2 or 1.0 E.U./dL   Nitrite, UA NEG    Leukocytes, UA Negative Negative   Appearance CLEAR    Odor        Assessment & Plan:    Problem List Items Addressed This Visit     Emphysema lung (HCC)    Chronic, no symptom at rest, no CP, SOB with walking uphill or chasing my dog. CT chest 2023 suggested mild emphysema. CAT score at 5, mMRA at 1: GOLD group A Spirometry today indicates: pre albuterol-moderate obstruction, FEV1/FVC 0.60, FEV1 1.09. Post. Albuterol- no improvement in FEV1 and FEV1/FVC Obtain CXR, sent  albuterol and spiriva Provided chantix and nicoderm to help quit tobacco use F/up in 82month      Relevant Medications   albuterol (VENTOLIN HFA) 108 (90 Base) MCG/ACT inhaler   tiotropium (SPIRIVA) 18 MCG inhalation capsule   Iron deficiency anemia    Admits to inadequate dietary iron and takes multivitamin with coffee. Has upcoming appointment with hematology 02/14/2023 and appointment with  GI 01/24/24.  Advised to avoid taking multivitamin and iron supplement with coffee. Take after meal, Maintain an iron rich diet while waiting for appointment with hematology and GI      Relevant Medications   Iron, Ferrous Sulfate, 325 (65 Fe) MG TABS   Tobacco use disorder, continuous   Relevant Medications   tiotropium (SPIRIVA) 18 MCG inhalation capsule   Other Relevant Orders   DG Chest 2 View   Vaginal pain    Chronic vaginal pain, onset several years ago, but worse in last 6yr.  Associated with Intermittent vaginal itching and odor. Previously douched daily, stopped 2weeks ago. Not sexually active for several years S/p hysterectomy and oophorectomy  Unable to tolerate vaginal exam with speculum Normal UA Pending vaginal swab      Relevant Orders   Ambulatory referral to Gynecology   Other Visit Diagnoses     Urinary frequency    -  Primary   Relevant Orders   POCT urinalysis dipstick (Completed)   Cervicovaginal ancillary only( [place])   Vaginal discharge       Relevant Orders   Cervicovaginal ancillary only( [place])   COPD, mild (HCC)       Relevant Medications   albuterol (VENTOLIN HFA) 108 (90 Base) MCG/ACT inhaler   tiotropium (SPIRIVA) 18 MCG inhalation capsule      Return in about 4 weeks (around 02/14/2023) for tobacco cessation.     Alysia Penna, NP

## 2023-01-17 NOTE — Assessment & Plan Note (Signed)
Chronic vaginal pain, onset several years ago, but worse in last 46yr.  Associated with Intermittent vaginal itching and odor. Previously douched daily, stopped 2weeks ago. Not sexually active for several years S/p hysterectomy and oophorectomy  Unable to tolerate vaginal exam with speculum Normal UA Pending vaginal swab

## 2023-01-17 NOTE — Assessment & Plan Note (Addendum)
Admits to inadequate dietary iron and takes multivitamin with coffee. Has upcoming appointment with hematology 02/14/2023 and appointment with GI 01/24/24.  Advised to avoid taking multivitamin and iron supplement with coffee. Take after meal, Maintain an iron rich diet while waiting for appointment with hematology and GI

## 2023-01-18 ENCOUNTER — Encounter: Payer: Self-pay | Admitting: Nurse Practitioner

## 2023-01-20 ENCOUNTER — Telehealth: Payer: Self-pay | Admitting: Nurse Practitioner

## 2023-01-20 ENCOUNTER — Encounter: Payer: Self-pay | Admitting: Nurse Practitioner

## 2023-01-20 LAB — CERVICOVAGINAL ANCILLARY ONLY
Bacterial Vaginitis (gardnerella): NEGATIVE
Candida Glabrata: NEGATIVE
Candida Vaginitis: NEGATIVE
Comment: NEGATIVE
Comment: NEGATIVE
Comment: NEGATIVE

## 2023-01-20 NOTE — Progress Notes (Signed)
Stable Follow instructions as discussed during office visit.

## 2023-01-20 NOTE — Telephone Encounter (Signed)
LVM to CB Typically it is recommended to set a quit date 1week after start of chantix. The goal is for you to get rid of all your cigarettes within a week of starting chantix. IF you are not able to completely get rid of cigarettes within a month of starting  chantix, I recommended use of nicotine gum as needed. Therefore, you should not use nicotine patch if you are able to tolerate chantix.

## 2023-01-20 NOTE — Telephone Encounter (Signed)
LVM & sent MyChart

## 2023-01-22 ENCOUNTER — Encounter: Payer: Self-pay | Admitting: Podiatry

## 2023-01-24 ENCOUNTER — Ambulatory Visit
Admission: RE | Admit: 2023-01-24 | Discharge: 2023-01-24 | Disposition: A | Discharge status: Home / Self Care | Payer: Medicaid Other | Source: Ambulatory Visit | Attending: Nurse Practitioner

## 2023-01-24 ENCOUNTER — Ambulatory Visit: Payer: Medicaid Other | Admitting: Podiatry

## 2023-01-24 ENCOUNTER — Ambulatory Visit (INDEPENDENT_AMBULATORY_CARE_PROVIDER_SITE_OTHER): Payer: Medicaid Other | Admitting: Physician Assistant

## 2023-01-24 ENCOUNTER — Other Ambulatory Visit (INDEPENDENT_AMBULATORY_CARE_PROVIDER_SITE_OTHER): Payer: Medicaid Other

## 2023-01-24 ENCOUNTER — Encounter: Payer: Self-pay | Admitting: Hematology

## 2023-01-24 ENCOUNTER — Encounter: Payer: Self-pay | Admitting: Physician Assistant

## 2023-01-24 VITALS — BP 126/70 | HR 81 | Ht 63.0 in | Wt 189.8 lb

## 2023-01-24 DIAGNOSIS — R200 Anesthesia of skin: Secondary | ICD-10-CM

## 2023-01-24 DIAGNOSIS — D509 Iron deficiency anemia, unspecified: Secondary | ICD-10-CM

## 2023-01-24 DIAGNOSIS — M216X2 Other acquired deformities of left foot: Secondary | ICD-10-CM | POA: Diagnosis not present

## 2023-01-24 DIAGNOSIS — F17209 Nicotine dependence, unspecified, with unspecified nicotine-induced disorders: Secondary | ICD-10-CM

## 2023-01-24 DIAGNOSIS — D132 Benign neoplasm of duodenum: Secondary | ICD-10-CM

## 2023-01-24 DIAGNOSIS — K5520 Angiodysplasia of colon without hemorrhage: Secondary | ICD-10-CM

## 2023-01-24 DIAGNOSIS — R0609 Other forms of dyspnea: Secondary | ICD-10-CM | POA: Diagnosis not present

## 2023-01-24 DIAGNOSIS — K259 Gastric ulcer, unspecified as acute or chronic, without hemorrhage or perforation: Secondary | ICD-10-CM | POA: Diagnosis not present

## 2023-01-24 DIAGNOSIS — R202 Paresthesia of skin: Secondary | ICD-10-CM

## 2023-01-24 DIAGNOSIS — M216X1 Other acquired deformities of right foot: Secondary | ICD-10-CM

## 2023-01-24 LAB — COMPREHENSIVE METABOLIC PANEL
ALT: 21 U/L (ref 0–35)
AST: 15 U/L (ref 0–37)
Albumin: 4.2 g/dL (ref 3.5–5.2)
Alkaline Phosphatase: 85 U/L (ref 39–117)
BUN: 12 mg/dL (ref 6–23)
CO2: 29 meq/L (ref 19–32)
Calcium: 9.7 mg/dL (ref 8.4–10.5)
Chloride: 106 meq/L (ref 96–112)
Creatinine, Ser: 0.73 mg/dL (ref 0.40–1.20)
GFR: 86.65 mL/min (ref >60.00)
Glucose, Bld: 90 mg/dL (ref 70–99)
Potassium: 4 meq/L (ref 3.5–5.1)
Sodium: 142 meq/L (ref 135–145)
Total Bilirubin: 0.3 mg/dL (ref 0.2–1.2)
Total Protein: 7.3 g/dL (ref 6.0–8.3)

## 2023-01-24 LAB — CBC WITH DIFFERENTIAL/PLATELET
Basophils Absolute: 0.1 10*3/uL (ref 0.0–0.1)
Basophils Relative: 0.9 % (ref 0.0–3.0)
Eosinophils Absolute: 0.2 10*3/uL (ref 0.0–0.7)
Eosinophils Relative: 2.2 % (ref 0.0–5.0)
HCT: 42.2 % (ref 36.0–46.0)
Hemoglobin: 13.4 g/dL (ref 12.0–15.0)
Lymphocytes Relative: 22.2 % (ref 12.0–46.0)
Lymphs Abs: 2.2 10*3/uL (ref 0.7–4.0)
MCHC: 31.8 g/dL (ref 30.0–36.0)
MCV: 77.7 fL — ABNORMAL LOW (ref 78.0–100.0)
Monocytes Absolute: 0.4 10*3/uL (ref 0.1–1.0)
Monocytes Relative: 4.5 % (ref 3.0–12.0)
Neutro Abs: 6.9 10*3/uL (ref 1.4–7.7)
Neutrophils Relative %: 70.2 % (ref 43.0–77.0)
Platelets: 420 10*3/uL — ABNORMAL HIGH (ref 150.0–400.0)
RBC: 5.43 Mil/uL — ABNORMAL HIGH (ref 3.87–5.11)
RDW: 17 % — ABNORMAL HIGH (ref 11.5–15.5)
WBC: 9.8 10*3/uL (ref 4.0–10.5)

## 2023-01-24 MED ORDER — HYDROCORTISONE (PERIANAL) 2.5 % EX CREA: 1.0000 | TOPICAL_CREAM | Freq: Two times a day (BID) | CUTANEOUS | 2 refills | Status: DC

## 2023-01-24 MED ORDER — PANTOPRAZOLE SODIUM 40 MG PO TBEC
40.0000 mg | DELAYED_RELEASE_TABLET | Freq: Every day | ORAL | 3 refills | Status: DC
Start: 2023-01-24 — End: 2023-05-02

## 2023-01-24 NOTE — Patient Instructions (Addendum)
Your provider has requested that you go to the basement level for lab work before leaving today. Press "B" on the elevator. The lab is located at the first door on the left as you exit the elevator.   No aleve, ibuprofen, goody powders, as these are antiinflammatories and can cause inflammation in your stomach, increase bleeding risk and cause ulcers.  You can talk with PCP about alternative pain options.  Can do tyelnol max 3000 mg a day, salon pas patches are over the counter and voltern gel is topical antiinflammatory that is safe.   Please take your proton pump inhibitor medication, protonix 40 mg Please take this medication 30 minutes to 1 hour before meals- this makes it more effective.  Avoid spicy and acidic foods Avoid fatty foods Limit your intake of coffee, tea, alcohol, and carbonated drinks Work to maintain a healthy weight Keep the head of the bed elevated at least 3 inches with blocks or a wedge pillow if you are having any nighttime symptoms Stay upright for 2 hours after eating Avoid meals and snacks three to four hours before bedtime  Follow up with hematology for iron def and get supporitve care.   We will contact you to set up capsule   _______________________________________________________  If your blood pressure at your visit was 140/90 or greater, please contact your primary care physician to follow up on this.  _______________________________________________________  If you are age 107 or older, your body mass index should be between 23-30. Your Body mass index is 33.62 kg/m. If this is out of the aforementioned range listed, please consider follow up with your Primary Care Provider.  If you are age 70 or younger, your body mass index should be between 19-25. Your Body mass index is 33.62 kg/m. If this is out of the aformentioned range listed, please consider follow up with your Primary Care Provider.    ________________________________________________________  The [facility] providers would like to encourage you to use [facility] to communicate with providers for non-urgent requests or questions.  Due to long hold times on the telephone, sending your provider a message by [facility] may be a faster and more efficient way to get a response.  Please allow 48 business hours for a response.  Please remember that this is for non-urgent requests.  _______________________________________________________  Thank you for entrusting me with your care and choosing [facility].  Quentin Mulling PA-C

## 2023-01-24 NOTE — Progress Notes (Signed)
Subjective:  Patient ID: Elizabeth Best, female    DOB: 04-Jan-1956,  MRN: [id number]  Chief Complaint  Patient presents with   Numbness    Pt stated that it feels like pins and needles     67 y.o. female presents with the above complaint. Patient presents with complaint of numbness tingling to the toes feels like pins-and-needles.  She has not seen and was prior to see me denies any other acute complaints.  Wanted to get it evaluated.  Hurts with ambulation as her pressure.  She has a flatfoot deformity as well.  She has a history of low back pain.   Review of Systems: Negative except as noted in the HPI. Denies N/V/F/Ch.  Past Medical History:  Diagnosis Date   Anxiety    COPD (chronic obstructive pulmonary disease) (HCC)    Depression    Hyperlipidemia    Hypertension    no currently on bp meds 08/16/21   Iron deficiency anemia    Osteopenia    Substance abuse (HCC)    History of crack cocaine abuse.    Current Outpatient Medications:    albuterol (VENTOLIN HFA) 108 (90 Base) MCG/ACT inhaler, Inhale 2 puffs into the lungs every 6 (six) hours as needed for wheezing or shortness of breath., Disp: 18 g, Rfl: 1   gabapentin (NEURONTIN) 100 MG capsule, Take 1 capsule (100 mg total) by mouth at bedtime., Disp: 30 capsule, Rfl: 5   hydrocortisone (ANUSOL-HC) 2.5 % rectal cream, Place 1 Application rectally 2 (two) times daily., Disp: 30 g, Rfl: 2   Iron, Ferrous Sulfate, 325 (65 Fe) MG TABS, Take 325 mg by mouth daily., Disp: , Rfl:    meloxicam (MOBIC) 7.5 MG tablet, Take 1 tablet (7.5 mg total) by mouth daily. With food, Disp: 30 tablet, Rfl: 0   nicotine (NICODERM CQ) 14 mg/24hr patch, Place 1 patch (14 mg total) onto the skin daily., Disp: 28 patch, Rfl: 0   pantoprazole (PROTONIX) 40 MG tablet, Take 1 tablet (40 mg total) by mouth daily., Disp: 30 tablet, Rfl: 3   tiotropium (SPIRIVA) 18 MCG inhalation capsule, Place 1 capsule (18 mcg total) into inhaler and inhale daily., Disp:  30 capsule, Rfl: 11   varenicline (CHANTIX) 0.5 MG tablet, 1tab daily x 3days, then 1tab BID x 1week, then 2tabs BID continuously, Disp: 90 tablet, Rfl: 1   Vitamin D, Ergocalciferol, (DRISDOL) 1.25 MG (50000 UNIT) CAPS capsule, Take 1 capsule (50,000 Units total) by mouth every 7 (seven) days., Disp: 12 capsule, Rfl: 0  Social History   Tobacco Use  Smoking Status Every Day   Current packs/day: 0.50   Average packs/day: 0.5 packs/day for 35.8 years (17.9 ttl pk-yrs)   Types: Cigarettes   Start date: 04/24/1987  Smokeless Tobacco Never    No Known Allergies Objective:  There were no vitals filed for this visit. There is no height or weight on file to calculate BMI. Constitutional Well developed. Well nourished.  Vascular Dorsalis pedis pulses palpable bilaterally. Posterior tibial pulses palpable bilaterally. Capillary refill normal to all digits.  No cyanosis or clubbing noted. Pedal hair growth normal.  Neurologic Normal speech. Oriented to person, place, and time. Epicritic sensation to light touch grossly present bilaterally.  Dermatologic Nails well groomed and normal in appearance. No open wounds. No skin lesions.  Orthopedic: Gait examination shows pes planovalgus deformity with calcaneal valgus to many toes sign is partially very weak.  The arch with dorsiflexion of the hallux.  Negative Tinel's sign  no tarsal tunnel syndrome or common peroneal nerve syndrome noted.   Radiographs: Not Assessment:   1. Numbness and tingling of foot   2. Other acquired deformities of right foot   3. Other acquired deformities of left foot    Plan:  Patient was evaluated and treated and all questions answered.  Bilateral numbness tingling with underlying foot deformity/pes planovalgus and history of low back pain -All questions and concerns were discussed with the patient in extensive detail I discussed shoe gear modification also discussed with her she would benefit from a back  specialist evaluation.  She states understanding will do so. -If any foot and ankle issues arise in the future she will come back and see me.  No follow-ups on file.

## 2023-01-24 NOTE — Progress Notes (Signed)
01/24/2023 Elizabeth Best [id number] 05-Aug-1955  Referring provider: Anne Ng, NP Primary GI doctor: Dr. Tomasa Rand  ASSESSMENT AND PLAN:   Iron Deficiency Anemia Patient has a history of low iron levels, requiring iron infusions and recently started on oral iron supplementation a week ago. No overt GI bleeding noted. Patient has a history of AVMs in the cecum and small intestines. -Based off history and physical exam, negative FOBT in the office likely this is indolent/interspersed AVMs, possible peptic ulcers with NSAID use but less likely -Continue oral iron supplementation. -Follow up with Hematology on 02/14/2023 for infusion -Discuss with Dr. Tomasa Rand regarding the need for repeat colonoscopy/enteroscopy or capsule endoscopy, patient states is very difficult to get off work with her current job and she would prefer capsule endoscopy or trying to avoid missing work is much as possible. I think patient would likely benefit from colonoscopy/enteroscopy in the hospital for APC for AVM he will discuss further with Dr. Tomasa Rand  Chronic NSAID use Patient reports daily use of NSAIDs (Aleve, ibuprofen) and recently started on Meloxicam for chronic pain. This increases the risk for gastritis and peptic ulcer disease. -Start Protonix once daily, 30 minutes before food, to protect the stomach. -Advise patient to reduce or stop NSAID use and discuss alternative pain management with primary care provider. -Continue Meloxicam with food, but avoid concurrent use with Aleve or ibuprofen. Tylenol is safe to use.  Chronic Pain Patient reports chronic pain in feet and back, requiring daily NSAID use. -Advise patient to discuss alternative pain management strategies with primary care provider.  Possible Hemorrhoids Patient reports occasional anal itching. Internal hemorrhoids noted on rectal exam. topical cream for symptom relief.      Patient Care Team: Nche, [name],  NP as PCP - General (Internal Medicine)  HISTORY OF PRESENT ILLNESS: 67 y.o. female with a past medical history of COPD, depression and osteopenia and others listed below presents for evaluation of IDA.   08/18/2020 Colonoscopy by Dr. Loreta Ave and was found to have numerous AVMs in the cecum which were treated with APC ablation and Hemoclip placement. A small sigmoid tubular adenoma was also removed. She was recommended repeat colonoscopy in 7 years for surveillance.  08/18/2020 small bowel enteroscopy at that time and was found to have numerous small nonbleeding gastric ulcers, pyloric stenosis and several AVMs within the jejunum which were treated with APC ablation.  Gastric biopsies revealed chronic active gastritis with H. pylori as well as focal intestinal metaplasia.   04/2022 Negative h pylori stool 08/16/2021 EGD Doudenal polyp prevancerous, repeat EGD 07/2022, gastric intestinal metaplasia, limited to the antrum only , neg H pylori.    Discussed the use of AI scribe software for clinical note transcription with the patient, who gave verbal consent to proceed.  History of Present Illness   The patient, with a history of chronic gastritis and polyps, presents for evaluation of persistently low iron levels. He has been taking over-the-counter iron supplements for the past week, which has resulted in dark stools but no constipation or stomach issues. Prior to this, he was not on any iron supplements. He has received multiple iron infusions from a hematologist due to the inefficiency of oral iron in raising his levels quickly. He has an upcoming appointment with the hematologist. He denies noticing any blood in his stool, changes in bowel habits, or weight changes.  The patient reports chronic pain in his feet and back due to his physically demanding job, which requires him  to be on his feet for ten hours a day. He takes Aleve, ibuprofen, and Goody powders daily for pain relief. He was recently  prescribed meloxicam, which he takes in addition to the over-the-counter pain relievers. He denies worsening heartburn or reflux, nausea, vomiting, and reports having daily bowel movements. He occasionally feels "wheezy" in his stomach, which he attributes to his coffee consumption.  The patient is also trying to quit smoking with the help of Chantix. He reports a cough and occasional regurgitation. He also mentions an itchy sensation in his anal area, which he has been managing with over-the-counter probiotics and cranberry pills.      She  reports that she has been smoking cigarettes. She started smoking about 35 years ago. She has a 17.9 pack-year smoking history. She has never used smokeless tobacco. She reports that she does not drink alcohol and does not use drugs.  RELEVANT LABS AND IMAGING:  Results   DIAGNOSTIC Endoscopy: Polyp in stomach removed, chronic gastritis (2023) Colonoscopy: AVM at cecum fixed, polyps (2023) Rectal Exam: Dark stool, negative for blood, internal hemorrhoids (01/24/2023)      CBC    Component Value Date/Time   WBC 10.4 08/05/2022 1040   RBC 5.63 (H) 08/05/2022 1040   HGB 14.1 08/05/2022 1040   HGB 14.5 04/25/2021 1344   HGB 11.8 10/17/2020 1609   HCT 43.6 08/05/2022 1040   HCT 39.1 10/17/2020 1609   PLT 388.0 08/05/2022 1040   PLT 339 04/25/2021 1344   PLT 296 10/17/2020 1609   MCV 77.4 (L) 08/05/2022 1040   MCV 70 (L) 10/17/2020 1609   MCH 25.2 (L) 11/14/2021 1805   MCHC 32.4 08/05/2022 1040   RDW 16.5 (H) 08/05/2022 1040   RDW 24.6 (H) 10/17/2020 1609   LYMPHSABS 2.1 04/25/2021 1344   LYMPHSABS 2.1 10/17/2020 1609   MONOABS 0.6 04/25/2021 1344   EOSABS 0.2 04/25/2021 1344   EOSABS 0.2 10/17/2020 1609   BASOSABS 0.0 04/25/2021 1344   BASOSABS 0.0 10/17/2020 1609   Recent Labs    08/05/22 1040  HGB 14.1    CMP     Component Value Date/Time   NA 142 01/03/2023 1402   NA 140 07/18/2020 1548   K 4.0 01/03/2023 1402   CL 108  01/03/2023 1402   CO2 24 01/03/2023 1402   GLUCOSE 70 01/03/2023 1402   BUN 12 01/03/2023 1402   BUN 12 07/18/2020 1548   CREATININE 0.74 01/03/2023 1402   CREATININE 0.57 04/25/2021 1344   CREATININE 0.74 12/27/2015 1551   CALCIUM 9.6 01/03/2023 1402   PROT 7.1 08/05/2022 1040   PROT 7.3 07/18/2020 1548   ALBUMIN 4.2 08/05/2022 1040   ALBUMIN 4.4 07/18/2020 1548   AST 13 08/05/2022 1040   AST 17 04/25/2021 1344   ALT 20 08/05/2022 1040   ALT 24 04/25/2021 1344   ALKPHOS 98 08/05/2022 1040   BILITOT 0.3 08/05/2022 1040   BILITOT 0.3 04/25/2021 1344   GFRNONAA >60 11/14/2021 1805   GFRNONAA >60 04/25/2021 1344   GFRNONAA 74 06/17/2015 0948   GFRAA 95 12/15/2019 1723   GFRAA 86 06/17/2015 0948      Latest Ref Rng & Units 08/05/2022   10:40 AM 11/14/2021    7:51 PM 04/25/2021    1:44 PM  Hepatic Function  Total Protein 6.0 - 8.3 g/dL 7.1  6.9  7.3   Albumin 3.5 - 5.2 g/dL 4.2  3.4  4.1   AST 0 - 37 U/L 13  16  17   ALT 0 - 35 U/L 20  26  24    Alk Phosphatase 39 - 117 U/L 98  94  105   Total Bilirubin 0.2 - 1.2 mg/dL 0.3  0.5  0.3   Bilirubin, Direct 0.0 - 0.2 mg/dL  <0.9        Current Medications:     Current Outpatient Medications (Respiratory):    albuterol (VENTOLIN HFA) 108 (90 Base) MCG/ACT inhaler, Inhale 2 puffs into the lungs every 6 (six) hours as needed for wheezing or shortness of breath.   tiotropium (SPIRIVA) 18 MCG inhalation capsule, Place 1 capsule (18 mcg total) into inhaler and inhale daily.  Current Outpatient Medications (Analgesics):    meloxicam (MOBIC) 7.5 MG tablet, Take 1 tablet (7.5 mg total) by mouth daily. With food  Current Outpatient Medications (Hematological):    Iron, Ferrous Sulfate, 325 (65 Fe) MG TABS, Take 325 mg by mouth daily.  Current Outpatient Medications (Other):    gabapentin (NEURONTIN) 100 MG capsule, Take 1 capsule (100 mg total) by mouth at bedtime.   nicotine (NICODERM CQ) 14 mg/24hr patch, Place 1 patch (14 mg  total) onto the skin daily.   pantoprazole (PROTONIX) 40 MG tablet, Take 1 tablet (40 mg total) by mouth daily.   varenicline (CHANTIX) 0.5 MG tablet, 1tab daily x 3days, then 1tab BID x 1week, then 2tabs BID continuously   Vitamin D, Ergocalciferol, (DRISDOL) 1.25 MG (50000 UNIT) CAPS capsule, Take 1 capsule (50,000 Units total) by mouth every 7 (seven) days.  Medical History:  Past Medical History:  Diagnosis Date   Anxiety    COPD (chronic obstructive pulmonary disease) (HCC)    Depression    Hyperlipidemia    Hypertension    no currently on bp meds 08/16/21   Iron deficiency anemia    Osteopenia    Substance abuse (HCC)    History of crack cocaine abuse.   Allergies: No Known Allergies   Surgical History:  She  has a past surgical history that includes Cesarean section; Breast surgery (1995); Abdominal hysterectomy (04/22/1993); Brain surgery; Colonoscopy with propofol (Elizabeth Best, 08/18/2020); Hot hemostasis (Elizabeth Best, 08/18/2020); enteroscopy (Elizabeth Best, 08/18/2020); biopsy (08/18/2020); Hemostasis clip placement (08/18/2020); and polypectomy (08/18/2020). Family History:  Her family history includes Diabetes in her paternal grandmother; Heart disease in her father; Hyperlipidemia in her father; Hypertension in her mother; Tuberculosis in her sister.  REVIEW OF SYSTEMS  : All other systems reviewed and negative except where noted in the History of Present Illness.  PHYSICAL EXAM: BP 126/70   Pulse 81   Ht 5\' 3"  (1.6 m)   Wt 189 lb 12.8 oz (86.1 kg)   SpO2 98%   BMI 33.62 kg/m  General Appearance: Well nourished, in no apparent distress. Head:   Normocephalic and atraumatic. Eyes:  sclerae anicteric,conjunctive pink  Respiratory: Respiratory effort normal, BS equal bilaterally without rales, rhonchi, wheezing. Cardio: RRR with no MRGs. Peripheral pulses intact.  Abdomen: Soft,  Obese ,active bowel sounds. No tenderness . Without guarding and Without rebound. No masses. Rectal: Normal external  rectal exam, normal rectal tone, appreciated internal hemorrhoids, non-tender, no masses, , brown stool, hemoccult Negative Musculoskeletal: Full ROM, Normal gait. Without edema. Skin:  Dry and intact without significant lesions or rashes Neuro: Alert and  oriented x4;  No focal deficits. Psych:  Cooperative. Normal mood and affect.    Doree Albee, PA-C 12:16 PM

## 2023-01-28 ENCOUNTER — Other Ambulatory Visit: Payer: Self-pay

## 2023-01-28 ENCOUNTER — Telehealth: Payer: Self-pay

## 2023-01-28 DIAGNOSIS — D509 Iron deficiency anemia, unspecified: Secondary | ICD-10-CM

## 2023-01-28 NOTE — Telephone Encounter (Signed)
Pt scheduled for capsule endo 02/14/23 at 8:30am. Amb ref in epic. Left message for pt to call back.

## 2023-01-28 NOTE — Telephone Encounter (Signed)
-----   Message from Doree Albee sent at 01/27/2023  6:25 PM EDT ----- Regarding: FW: I saw this patient today Can we set up for Capsule endoscopy for IDA?  I have no clue about availability but she is off Oct 25th if we can work some Pharmacist, community.  Thanks,  Marchelle Folks  ----- Message ----- From: Jenel Lucks, MD Sent: 01/27/2023   4:33 PM EDT To: Doree Albee, PA-C Subject: RE: I saw this patient today                   I think a capsule is reasonable since she had colonic AVMs treated already and we do not know what her small bowel AVM burden is.  If she has a lot of AVMs in the small intestine, it may not be worth putting her through another colonoscopy (because she would have bleeding sources we could not treat endoscopically).  If we don't see any AVMs in the small bowel, then repeat colonoscopy +/- enteroscopy would be recommended. ----- Message ----- From: Javier Glazier Sent: 01/24/2023   3:56 PM EDT To: Jenel Lucks, MD Subject: I saw this patient today                       I saw this patient today she has a history of IDA with colonoscopy and enteroscopy in 2022 she had AVM in the cecum and AVMs in jejunum in 2022 recent EGD with you negative H. pylori had polyps removed that were precancerous and gastric metaplasia no dysplasia, Presents again with IDA she has been on NSAIDs start her on Protonix No overt GI bleeding negative FOBT in the office which is reassuring She is getting of care with hematology IV iron on the 25th I think maybe she should get another colon enteroscopy with the previous 2022 but patient does not really want to do that she cannot miss her work she was may be willing to do a capsule endoscopy but I do not think that is what we should start with but wanted your opinion. Should we go for capsule endoscopy or tell patient she has to take a day off work and likely get the colonoscopy and enteroscopy with maybe capsule endoscopy afterwards Thanks

## 2023-01-29 NOTE — Telephone Encounter (Signed)
Spoke with pt and she is aware of capsule appt. Prep instructions sent to her via mychart.

## 2023-02-04 ENCOUNTER — Encounter: Payer: Self-pay | Admitting: Nurse Practitioner

## 2023-02-05 ENCOUNTER — Telehealth: Payer: Self-pay | Admitting: Nurse Practitioner

## 2023-02-05 ENCOUNTER — Other Ambulatory Visit: Payer: Self-pay | Admitting: Nurse Practitioner

## 2023-02-05 ENCOUNTER — Other Ambulatory Visit (HOSPITAL_COMMUNITY): Payer: Self-pay

## 2023-02-05 DIAGNOSIS — M51362 Other intervertebral disc degeneration, lumbar region with discogenic back pain and lower extremity pain: Secondary | ICD-10-CM

## 2023-02-05 DIAGNOSIS — R200 Anesthesia of skin: Secondary | ICD-10-CM | POA: Insufficient documentation

## 2023-02-05 DIAGNOSIS — M545 Low back pain, unspecified: Secondary | ICD-10-CM

## 2023-02-05 DIAGNOSIS — E559 Vitamin D deficiency, unspecified: Secondary | ICD-10-CM

## 2023-02-05 NOTE — Telephone Encounter (Signed)
See note

## 2023-02-11 NOTE — Progress Notes (Signed)
Agree with the assessment and plan as outlined by Quentin Mulling,  PA-C, but would suggest video capsule endoscopy prior to repeat enteroscopy/colonoscopy as this will allow assessment of AVM burden outside of the reach of standard endoscopic methods.  If numerous AVMs are found deep in the small intestine, patient may be more appropriate for balloon enteroscopy or medical therapy (octreotide).  Laryssa Hassing E. Tomasa Rand, MD

## 2023-02-12 ENCOUNTER — Other Ambulatory Visit: Payer: Self-pay

## 2023-02-12 DIAGNOSIS — D509 Iron deficiency anemia, unspecified: Secondary | ICD-10-CM

## 2023-02-14 ENCOUNTER — Ambulatory Visit: Payer: Medicaid Other | Admitting: Nurse Practitioner

## 2023-02-14 ENCOUNTER — Encounter: Payer: Self-pay | Admitting: *Deleted

## 2023-02-14 ENCOUNTER — Inpatient Hospital Stay: Payer: Medicaid Other | Attending: Hematology

## 2023-02-14 ENCOUNTER — Ambulatory Visit: Payer: Medicaid Other | Admitting: Gastroenterology

## 2023-02-14 ENCOUNTER — Other Ambulatory Visit: Payer: Self-pay

## 2023-02-14 ENCOUNTER — Inpatient Hospital Stay: Payer: Medicaid Other | Admitting: Hematology

## 2023-02-14 ENCOUNTER — Encounter: Payer: Self-pay | Admitting: Nurse Practitioner

## 2023-02-14 VITALS — BP 140/80 | HR 83 | Temp 98.0°F | Resp 18 | Wt 187.0 lb

## 2023-02-14 VITALS — BP 140/68 | HR 67 | Temp 97.2°F | Resp 20 | Wt 187.3 lb

## 2023-02-14 DIAGNOSIS — K2950 Unspecified chronic gastritis without bleeding: Secondary | ICD-10-CM | POA: Insufficient documentation

## 2023-02-14 DIAGNOSIS — F411 Generalized anxiety disorder: Secondary | ICD-10-CM

## 2023-02-14 DIAGNOSIS — G629 Polyneuropathy, unspecified: Secondary | ICD-10-CM

## 2023-02-14 DIAGNOSIS — F17209 Nicotine dependence, unspecified, with unspecified nicotine-induced disorders: Secondary | ICD-10-CM | POA: Diagnosis not present

## 2023-02-14 DIAGNOSIS — D509 Iron deficiency anemia, unspecified: Secondary | ICD-10-CM | POA: Diagnosis present

## 2023-02-14 DIAGNOSIS — D500 Iron deficiency anemia secondary to blood loss (chronic): Secondary | ICD-10-CM | POA: Diagnosis not present

## 2023-02-14 DIAGNOSIS — L811 Chloasma: Secondary | ICD-10-CM | POA: Diagnosis not present

## 2023-02-14 DIAGNOSIS — M79672 Pain in left foot: Secondary | ICD-10-CM

## 2023-02-14 DIAGNOSIS — F17210 Nicotine dependence, cigarettes, uncomplicated: Secondary | ICD-10-CM | POA: Diagnosis not present

## 2023-02-14 DIAGNOSIS — M79671 Pain in right foot: Secondary | ICD-10-CM

## 2023-02-14 DIAGNOSIS — I10 Essential (primary) hypertension: Secondary | ICD-10-CM

## 2023-02-14 DIAGNOSIS — B9681 Helicobacter pylori [H. pylori] as the cause of diseases classified elsewhere: Secondary | ICD-10-CM | POA: Diagnosis not present

## 2023-02-14 DIAGNOSIS — K5520 Angiodysplasia of colon without hemorrhage: Secondary | ICD-10-CM

## 2023-02-14 DIAGNOSIS — J439 Emphysema, unspecified: Secondary | ICD-10-CM

## 2023-02-14 LAB — CBC WITH DIFFERENTIAL (CANCER CENTER ONLY)
Abs Immature Granulocytes: 0.02 10*3/uL (ref 0.00–0.07)
Basophils Absolute: 0 10*3/uL (ref 0.0–0.1)
Basophils Relative: 0 %
Eosinophils Absolute: 0.1 10*3/uL (ref 0.0–0.5)
Eosinophils Relative: 2 %
HCT: 44.3 % (ref 36.0–46.0)
Hemoglobin: 13.9 g/dL (ref 12.0–15.0)
Immature Granulocytes: 0 %
Lymphocytes Relative: 19 %
Lymphs Abs: 1.7 10*3/uL (ref 0.7–4.0)
MCH: 24.7 pg — ABNORMAL LOW (ref 26.0–34.0)
MCHC: 31.4 g/dL (ref 30.0–36.0)
MCV: 78.7 fL — ABNORMAL LOW (ref 80.0–100.0)
Monocytes Absolute: 0.4 10*3/uL (ref 0.1–1.0)
Monocytes Relative: 5 %
Neutro Abs: 6.2 10*3/uL (ref 1.7–7.7)
Neutrophils Relative %: 74 %
Platelet Count: 368 10*3/uL (ref 150–400)
RBC: 5.63 MIL/uL — ABNORMAL HIGH (ref 3.87–5.11)
RDW: 17.4 % — ABNORMAL HIGH (ref 11.5–15.5)
WBC Count: 8.5 10*3/uL (ref 4.0–10.5)
nRBC: 0 % (ref 0.0–0.2)

## 2023-02-14 LAB — CMP (CANCER CENTER ONLY)
ALT: 19 U/L (ref 0–44)
AST: 14 U/L — ABNORMAL LOW (ref 15–41)
Albumin: 4.4 g/dL (ref 3.5–5.0)
Alkaline Phosphatase: 88 U/L (ref 38–126)
Anion gap: 6 (ref 5–15)
BUN: 8 mg/dL (ref 8–23)
CO2: 26 mmol/L (ref 22–32)
Calcium: 9.7 mg/dL (ref 8.9–10.3)
Chloride: 108 mmol/L (ref 98–111)
Creatinine: 0.72 mg/dL (ref 0.44–1.00)
GFR, Estimated: >60 mL/min (ref >60)
Glucose, Bld: 73 mg/dL (ref 70–99)
Potassium: 4.1 mmol/L (ref 3.5–5.1)
Sodium: 140 mmol/L (ref 135–145)
Total Bilirubin: 0.4 mg/dL (ref 0.3–1.2)
Total Protein: 7.8 g/dL (ref 6.5–8.1)

## 2023-02-14 LAB — IRON AND IRON BINDING CAPACITY (CC-WL,HP ONLY)
Iron: 62 ug/dL (ref 28–170)
Saturation Ratios: 15 % (ref 10.4–31.8)
TIBC: 419 ug/dL (ref 250–450)
UIBC: 357 ug/dL (ref 148–442)

## 2023-02-14 LAB — FERRITIN: Ferritin: 27 ng/mL (ref 11–307)

## 2023-02-14 LAB — VITAMIN B12: Vitamin B-12: 384 pg/mL (ref 180–914)

## 2023-02-14 MED ORDER — ESCITALOPRAM OXALATE 10 MG PO TABS: 5.0000 mg | ORAL_TABLET | Freq: Every day | ORAL | 2 refills | Status: DC

## 2023-02-14 MED ORDER — GABAPENTIN 100 MG PO CAPS: ORAL_CAPSULE | ORAL | 5 refills | Status: DC

## 2023-02-14 NOTE — Progress Notes (Signed)
Established Patient Visit  Patient: Elizabeth Best   DOB: [birth year]   67 y.o. Female  MRN: [id number] Visit Date: 02/14/2023  Subjective:    Chief Complaint  Patient presents with   office visit    PT is here for 1 month follow up tobacco cessation evaluation. Patient voiced smoking sensation has decreased a little but still have the urge.    HPI Essential hypertension Elevated today Asymptomatic BP Readings from Last 3 Encounters:  02/14/23 (!) 140/80  02/14/23 (!) 140/68  01/24/23 126/70    Advised to maintain DASH diet, monitor BP at home and send via mychart  on Monday.  Neuropathy Normal ABI DG lumbar spine 2021: Chronic bilateral pars interarticularis defects at L5 with grade2 anterolisthesis of L5 on S1, unchanged.Grade 1 anterolisthesis L4 on L5, progressed from prior. Some improvement with gabapentin 100mg  at hs. Meloxicam on hold due to GI bleed  DIABETES neuropathy vs lumbar radiculopathy. Increased gabapentin dose to 100mg  in AM and 200mg  in PM. Advised about risk of daytime drowsiness. Consider referral to neurology if no improvement.  Tobacco use disorder, continuous Decreased to 1/4ppd Does not use nicoderm patch Current use of chantix 0.5mg  BID x 4weeks. Denies any adverse effects.  Advised to increased chantix dose to 1mg  BID F/up in 50month  GAD (generalized anxiety disorder) Did not schedule appointment with therapist due to work schedule. Agreed to start lexapro 5mg  daily. F/up in 50month  Emphysema lung (HCC) She did no start spriva as prescribed due to misunderstanding on how to use device. Advised to return to office or pharmacy with inhaler for teaching and demonstration   Reviewed medical, surgical, and social history today  Medications: Outpatient Medications Prior to Visit  Medication Sig   albuterol (VENTOLIN HFA) 108 (90 Base) MCG/ACT inhaler Inhale 2 puffs into the lungs every 6 (six) hours as needed for wheezing or  shortness of breath.   hydrocortisone (ANUSOL-HC) 2.5 % rectal cream Place 1 Application rectally 2 (two) times daily.   nicotine (NICODERM CQ) 14 mg/24hr patch Place 1 patch (14 mg total) onto the skin daily.   pantoprazole (PROTONIX) 40 MG tablet Take 1 tablet (40 mg total) by mouth daily.   tiotropium (SPIRIVA) 18 MCG inhalation capsule Place 1 capsule (18 mcg total) into inhaler and inhale daily.   varenicline (CHANTIX) 0.5 MG tablet 1tab daily x 3days, then 1tab BID x 1week, then 2tabs BID continuously   Vitamin D, Ergocalciferol, (DRISDOL) 1.25 MG (50000 UNIT) CAPS capsule Take 1 capsule (50,000 Units total) by mouth every 7 (seven) days.   [DISCONTINUED] gabapentin (NEURONTIN) 100 MG capsule Take 1 capsule (100 mg total) by mouth at bedtime.   [DISCONTINUED] DENTA 5000 PLUS 1.1 % CREA dental cream Take by mouth 2 (two) times daily. (Patient not taking: Reported on 02/14/2023)   [DISCONTINUED] Iron, Ferrous Sulfate, 325 (65 Fe) MG TABS Take 325 mg by mouth daily. (Patient not taking: Reported on 02/14/2023)   [DISCONTINUED] meloxicam (MOBIC) 7.5 MG tablet Take 1 tablet (7.5 mg total) by mouth daily. With food (Patient not taking: Reported on 02/14/2023)   No facility-administered medications prior to visit.   Reviewed past medical and social history.   ROS per HPI above      Objective:  BP (!) 140/80   Pulse 83   Temp 98 F (36.7 C) (Temporal)   Resp 18   Wt 187 lb (84.8 kg)   SpO2  100%   BMI 33.13 kg/m      Physical Exam Vitals and nursing note reviewed.  Cardiovascular:     Rate and Rhythm: Normal rate.     Pulses: Normal pulses.  Pulmonary:     Effort: Pulmonary effort is normal.  Musculoskeletal:     Right lower leg: No edema.     Left lower leg: No edema.  Neurological:     Mental Status: She is alert and oriented to person, place, and time.  Psychiatric:        Attention and Perception: Attention normal.        Mood and Affect: Mood is anxious.         Speech: Speech normal.        Behavior: Behavior is cooperative.        Cognition and Memory: Cognition and memory normal.        Judgment: Judgment normal.     Results for orders placed or performed in visit on 02/14/23  CBC with Differential (Cancer Center Only)  Result Value Ref Range   WBC Count 8.5 4.0 - 10.5 K/uL   RBC 5.63 (H) 3.87 - 5.11 MIL/uL   Hemoglobin 13.9 12.0 - 15.0 g/dL   HCT 01.0 27.2 - 53.6 %   MCV 78.7 (L) 80.0 - 100.0 fL   MCH 24.7 (L) 26.0 - 34.0 pg   MCHC 31.4 30.0 - 36.0 g/dL   RDW 64.4 (H) 03.4 - 74.2 %   Platelet Count 368 150 - 400 K/uL   nRBC 0.0 0.0 - 0.2 %   Neutrophils Relative % 74 %   Neutro Abs 6.2 1.7 - 7.7 K/uL   Lymphocytes Relative 19 %   Lymphs Abs 1.7 0.7 - 4.0 K/uL   Monocytes Relative 5 %   Monocytes Absolute 0.4 0.1 - 1.0 K/uL   Eosinophils Relative 2 %   Eosinophils Absolute 0.1 0.0 - 0.5 K/uL   Basophils Relative 0 %   Basophils Absolute 0.0 0.0 - 0.1 K/uL   Immature Granulocytes 0 %   Abs Immature Granulocytes 0.02 0.00 - 0.07 K/uL  CMP (Cancer Center only)  Result Value Ref Range   Sodium 140 135 - 145 mmol/L   Potassium 4.1 3.5 - 5.1 mmol/L   Chloride 108 98 - 111 mmol/L   CO2 26 22 - 32 mmol/L   Glucose, Bld 73 70 - 99 mg/dL   BUN 8 8 - 23 mg/dL   Creatinine 5.95 6.38 - 1.00 mg/dL   Calcium 9.7 8.9 - 75.6 mg/dL   Total Protein 7.8 6.5 - 8.1 g/dL   Albumin 4.4 3.5 - 5.0 g/dL   AST 14 (L) 15 - 41 U/L   ALT 19 0 - 44 U/L   Alkaline Phosphatase 88 38 - 126 U/L   Total Bilirubin 0.4 0.3 - 1.2 mg/dL   GFR, Estimated >43 >32 mL/min   Anion gap 6 5 - 15  Iron and Iron Binding Capacity ([facility] only)  Result Value Ref Range   Iron 62 28 - 170 ug/dL   TIBC 951 884 - 166 ug/dL   Saturation Ratios 15 10.4 - 31.8 %   UIBC 357 148 - 442 ug/dL  Vitamin A63  Result Value Ref Range   Vitamin B-12 384 180 - 914 pg/mL      Assessment & Plan:    Problem List Items Addressed This Visit     Emphysema lung (HCC)    She did  no start spriva  as prescribed due to misunderstanding on how to use device. Advised to return to office or pharmacy with inhaler for teaching and demonstration      Essential hypertension    Elevated today Asymptomatic BP Readings from Last 3 Encounters:  02/14/23 (!) 140/80  02/14/23 (!) 140/68  01/24/23 126/70    Advised to maintain DASH diet, monitor BP at home and send via mychart  on Monday.      GAD (generalized anxiety disorder)    Did not schedule appointment with therapist due to work schedule. Agreed to start lexapro 5mg  daily. F/up in 53month      Relevant Medications   escitalopram (LEXAPRO) 10 MG tablet   Neuropathy    Normal ABI DG lumbar spine 2021: Chronic bilateral pars interarticularis defects at L5 with grade2 anterolisthesis of L5 on S1, unchanged.Grade 1 anterolisthesis L4 on L5, progressed from prior. Some improvement with gabapentin 100mg  at hs. Meloxicam on hold due to GI bleed  DIABETES neuropathy vs lumbar radiculopathy. Increased gabapentin dose to 100mg  in AM and 200mg  in PM. Advised about risk of daytime drowsiness. Consider referral to neurology if no improvement.      Tobacco use disorder, continuous    Decreased to 1/4ppd Does not use nicoderm patch Current use of chantix 0.5mg  BID x 4weeks. Denies any adverse effects.  Advised to increased chantix dose to 1mg  BID F/up in 53month      Other Visit Diagnoses     Melasma    -  Primary   Relevant Orders   Ambulatory referral to Dermatology   Foot pain, bilateral       Relevant Medications   gabapentin (NEURONTIN) 100 MG capsule      Return in about 4 weeks (around 03/14/2023) for depression and anxiety, tobacco use, HTN.     Alysia Penna, NP
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
## 2023-02-14 NOTE — Assessment & Plan Note (Signed)
Did not schedule appointment with therapist due to work schedule. Agreed to start lexapro 5mg  daily. F/up in 67month

## 2023-02-14 NOTE — Patient Instructions (Signed)
You may have clear liquids beginning at 10:30 am after ingesting the capsule.    You can have a light lunch at 12:30 pm; sandwich and half bowl of soup.  Return to the office at 4 pm to return the equipment.   Return to you normal diet at 5 pm.   Call [phone] and ask for Latoyna Hird BSN RN if you have any questions.  You should pass the capsule in your stool 8-48 hours after ingestion. If you have not passed the capsule, after 72 hours, please contact the office at [phone].    

## 2023-02-14 NOTE — Assessment & Plan Note (Signed)
She did no start spriva as prescribed due to misunderstanding on how to use device. Advised to return to office or pharmacy with inhaler for teaching and demonstration

## 2023-02-14 NOTE — Assessment & Plan Note (Signed)
Decreased to 1/4ppd Does not use nicoderm patch Current use of chantix 0.5mg  BID x 4weeks. Denies any adverse effects.  Advised to increased chantix dose to 1mg  BID F/up in 71month

## 2023-02-14 NOTE — Progress Notes (Signed)
SN: [id number] Exp: 03/12/2024 LOT: [id number]  Patient arrived for VCE. Reported the prep went well. This RN explained capsule dietary restrictions for the next few hours. Pt advised to return at 4 pm to return capsule equipment.  Patient verbalized understanding. Opened capsule, ensured capsule was flashing prior to the patient swallowing the capsule. Patient swallowed capsule without difficulty.  Patient told to call the office with any questions and if capsule has not passed after 72 hours. No further questions by the conclusion of the visit.

## 2023-02-14 NOTE — Assessment & Plan Note (Signed)
Elevated today Asymptomatic BP Readings from Last 3 Encounters:  02/14/23 (!) 140/80  02/14/23 (!) 140/68  01/24/23 126/70    Advised to maintain DASH diet, monitor BP at home and send via mychart  on Monday.

## 2023-02-14 NOTE — Patient Instructions (Addendum)
Increase Chantix to 2tabs 2x/day. Increase gabapentin to 200mg  in Pm and 100mg  in AM. Hold AM dose if you experience daytime drowsiness. Start lexapro 1/2tab daily Monitor BP at home in AM. Send BP readings via mychart on Monday

## 2023-02-14 NOTE — Assessment & Plan Note (Addendum)
Normal ABI DG lumbar spine 2021: Chronic bilateral pars interarticularis defects at L5 with grade2 anterolisthesis of L5 on S1, unchanged.Grade 1 anterolisthesis L4 on L5, progressed from prior. Some improvement with gabapentin 100mg  at hs. Meloxicam on hold due to GI bleed  DIABETES neuropathy vs lumbar radiculopathy. Increased gabapentin dose to 100mg  in AM and 200mg  in PM. Advised about risk of daytime drowsiness. Consider referral to neurology if no improvement.

## 2023-02-16 ENCOUNTER — Encounter: Payer: Self-pay | Admitting: Nurse Practitioner

## 2023-02-16 ENCOUNTER — Ambulatory Visit (HOSPITAL_COMMUNITY)
Admission: EM | Admit: 2023-02-16 | Discharge: 2023-02-16 | Disposition: A | Discharge status: Home / Self Care | Payer: Medicaid Other | Attending: Family Medicine | Admitting: Family Medicine

## 2023-02-16 ENCOUNTER — Encounter (HOSPITAL_COMMUNITY): Payer: Self-pay

## 2023-02-16 DIAGNOSIS — R42 Dizziness and giddiness: Secondary | ICD-10-CM | POA: Diagnosis not present

## 2023-02-16 DIAGNOSIS — R5383 Other fatigue: Secondary | ICD-10-CM

## 2023-02-16 DIAGNOSIS — R030 Elevated blood-pressure reading, without diagnosis of hypertension: Secondary | ICD-10-CM | POA: Diagnosis not present
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
## 2023-02-16 NOTE — Discharge Instructions (Signed)
You were seen today for elevated blood pressure and not feeling well.  Your blood pressure is slightly elevated, but not concerning today.  I do recommend you continue to monitor and follow up with your primary care provider.  Your EKG was normal.  I think some of your symptoms area likely due to lack of sleep, and dehydration.  I do recommend you go home and get plenty of rest, and increase your fluid intake.  If not improving or worsening then please return or go to the ER for further evaluation.

## 2023-02-16 NOTE — ED Triage Notes (Signed)
Elevated blood presure, lightheaded x 3 days. Patient states 2 years ago was on BP meds, was taken off the meds. States the BP was high when seen on Friday by her pcp.

## 2023-02-17 ENCOUNTER — Telehealth: Payer: Self-pay | Admitting: Hematology

## 2023-02-17 NOTE — Telephone Encounter (Signed)
Per Candise Che 10/25 los left patient a message in reguards to next scheduled follow up appointment; left callback if needing to reschedule appointment

## 2023-02-20 ENCOUNTER — Encounter: Payer: Self-pay | Admitting: Hematology

## 2023-02-21 ENCOUNTER — Telehealth: Payer: Self-pay

## 2023-02-21 NOTE — Telephone Encounter (Signed)
Dr. Candise Che, patient will be scheduled as soon as possible.  Auth Submission: NO AUTH NEEDED Site of care: Site of care: [facility] Payer: [employer] Medication & CPT/J Code(s) submitted: Monoferric (Ferrci derisomaltose) [phone] Route of submission (phone, fax, portal): fax Phone # Fax # Auth type: Buy/Bill PB Units/visits requested: 1000mg  x 1 dose Reference number:  Approval from: 02/21/23 to 04/22/23   Insurance confirmed by fax that a prior Berkley Harvey is not required. I will put that in the media tab.

## 2023-02-23 ENCOUNTER — Encounter: Payer: Self-pay | Admitting: Nurse Practitioner

## 2023-02-25 ENCOUNTER — Encounter: Payer: Self-pay | Admitting: Hematology

## 2023-02-25 ENCOUNTER — Ambulatory Visit (INDEPENDENT_AMBULATORY_CARE_PROVIDER_SITE_OTHER): Payer: Medicare PPO

## 2023-02-25 VITALS — BP 137/76 | HR 68 | Temp 97.7°F | Resp 18 | Ht 63.0 in | Wt 188.8 lb

## 2023-02-25 DIAGNOSIS — D509 Iron deficiency anemia, unspecified: Secondary | ICD-10-CM

## 2023-02-25 DIAGNOSIS — D500 Iron deficiency anemia secondary to blood loss (chronic): Secondary | ICD-10-CM

## 2023-02-25 MED ORDER — LORATADINE 10 MG PO TABS
10.0000 mg | ORAL_TABLET | Freq: Once | ORAL | Status: AC
Start: 2023-02-25 — End: 2023-02-25
  Administered 2023-02-25: 10 mg via ORAL
  Filled 2023-02-25: qty 1

## 2023-02-25 MED ORDER — SODIUM CHLORIDE 0.9 % IV SOLN
1000.0000 mg | Freq: Once | INTRAVENOUS | Status: AC
  Administered 2023-02-25: 1000 mg via INTRAVENOUS
  Filled 2023-02-25: qty 10

## 2023-02-25 MED ORDER — ACETAMINOPHEN 325 MG PO TABS
650.0000 mg | ORAL_TABLET | Freq: Once | ORAL | Status: AC
  Administered 2023-02-25: 650 mg via ORAL
  Filled 2023-02-25: qty 2

## 2023-02-25 NOTE — Progress Notes (Signed)
Diagnosis: Iron Deficiency Anemia  Provider:  Chilton Greathouse MD  Procedure: IV Infusion  IV Type: Peripheral, IV Location: L Forearm  Monoferric (Ferric Derisomaltose), Dose: 1000 mg  Infusion Start Time: 0858  Infusion Stop Time: 0922  Post Infusion IV Care: Observation period completed and Peripheral IV Discontinued  Discharge: Condition: Good, Destination: Home . AVS Provided  Performed by:  Adriana Mccallum, RN

## 2023-02-25 NOTE — Patient Instructions (Signed)

## 2023-02-26 ENCOUNTER — Ambulatory Visit (INDEPENDENT_AMBULATORY_CARE_PROVIDER_SITE_OTHER): Payer: Medicare PPO | Admitting: Podiatry

## 2023-02-26 ENCOUNTER — Encounter: Payer: Self-pay | Admitting: Podiatry

## 2023-02-26 DIAGNOSIS — M79674 Pain in right toe(s): Secondary | ICD-10-CM | POA: Diagnosis not present

## 2023-02-26 DIAGNOSIS — B351 Tinea unguium: Secondary | ICD-10-CM | POA: Diagnosis not present

## 2023-02-26 DIAGNOSIS — M79675 Pain in left toe(s): Secondary | ICD-10-CM | POA: Diagnosis not present

## 2023-02-26 NOTE — Progress Notes (Signed)
This patient presents to the office with chief complaint of long thick painful nails.  Patient says the nails are painful walking and wearing shoes.  This patient is unable to self treat.  This patient is unable to trim her nails since she is unable to reach her nails.  She also has a callus on her left big toe. She presents to the office for preventative foot care services.  General Appearance  Alert, conversant and in no acute stress.  Vascular  Dorsalis pedis and posterior tibial  pulses are palpable  bilaterally.  Capillary return is within normal limits  bilaterally. Temperature is within normal limits  bilaterally.  Neurologic  Senn-Weinstein monofilament wire test within normal limits  bilaterally. Muscle power within normal limits bilaterally.  Nails Thick disfigured discolored nails with subungual debris  from hallux to fifth toes bilaterally. No evidence of bacterial infection or drainage bilaterally.  Orthopedic  No limitations of motion  feet .  No crepitus or effusions noted.  HAV  B/L.  Skin  normotropic skin with no porokeratosis noted bilaterally.  No signs of infections or ulcers noted.     Onychomycosis  Nails  B/L.  Pain in right toes  Pain in left toes  Debridement of nails both feet followed trimming the nails with dremel tool.    RTC 3 months.   Helane Gunther DPM

## 2023-02-26 NOTE — Telephone Encounter (Signed)
She was unable to talk because she was at an appointment. I advised to call the office after she is done with her appointment. When she calls back please advised to call [facility] for an appointment. Her referral was accurate because the neuropathy in her feet could be originating from her lumbar spine.

## 2023-02-26 NOTE — Progress Notes (Signed)
Patient's video capsule endoscopy was noted for rapid transit of the capsule, with the first cecal image at 35 minutes.  3 small nonbleeding AVMs were seen in the duodenum (13-14 minutes) a fourth AVM was seen at 23 minutes, thought to be in the jejunum or proximal ileum. Multiple AVMs without bleeding were noted in the colon.  As visibility in the colon was very limited, it was unclear how many distinct AVMs were seen, but there were at least 3 morphologically distinct AVMs noted.  As the patient's anemia and levels have been stable, we do not necessarily have to treat these lesions currently.  If her blood counts were to drop again, I would recommend a repeat push enteroscopy and colonoscopy in the hospital for ablation of the small bowel and colonic AVMs seen.

## 2023-02-27 ENCOUNTER — Telehealth: Payer: Self-pay | Admitting: Nurse Practitioner

## 2023-02-27 NOTE — Telephone Encounter (Signed)
Returned patient phone call and went over concerns. Patient verbalized understanding.

## 2023-02-27 NOTE — Telephone Encounter (Signed)
Pt returned your call.  

## 2023-02-28 ENCOUNTER — Encounter: Payer: Self-pay | Admitting: Gastroenterology

## 2023-03-03 ENCOUNTER — Encounter: Payer: Self-pay | Admitting: Hematology

## 2023-03-05 ENCOUNTER — Ambulatory Visit (INDEPENDENT_AMBULATORY_CARE_PROVIDER_SITE_OTHER): Payer: Medicaid Other | Admitting: Obstetrics & Gynecology

## 2023-03-05 ENCOUNTER — Encounter: Payer: Self-pay | Admitting: Obstetrics & Gynecology

## 2023-03-05 VITALS — BP 137/85 | HR 77 | Ht 63.0 in | Wt 188.0 lb

## 2023-03-05 DIAGNOSIS — R102 Pelvic and perineal pain: Secondary | ICD-10-CM | POA: Diagnosis not present

## 2023-03-05 DIAGNOSIS — R232 Flushing: Secondary | ICD-10-CM | POA: Diagnosis not present

## 2023-03-05 MED ORDER — MIRABEGRON ER 25 MG PO TB24: 25.0000 mg | ORAL_TABLET | Freq: Every day | ORAL | 4 refills | Status: DC

## 2023-03-05 MED ORDER — ESTRADIOL 1 MG PO TABS: 1.0000 mg | ORAL_TABLET | Freq: Every day | ORAL | 0 refills | Status: DC
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
## 2023-03-05 NOTE — Progress Notes (Signed)
67 y.o. New GYN presents for vaginal pain 8/10 x 2+ years.  Pt is not sexually active, for the past 11+ years.

## 2023-03-06 ENCOUNTER — Other Ambulatory Visit (HOSPITAL_COMMUNITY)
Admission: RE | Admit: 2023-03-06 | Discharge: 2023-03-06 | Disposition: A | Discharge status: Home / Self Care | Payer: Medicare PPO | Source: Ambulatory Visit | Attending: Obstetrics & Gynecology | Admitting: Obstetrics & Gynecology

## 2023-03-06 DIAGNOSIS — R102 Pelvic and perineal pain: Secondary | ICD-10-CM | POA: Diagnosis not present

## 2023-03-06 NOTE — Addendum Note (Signed)
Addended by: Maretta Bees on: 03/06/2023 11:50 AM   Modules accepted: Orders

## 2023-03-07 LAB — CERVICOVAGINAL ANCILLARY ONLY
Bacterial Vaginitis (gardnerella): NEGATIVE
Candida Glabrata: NEGATIVE
Candida Vaginitis: NEGATIVE
Chlamydia: NEGATIVE
Comment: NEGATIVE
Comment: NEGATIVE
Comment: NEGATIVE
Comment: NEGATIVE
Comment: NEGATIVE
Comment: NORMAL
Neisseria Gonorrhea: NEGATIVE
Trichomonas: NEGATIVE

## 2023-03-09 ENCOUNTER — Encounter: Payer: Self-pay | Admitting: Nurse Practitioner

## 2023-03-11 ENCOUNTER — Other Ambulatory Visit (HOSPITAL_COMMUNITY): Payer: Self-pay

## 2023-03-11 DIAGNOSIS — M5442 Lumbago with sciatica, left side: Secondary | ICD-10-CM | POA: Diagnosis not present

## 2023-03-11 DIAGNOSIS — G8929 Other chronic pain: Secondary | ICD-10-CM | POA: Diagnosis not present

## 2023-03-11 DIAGNOSIS — M5441 Lumbago with sciatica, right side: Secondary | ICD-10-CM | POA: Diagnosis not present

## 2023-04-23 ENCOUNTER — Ambulatory Visit (HOSPITAL_COMMUNITY)
Admission: EM | Admit: 2023-04-23 | Discharge: 2023-04-23 | Disposition: A | Discharge status: Home / Self Care | Payer: Medicare PPO | Attending: Emergency Medicine | Admitting: Emergency Medicine

## 2023-04-23 ENCOUNTER — Encounter (HOSPITAL_COMMUNITY): Payer: Self-pay

## 2023-04-23 DIAGNOSIS — J069 Acute upper respiratory infection, unspecified: Secondary | ICD-10-CM

## 2023-04-23 DIAGNOSIS — J441 Chronic obstructive pulmonary disease with (acute) exacerbation: Secondary | ICD-10-CM

## 2023-04-23 LAB — POC COVID19/FLU A&B COMBO
Covid Antigen, POC: NEGATIVE
Influenza A Antigen, POC: NEGATIVE
Influenza B Antigen, POC: NEGATIVE

## 2023-04-23 MED ORDER — PREDNISONE 20 MG PO TABS
40.0000 mg | ORAL_TABLET | Freq: Every day | ORAL | 0 refills | Status: AC
Start: 2023-04-23 — End: 2023-04-28

## 2023-04-23 MED ORDER — BENZONATATE 200 MG PO CAPS: 200.0000 mg | ORAL_CAPSULE | Freq: Three times a day (TID) | ORAL | 0 refills | Status: DC

## 2023-04-23 NOTE — Discharge Instructions (Addendum)
 You can use the Tessalon  Perles as needed for cough.  Start the steroids tomorrow with breakfast to help with any wheezing or shortness of breath.  Use your albuterol  inhaler as needed.  Please refill your Spiriva , you have 11 refills available at your [facility] pharmacy.  For sore throat you can do warm saline gargles, tea with honey and sleeping with a humidifier.  Over-the-counter cough drops can help sooth as well.  You take Tylenol  500 mg every 8 hours as needed for headache.  Your symptoms should improve over the next 5 to 7 days, if no improvement or any changes please return to clinic.
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
## 2023-04-23 NOTE — ED Triage Notes (Addendum)
 Pt presents with complaints of generalized body aches, sore throat, headache, and cough x 1 day. Pt denies fevers at home. Pt states she has taken Alka-Seltzer & Tylenol , rubbed Vicks Vapor-Rub on chest with little to no improvement in symptoms reported. Pt currently rates her overall discomfort an 8/10.

## 2023-04-25 ENCOUNTER — Encounter: Payer: Self-pay | Admitting: Nurse Practitioner

## 2023-05-02 ENCOUNTER — Encounter: Payer: Self-pay | Admitting: Nurse Practitioner

## 2023-05-02 ENCOUNTER — Ambulatory Visit (INDEPENDENT_AMBULATORY_CARE_PROVIDER_SITE_OTHER): Payer: Medicare PPO | Admitting: Nurse Practitioner

## 2023-05-02 VITALS — BP 139/77 | HR 70 | Temp 97.6°F | Resp 18 | Wt 191.0 lb

## 2023-05-02 DIAGNOSIS — F411 Generalized anxiety disorder: Secondary | ICD-10-CM | POA: Diagnosis not present

## 2023-05-02 DIAGNOSIS — Z6833 Body mass index (BMI) 33.0-33.9, adult: Secondary | ICD-10-CM

## 2023-05-02 DIAGNOSIS — M79671 Pain in right foot: Secondary | ICD-10-CM | POA: Diagnosis not present

## 2023-05-02 DIAGNOSIS — E6609 Other obesity due to excess calories: Secondary | ICD-10-CM

## 2023-05-02 DIAGNOSIS — J439 Emphysema, unspecified: Secondary | ICD-10-CM | POA: Diagnosis not present

## 2023-05-02 DIAGNOSIS — J441 Chronic obstructive pulmonary disease with (acute) exacerbation: Secondary | ICD-10-CM | POA: Diagnosis not present

## 2023-05-02 DIAGNOSIS — E66811 Obesity, class 1: Secondary | ICD-10-CM

## 2023-05-02 DIAGNOSIS — M79672 Pain in left foot: Secondary | ICD-10-CM | POA: Diagnosis not present

## 2023-05-02 DIAGNOSIS — F17209 Nicotine dependence, unspecified, with unspecified nicotine-induced disorders: Secondary | ICD-10-CM

## 2023-05-02 MED ORDER — VARENICLINE TARTRATE 0.5 MG PO TABS: ORAL_TABLET | ORAL | 1 refills | Status: DC

## 2023-05-02 MED ORDER — PROMETHAZINE-DM 6.25-15 MG/5ML PO SYRP: 5.0000 mL | ORAL_SOLUTION | Freq: Three times a day (TID) | ORAL | 0 refills | Status: DC | PRN

## 2023-05-02 MED ORDER — GABAPENTIN 100 MG PO CAPS: ORAL_CAPSULE | ORAL | 5 refills | Status: DC

## 2023-05-02 MED ORDER — DOXYCYCLINE HYCLATE 100 MG PO TABS: 100.0000 mg | ORAL_TABLET | Freq: Two times a day (BID) | ORAL | 0 refills | Status: AC

## 2023-05-02 MED ORDER — WEGOVY 0.25 MG/0.5ML ~~LOC~~ SOAJ: 0.2500 mg | SUBCUTANEOUS | 0 refills | Status: DC

## 2023-05-02 NOTE — Progress Notes (Signed)
 Acute Office Visit  Subjective:    Patient ID: Elizabeth Best, female    DOB: 1955-10-17, 68 y.o.   MRN: [id number]  Chief Complaint  Patient presents with   Medication Managment     RX refills needed. PT C/O of congestion for 1 week; prednisone  prescribed from ED visit 04/23/2023 but hasn't helped much.   Prevnar 20 vaccine is due    HPI Emphysema lung (HCC) Acute exacerbation since resuming tobacco use. She did not start spriva and albuterol  as prescribed She reports non productive x 10days. No improvement with prednisone  40mg  x 5days. She was unable to afford benzonatate . No improvement with Delsym OVER THE COUNTER. No SOB, no CP, no night sweats  Sent doxycycline  and promethazine  Dm Advised to resume spiriva  and albuterol  F/up in 358month  Obesity Reports with increased appetite with decreased tobacco use and increased stress. No exercise regimen at this time. Previous use of saxenda - med discontinued due to drug shortage. Hx of HYPERTENSION and atherosclerosis  Wt Readings from Last 3 Encounters:  05/02/23 191 lb (86.6 kg)  04/23/23 190 lb (86.2 kg)  03/05/23 188 lb (85.3 kg)    We discussed use of GLP-RA, possible side effects and contraindications. We discussed importance of diet modification and regular exercise in conjunction with medication. She verbalized understanding. Sent wegovy  0.25mg  F/up in 358month  GAD (generalized anxiety disorder) Declined to take lexapro  Declined referral to psychology.  Tobacco use disorder, continuous Quit tobacco use with of chantix  and nicotine  14mg  patch for 40days Resumed tobacco use due to increased stress,  She currently smokes about 1/4-1/2ppd.  Sent Chantix  0.5mg  Advised about need to set quit date. Advised to discontinue estrogen supplement F/up in 358month   Outpatient Medications Prior to Visit  Medication Sig Note   albuterol  (VENTOLIN  HFA) 108 (90 Base) MCG/ACT inhaler Inhale 2 puffs into the lungs every 6 (six)  hours as needed for wheezing or shortness of breath.    estradiol  (ESTRACE ) 1 MG tablet Take 1 tablet (1 mg total) by mouth daily.    hydrocortisone  (ANUSOL -HC) 2.5 % rectal cream Place 1 Application rectally 2 (two) times daily.    mirabegron  ER (MYRBETRIQ ) 25 MG TB24 tablet Take 1 tablet (25 mg total) by mouth daily.    tiotropium (SPIRIVA ) 18 MCG inhalation capsule Place 1 capsule (18 mcg total) into inhaler and inhale daily.    Vitamin D , Ergocalciferol , (DRISDOL ) 1.25 MG (50000 UNIT) CAPS capsule Take 1 capsule (50,000 Units total) by mouth every 7 (seven) days.    [DISCONTINUED] benzonatate  (TESSALON ) 200 MG capsule Take 1 capsule (200 mg total) by mouth every 8 (eight) hours.    [DISCONTINUED] gabapentin  (NEURONTIN ) 100 MG capsule 1cap in Am and 2caps in PM 05/02/2023: Refills needed    [DISCONTINUED] nicotine  (NICODERM CQ ) 14 mg/24hr patch Place 1 patch (14 mg total) onto the skin daily. 05/02/2023: Refills needed    [DISCONTINUED] varenicline  (CHANTIX ) 0.5 MG tablet 1tab daily x 3days, then 1tab BID x 1week, then 2tabs BID continuously 05/02/2023: Refills needed    [DISCONTINUED] escitalopram  (LEXAPRO ) 10 MG tablet Take 0.5 tablets (5 mg total) by mouth daily.    [DISCONTINUED] pantoprazole  (PROTONIX ) 40 MG tablet Take 1 tablet (40 mg total) by mouth daily.    No facility-administered medications prior to visit.    Reviewed past medical and social history.  Review of Systems Per HPI     Objective:    Physical Exam Vitals and nursing note reviewed.  Constitutional:  Appearance: She is obese.  Cardiovascular:     Rate and Rhythm: Normal rate and regular rhythm.     Pulses: Normal pulses.     Heart sounds: Normal heart sounds.  Pulmonary:     Effort: Pulmonary effort is normal.     Breath sounds: Normal breath sounds.  Neurological:     Mental Status: She is alert and oriented to person, place, and time.    BP 139/77 (BP Location: Left Arm, Patient Position: Sitting, Cuff  Size: Large)   Pulse 70   Temp 97.6 F (36.4 C) (Temporal)   Resp 18   Wt 191 lb (86.6 kg)   SpO2 99%   BMI 33.83 kg/m    No results found for any visits on 05/02/23.     Assessment & Plan:   Problem List Items Addressed This Visit     Emphysema lung (HCC)   Acute exacerbation since resuming tobacco use. She did not start spriva and albuterol  as prescribed She reports non productive x 10days. No improvement with prednisone  40mg  x 5days. She was unable to afford benzonatate . No improvement with Delsym OVER THE COUNTER. No SOB, no CP, no night sweats  Sent doxycycline  and promethazine  Dm Advised to resume spiriva  and albuterol  F/up in 76month      Relevant Medications   varenicline  (CHANTIX ) 0.5 MG tablet   promethazine -dextromethorphan (PROMETHAZINE -DM) 6.25-15 MG/5ML syrup   GAD (generalized anxiety disorder) - Primary   Declined to take lexapro  Declined referral to psychology.      Obesity   Reports with increased appetite with decreased tobacco use and increased stress. No exercise regimen at this time. Previous use of saxenda - med discontinued due to drug shortage. Hx of HYPERTENSION and atherosclerosis  Wt Readings from Last 3 Encounters:  05/02/23 191 lb (86.6 kg)  04/23/23 190 lb (86.2 kg)  03/05/23 188 lb (85.3 kg)    We discussed use of GLP-RA, possible side effects and contraindications. We discussed importance of diet modification and regular exercise in conjunction with medication. She verbalized understanding. Sent wegovy  0.25mg  F/up in 76month      Relevant Medications   Semaglutide -Weight Management (WEGOVY ) 0.25 MG/0.5ML SOAJ   Tobacco use disorder, continuous   Quit tobacco use with of chantix  and nicotine  14mg  patch for 40days Resumed tobacco use due to increased stress,  She currently smokes about 1/4-1/2ppd.  Sent Chantix  0.5mg  Advised about need to set quit date. Advised to discontinue estrogen supplement F/up in 76month      Relevant  Medications   varenicline  (CHANTIX ) 0.5 MG tablet   Other Visit Diagnoses       Foot pain, bilateral       Relevant Medications   gabapentin  (NEURONTIN ) 100 MG capsule     COPD with acute exacerbation (HCC)       Relevant Medications   varenicline  (CHANTIX ) 0.5 MG tablet   doxycycline  (VIBRA -TABS) 100 MG tablet   promethazine -dextromethorphan (PROMETHAZINE -DM) 6.25-15 MG/5ML syrup      Meds ordered this encounter  Medications   Semaglutide -Weight Management (WEGOVY ) 0.25 MG/0.5ML SOAJ    Sig: Inject 0.25 mg into the skin once a week.    Dispense:  2 mL    Refill:  0    Supervising Provider:   BERNETA FALLOW ALFRED [[id number]]   gabapentin  (NEURONTIN ) 100 MG capsule    Sig: 1cap in Am and 2caps in PM    Dispense:  90 capsule    Refill:  5    Supervising Provider:  KREMER, WILLIAM ALFRED [[id number]]   varenicline  (CHANTIX ) 0.5 MG tablet    Sig: 1tab daily x 3days, then 1tab BID x 1week, then 2tabs BID continuously    Dispense:  90 tablet    Refill:  1    Supervising Provider:   BERNETA FALLOW ALFRED [[id number]]   doxycycline  (VIBRA -TABS) 100 MG tablet    Sig: Take 1 tablet (100 mg total) by mouth 2 (two) times daily for 7 days.    Dispense:  14 tablet    Refill:  0    Supervising Provider:   BERNETA FALLOW ALFRED [[id number]]   promethazine -dextromethorphan (PROMETHAZINE -DM) 6.25-15 MG/5ML syrup    Sig: Take 5 mLs by mouth 3 (three) times daily as needed for cough.    Dispense:  118 mL    Refill:  0    Supervising Provider:   BERNETA FALLOW SAYRE [[id number]]   Return in about 4 weeks (around 05/30/2023) for Weight management, tobacco use.    Roselie Mood, NP

## 2023-05-02 NOTE — Assessment & Plan Note (Addendum)
 Reports with increased appetite with decreased tobacco use and increased stress. No exercise regimen at this time. Previous use of saxenda - med discontinued due to drug shortage. Hx of HYPERTENSION and atherosclerosis  Wt Readings from Last 3 Encounters:  05/02/23 191 lb (86.6 kg)  04/23/23 190 lb (86.2 kg)  03/05/23 188 lb (85.3 kg)    We discussed use of GLP-RA, possible side effects and contraindications. We discussed importance of diet modification and regular exercise in conjunction with medication. She verbalized understanding. Sent wegovy  0.25mg  F/up in 80month

## 2023-05-02 NOTE — Assessment & Plan Note (Addendum)
 Quit tobacco use with of chantix  and nicotine  14mg  patch for 40days Resumed tobacco use due to increased stress,  She currently smokes about 1/4-1/2ppd.  Sent Chantix  0.5mg  Advised about need to set quit date. Advised to discontinue estrogen supplement F/up in 66month

## 2023-05-02 NOTE — Patient Instructions (Signed)
 How to Increase Your Level of Physical Activity Getting regular physical activity is important for your overall health and well-being. Most people do not get enough exercise. There are easy ways to increase your level of physical activity, even if you have not been very active in the past or if you are just starting out. What are the benefits of physical activity? Physical activity has many short-term and long-term benefits. Being active on a regular basis can improve your physical and mental health as well as provide other benefits. Physical health benefits Helping you lose weight or maintain a healthy weight. Strengthening your muscles and bones. Reducing your risk of certain long-term (chronic) diseases, including heart disease, cancer, and diabetes. Being able to move around more easily and for longer periods of time without getting tired (increased endurance or stamina). Improving your ability to fight off illness (enhanced immunity). Being able to sleep better. Helping you stay healthy as you get older, including: Helping you stay mobile, or capable of walking and moving around. Preventing accidents, such as falls. Increasing life expectancy. Mental health benefits Boosting your mood and improving your self-esteem. Lowering your chance of having mental health problems, such as depression or anxiety. Helping you feel good about your body. Other benefits Finding new sources of fun and enjoyment. Meeting new people who share a common interest. Before you begin If you have a chronic illness or have not been active for a while, check with your health care provider about how to get started. Ask your health care provider what activities are safe for you. Start out slowly. Walking or doing some simple chair exercises is a good place to start, especially if you have not been active before or for a long time. Set goals that you can work toward. Ask your health care provider how much exercise  is best for you. In general, most adults should: Do moderate-intensity exercise for at least 150 minutes each week (30 minutes on most days of the week) or vigorous exercise for at least 75 minutes each week, or a combination of these. Moderate-intensity exercise can include walking at a quick pace, biking, yoga, water aerobics, or gardening. Vigorous exercise involves activities that take more effort, such as jogging or running, playing sports, swimming laps, or jumping rope. Do strength exercises on at least 2 days each week. This can include weight lifting, body weight exercises, and resistance-band exercises. How to be more physically active Make a plan  Try to find activities that you enjoy. You are more likely to commit to an exercise routine if it does not feel like a chore. If you have bone or joint problems, choose low-impact exercises, like walking or swimming. Use these tips for being successful with an exercise plan: Find a workout partner for accountability. Join a group or class, such as an aerobics class, cycling class, or sports team. Make family time active. Go for a walk, bike, or swim. Include a variety of exercises each week. Consider using a fitness tracker, such as a mobile phone app or a device worn like a watch, that will count the number of steps you take each day. Many people strive to reach 10,000 steps a day. Find ways to be active in your daily routines Besides your formal exercise plans, you can find ways to do physical activity during your daily routines, such as: Walking or biking to work or to the store. Taking the stairs instead of the elevator. Parking farther away from the door at work  or at the store. Planning walking meetings. Walking around while you are on the phone. Where to find more information Centers for Disease Control and Prevention: CampusCasting.com.pt President's Council on Fitness, Sports & Nutrition: www.fitness.gov ChooseMyPlate:  http://www.harvey.com/ Contact a health care provider if: You have headaches, muscle aches, or joint pain that is concerning. You feel dizzy or light-headed while exercising. You faint. You feel your heart skipping, racing, or fluttering. You have chest pain while exercising. Summary Exercise benefits your mind and body at any age, even if you are just starting out. If you have a chronic illness or have not been active for a while, check with your health care provider before increasing your physical activity. Choose activities that are safe and enjoyable for you. Ask your health care provider what activities are safe for you. Start slowly. Tell your health care provider if you have problems as you start to increase your activity level. This information is not intended to replace advice given to you by your health care provider. Make sure you discuss any questions you have with your health care provider. Document Revised: 08/04/2020 Document Reviewed: 08/04/2020 Elsevier Patient Education  2024 Elsevier Inc.  Calorie Counting for [employer] Calories are units of energy. Your body needs a certain number of calories from food to keep going throughout the day. When you eat or drink more calories than your body needs, your body stores the extra calories mostly as fat. When you eat or drink fewer calories than your body needs, your body burns fat to get the energy it needs. Calorie counting means keeping track of how many calories you eat and drink each day. Calorie counting can be helpful if you need to lose weight. If you eat fewer calories than your body needs, you should lose weight. Ask your health care provider what a healthy weight is for you. For calorie counting to work, you will need to eat the right number of calories each day to lose a healthy amount of weight per week. A dietitian can help you figure out how many calories you need in a day and will suggest ways to reach your calorie goal. A  healthy amount of weight to lose each week is usually 1-2 lb (0.5-0.9 kg). This usually means that your daily calorie intake should be reduced by 500-750 calories. Eating 1,200-1,500 calories a day can help most women lose weight. Eating 1,500-1,800 calories a day can help most men lose weight. What do I need to know about calorie counting? Work with your health care provider or dietitian to determine how many calories you should get each day. To meet your daily calorie goal, you will need to: Find out how many calories are in each food that you would like to eat. Try to do this before you eat. Decide how much of the food you plan to eat. Keep a food log. Do this by writing down what you ate and how many calories it had. To successfully lose weight, it is important to balance calorie counting with a healthy lifestyle that includes regular activity. Where do I find calorie information?  The number of calories in a food can be found on a Nutrition Facts label. If a food does not have a Nutrition Facts label, try to look up the calories online or ask your dietitian for help. Remember that calories are listed per serving. If you choose to have more than one serving of a food, you will have to multiply the calories per  serving by the number of servings you plan to eat. For example, the label on a package of bread might say that a serving size is 1 slice and that there are 90 calories in a serving. If you eat 1 slice, you will have eaten 90 calories. If you eat 2 slices, you will have eaten 180 calories. How do I keep a food log? After each time that you eat, record the following in your food log as soon as possible: What you ate. Be sure to include toppings, sauces, and other extras on the food. How much you ate. This can be measured in cups, ounces, or number of items. How many calories were in each food and drink. The total number of calories in the food you ate. Keep your food log near you, such as  in a pocket-sized notebook or on an app or website on your mobile phone. Some programs will calculate calories for you and show you how many calories you have left to meet your daily goal. What are some portion-control tips? Know how many calories are in a serving. This will help you know how many servings you can have of a certain food. Use a measuring cup to measure serving sizes. You could also try weighing out portions on a kitchen scale. With time, you will be able to estimate serving sizes for some foods. Take time to put servings of different foods on your favorite plates or in your favorite bowls and cups so you know what a serving looks like. Try not to eat straight from a food's packaging, such as from a bag or box. Eating straight from the package makes it hard to see how much you are eating and can lead to overeating. Put the amount you would like to eat in a cup or on a plate to make sure you are eating the right portion. Use smaller plates, glasses, and bowls for smaller portions and to prevent overeating. Try not to multitask. For example, avoid watching TV or using your computer while eating. If it is time to eat, sit down at a table and enjoy your food. This will help you recognize when you are full. It will also help you be more mindful of what and how much you are eating. What are tips for following this plan? Reading food labels Check the calorie count compared with the serving size. The serving size may be smaller than what you are used to eating. Check the source of the calories. Try to choose foods that are high in protein, fiber, and vitamins, and low in saturated fat, trans fat, and sodium. Shopping Read nutrition labels while you shop. This will help you make healthy decisions about which foods to buy. Pay attention to nutrition labels for low-fat or fat-free foods. These foods sometimes have the same number of calories or more calories than the full-fat versions. They also  often have added sugar, starch, or salt to make up for flavor that was removed with the fat. Make a grocery list of lower-calorie foods and stick to it. Cooking Try to cook your favorite foods in a healthier way. For example, try baking instead of frying. Use low-fat dairy products. Meal planning Use more fruits and vegetables. One-half of your plate should be fruits and vegetables. Include lean proteins, such as chicken, [place], and fish. Lifestyle Each week, aim to do one of the following: 150 minutes of moderate exercise, such as walking. 75 minutes of vigorous exercise, such as running.  General information Know how many calories are in the foods you eat most often. This will help you calculate calorie counts faster. Find a way of tracking calories that works for you. Get creative. Try different apps or programs if writing down calories does not work for you. What foods should I eat?  Eat nutritious foods. It is better to have a nutritious, high-calorie food, such as an avocado, than a food with few nutrients, such as a bag of potato chips. Use your calories on foods and drinks that will fill you up and will not leave you hungry soon after eating. Examples of foods that fill you up are nuts and nut butters, vegetables, lean proteins, and high-fiber foods such as whole grains. High-fiber foods are foods with more than 5 g of fiber per serving. Pay attention to calories in drinks. Low-calorie drinks include water and unsweetened drinks. The items listed above may not be a complete list of foods and beverages you can eat. Contact a dietitian for more information. What foods should I limit? Limit foods or drinks that are not good sources of vitamins, minerals, or protein or that are high in unhealthy fats. These include: Candy. Other sweets. Sodas, specialty coffee drinks, alcohol, and juice. The items listed above may not be a complete list of foods and beverages you should avoid. Contact  a dietitian for more information. How do I count calories when eating out? Pay attention to portions. Often, portions are much larger when eating out. Try these tips to keep portions smaller: Consider sharing a meal instead of getting your own. If you get your own meal, eat only half of it. Before you start eating, ask for a container and put half of your meal into it. When available, consider ordering smaller portions from the menu instead of full portions. Pay attention to your food and drink choices. Knowing the way food is cooked and what is included with the meal can help you eat fewer calories. If calories are listed on the menu, choose the lower-calorie options. Choose dishes that include vegetables, fruits, whole grains, low-fat dairy products, and lean proteins. Choose items that are boiled, broiled, grilled, or steamed. Avoid items that are buttered, battered, fried, or served with cream sauce. Items labeled as crispy are usually fried, unless stated otherwise. Choose water, low-fat milk, unsweetened iced tea, or other drinks without added sugar. If you want an alcoholic beverage, choose a lower-calorie option, such as a glass of wine or light beer. Ask for dressings, sauces, and syrups on the side. These are usually high in calories, so you should limit the amount you eat. If you want a salad, choose a garden salad and ask for grilled meats. Avoid extra toppings such as bacon, cheese, or fried items. Ask for the dressing on the side, or ask for olive oil and vinegar or lemon to use as dressing. Estimate how many servings of a food you are given. Knowing serving sizes will help you be aware of how much food you are eating at restaurants. Where to find more information Centers for Disease Control and Prevention: FootballExhibition.com.br U.S. Department of Agriculture: WrestlingReporter.dk Summary Calorie counting means keeping track of how many calories you eat and drink each day. If you eat fewer calories than  your body needs, you should lose weight. A healthy amount of weight to lose per week is usually 1-2 lb (0.5-0.9 kg). This usually means reducing your daily calorie intake by 500-750 calories. The number of calories in  a food can be found on a Nutrition Facts label. If a food does not have a Nutrition Facts label, try to look up the calories online or ask your dietitian for help. Use smaller plates, glasses, and bowls for smaller portions and to prevent overeating. Use your calories on foods and drinks that will fill you up and not leave you hungry shortly after a meal. This information is not intended to replace advice given to you by your health care provider. Make sure you discuss any questions you have with your health care provider. Document Revised: 05/20/2019 Document Reviewed: 05/20/2019 Elsevier Patient Education  2023 [employer].

## 2023-05-02 NOTE — Assessment & Plan Note (Signed)
 Declined to take lexapro Declined referral to psychology.

## 2023-05-02 NOTE — Assessment & Plan Note (Addendum)
 Acute exacerbation since resuming tobacco use. She did not start spriva and albuterol  as prescribed She reports non productive x 10days. No improvement with prednisone  40mg  x 5days. She was unable to afford benzonatate . No improvement with Delsym OVER THE COUNTER. No SOB, no CP, no night sweats  Sent doxycycline  and promethazine  Dm Advised to resume spiriva  and albuterol  F/up in 23month

## 2023-05-06 ENCOUNTER — Telehealth: Payer: Self-pay

## 2023-05-06 ENCOUNTER — Other Ambulatory Visit: Payer: Self-pay | Admitting: Nurse Practitioner

## 2023-05-06 ENCOUNTER — Encounter: Payer: Self-pay | Admitting: Nurse Practitioner

## 2023-05-06 ENCOUNTER — Other Ambulatory Visit (HOSPITAL_COMMUNITY): Payer: Self-pay

## 2023-05-06 DIAGNOSIS — E66811 Obesity, class 1: Secondary | ICD-10-CM

## 2023-05-06 NOTE — Telephone Encounter (Signed)
 Pharmacy Patient Advocate Encounter   Received notification from CoverMyMeds that prior authorization for Wegovy  0.25MG /0.5ML auto-injectors is required/requested.   Insurance verification completed.   The patient is insured through [facility] .   Per test claim: PA required; PA started via CoverMyMeds. KEY [id number] . Waiting for clinical questions to populate.

## 2023-05-07 ENCOUNTER — Encounter: Payer: Self-pay | Admitting: Hematology

## 2023-05-07 ENCOUNTER — Other Ambulatory Visit (HOSPITAL_COMMUNITY): Payer: Self-pay

## 2023-05-07 NOTE — Telephone Encounter (Signed)
 Clinical questions answered and PA submitted.

## 2023-05-08 ENCOUNTER — Encounter: Payer: Self-pay | Admitting: Nurse Practitioner

## 2023-05-09 NOTE — Telephone Encounter (Signed)
Pharmacy Patient Advocate Encounter  Received notification from [facility] that Prior Authorization for [facility] 0.25MG /0.5ML auto-injectors has been DENIED.  No reason given; No denial letter received via Fax or CMM. It has been requested and will be uploaded to the media tab once received.   CMM Key#: [id number]

## 2023-05-12 ENCOUNTER — Other Ambulatory Visit (HOSPITAL_COMMUNITY): Payer: Self-pay

## 2023-05-14 ENCOUNTER — Telehealth: Payer: Self-pay

## 2023-05-14 ENCOUNTER — Other Ambulatory Visit (HOSPITAL_COMMUNITY): Payer: Self-pay

## 2023-05-14 ENCOUNTER — Encounter: Payer: Self-pay | Admitting: Nurse Practitioner

## 2023-05-14 DIAGNOSIS — E6609 Other obesity due to excess calories: Secondary | ICD-10-CM

## 2023-05-14 NOTE — Telephone Encounter (Signed)
Pharmacy Patient Advocate Encounter  Received notification from [facility] that Prior Authorization for [facility] 0.25mg /0.47ml has been DENIED.  See denial reason below. No denial letter attached in CMM. Will attach denial letter to Media tab once received.

## 2023-05-16 ENCOUNTER — Encounter: Payer: Self-pay | Admitting: Nurse Practitioner

## 2023-05-16 NOTE — Addendum Note (Signed)
Addended by: Michaela Corner on: 05/16/2023 08:26 AM   Modules accepted: Orders

## 2023-05-22 ENCOUNTER — Encounter: Payer: Self-pay | Admitting: Hematology

## 2023-05-24 ENCOUNTER — Encounter: Payer: Self-pay | Admitting: Hematology

## 2023-05-29 ENCOUNTER — Encounter (INDEPENDENT_AMBULATORY_CARE_PROVIDER_SITE_OTHER): Payer: Self-pay | Admitting: Adult Health

## 2023-05-29 ENCOUNTER — Ambulatory Visit: Payer: Medicare PPO | Admitting: Podiatry

## 2023-05-29 ENCOUNTER — Ambulatory Visit (INDEPENDENT_AMBULATORY_CARE_PROVIDER_SITE_OTHER): Payer: Medicare PPO | Admitting: Adult Health

## 2023-05-29 VITALS — BP 134/82 | HR 81 | Temp 98.0°F | Ht 63.0 in | Wt 185.0 lb

## 2023-05-29 DIAGNOSIS — I10 Essential (primary) hypertension: Secondary | ICD-10-CM

## 2023-05-29 DIAGNOSIS — Z0289 Encounter for other administrative examinations: Secondary | ICD-10-CM

## 2023-05-29 DIAGNOSIS — E669 Obesity, unspecified: Secondary | ICD-10-CM

## 2023-05-29 DIAGNOSIS — G629 Polyneuropathy, unspecified: Secondary | ICD-10-CM | POA: Diagnosis not present

## 2023-05-29 DIAGNOSIS — Z720 Tobacco use: Secondary | ICD-10-CM | POA: Diagnosis not present

## 2023-05-29 DIAGNOSIS — Z6832 Body mass index (BMI) 32.0-32.9, adult: Secondary | ICD-10-CM | POA: Diagnosis not present

## 2023-05-29 DIAGNOSIS — F17209 Nicotine dependence, unspecified, with unspecified nicotine-induced disorders: Secondary | ICD-10-CM

## 2023-05-29 NOTE — Progress Notes (Signed)
 Office: [phone]  /  Fax: [phone]   Initial Visit  Elizabeth Best was seen in clinic today to evaluate for obesity. She is interested in losing weight to improve overall health and reduce the risk of weight related complications. She presents today to review program treatment options, initial physical assessment, and evaluation.     She was referred by: PCP  When asked what else they would like to accomplish? She states: Adopt healthier eating patterns, Improve energy levels and physical activity, Improve existing medical conditions, and Improve quality of life  Weight history: Steady weight gain since 2000  When asked how has your weight affected you? She states: Contributed to medical problems, Contributed to orthopedic problems or mobility issues, Having fatigue, Having poor endurance, and Problems with eating patterns  Some associated conditions: Hyperlipidemia and Vitamin D  Deficiency  Contributing factors: Disruption of circadian rhythm / sleep disordered breathing, Consumption of processed foods, Moderate to high levels of stress, Reduced physical activity, and Eating patterns  Weight promoting medications identified: None  Current nutrition plan: None  Current level of physical activity: Other: Recently joined [employer]- plans on using weekly  Current or previous pharmacotherapy: None  Response to medication: Never tried medications   Past medical history includes:   Past Medical History:  Diagnosis Date   Anxiety    Arthritis    Blood transfusion without reported diagnosis    COPD (chronic obstructive pulmonary disease) (HCC)    Depression    Dyspnea    Fibroid    Hyperlipidemia    Hypertension    no currently on bp meds 08/16/21   Iron  deficiency anemia    Osteopenia    Substance abuse (HCC)    History of crack cocaine abuse.     Objective:   BP 134/82   Pulse 81   Temp 98 F (36.7 C)   Ht 5' 3 (1.6 m)   Wt 185 lb (83.9 kg)   SpO2  99%   BMI 32.77 kg/m  She was weighed on the bioimpedance scale: Body mass index is 32.77 kg/m.  Peak Weight:190 , Body Fat%:185, Visceral Fat Rating:12, Weight trend over the last 12 months: Unchanged  General:  Alert, oriented and cooperative. Patient is in no acute distress.  Respiratory: Normal respiratory effort, no problems with respiration noted   Gait: able to ambulate independently  Mental Status: Normal mood and affect. Normal behavior. Normal judgment and thought content.   DIAGNOSTIC DATA REVIEWED:  BMET    Component Value Date/Time   NA 140 02/14/2023 1013   NA 140 07/18/2020 1548   K 4.1 02/14/2023 1013   CL 108 02/14/2023 1013   CO2 26 02/14/2023 1013   GLUCOSE 73 02/14/2023 1013   BUN 8 02/14/2023 1013   BUN 12 07/18/2020 1548   CREATININE 0.72 02/14/2023 1013   CREATININE 0.74 12/27/2015 1551   CALCIUM  9.7 02/14/2023 1013   GFRNONAA >60 02/14/2023 1013   GFRNONAA 74 06/17/2015 0948   GFRAA 95 12/15/2019 1723   GFRAA 86 06/17/2015 0948   Lab Results  Component Value Date   HGBA1C 5.4 01/03/2023   HGBA1C 5.8 (H) 07/18/2014   No results found for: INSULIN  CBC    Component Value Date/Time   WBC 8.5 02/14/2023 1013   WBC 9.8 01/24/2023 1241   RBC 5.63 (H) 02/14/2023 1013   HGB 13.9 02/14/2023 1013   HGB 11.8 10/17/2020 1609   HCT 44.3 02/14/2023 1013   HCT 39.1 10/17/2020 1609   PLT  368 02/14/2023 1013   PLT 296 10/17/2020 1609   MCV 78.7 (L) 02/14/2023 1013   MCV 70 (L) 10/17/2020 1609   MCH 24.7 (L) 02/14/2023 1013   MCHC 31.4 02/14/2023 1013   RDW 17.4 (H) 02/14/2023 1013   RDW 24.6 (H) 10/17/2020 1609   Iron /TIBC/Ferritin/ %Sat    Component Value Date/Time   IRON  62 02/14/2023 1013   IRON  48 10/17/2020 1609   TIBC 419 02/14/2023 1013   TIBC 370 10/17/2020 1609   FERRITIN 27 02/14/2023 1014   FERRITIN 327 (H) 10/17/2020 1609   IRONPCTSAT 15 02/14/2023 1013   IRONPCTSAT 6 (L) 01/03/2023 1402   Lipid Panel     Component Value  Date/Time   CHOL 229 (H) 08/05/2022 1040   CHOL 219 (H) 10/17/2020 1609   TRIG 189.0 (H) 08/05/2022 1040   HDL 33.90 (L) 08/05/2022 1040   HDL 34 (L) 10/17/2020 1609   CHOLHDL 7 08/05/2022 1040   VLDL 37.8 08/05/2022 1040   LDLCALC 157 (H) 08/05/2022 1040   LDLCALC 149 (H) 10/17/2020 1609   Hepatic Function Panel     Component Value Date/Time   PROT 7.8 02/14/2023 1013   PROT 7.3 07/18/2020 1548   ALBUMIN 4.4 02/14/2023 1013   ALBUMIN 4.4 07/18/2020 1548   AST 14 (L) 02/14/2023 1013   ALT 19 02/14/2023 1013   ALKPHOS 88 02/14/2023 1013   BILITOT 0.4 02/14/2023 1013   BILIDIR <0.1 11/14/2021 1951   IBILI NOT CALCULATED 11/14/2021 1951      Component Value Date/Time   TSH 0.54 01/03/2023 1402     Assessment and Plan:   Tobacco use disorder, continuous  Essential hypertension  Neuropathy  Obesity (BMI 30-39.9), Starting BMI 33.84  ESTABLISH WITH HWW    Obesity Treatment / Action Plan:  Patient will work on garnering support from family and friends to begin weight loss journey. Will work on eliminating or reducing the presence of highly palatable, calorie dense foods in the home. Will complete provided nutritional and psychosocial assessment questionnaire before the next appointment. Will be scheduled for indirect calorimetry to determine resting energy expenditure in a fasting state.  This will allow us  to create a reduced calorie, high-protein meal plan to promote loss of fat mass while preserving muscle mass. Counseled on the health benefits of losing 5%-15% of total body weight. Was counseled on nutritional approaches to weight loss and benefits of reducing processed foods and consuming plant-based foods and high quality protein as part of nutritional weight management. Was counseled on pharmacotherapy and role as an adjunct in weight management.   Obesity Education Performed Today:  She was weighed on the bioimpedance scale and results were discussed and  documented in the synopsis.  We discussed obesity as a disease and the importance of a more detailed evaluation of all the factors contributing to the disease.  We discussed the importance of long term lifestyle changes which include nutrition, exercise and behavioral modifications as well as the importance of customizing this to her specific health and social needs.  We discussed the benefits of reaching a healthier weight to alleviate the symptoms of existing conditions and reduce the risks of the biomechanical, metabolic and psychological effects of obesity.  Clayton Tassinari appears to be in the action stage of change and states they are ready to start intensive lifestyle modifications and behavioral modifications.  30 minutes was spent today on this visit including the above counseling, pre-visit chart review, and post-visit documentation.  Reviewed by clinician  on day of visit: allergies, medications, problem list, medical history, surgical history, family history, social history, and previous encounter notes pertinent to obesity diagnosis.  Jourdon Zimmerle d. Iasia Forcier, NP-C

## 2023-05-30 ENCOUNTER — Ambulatory Visit (INDEPENDENT_AMBULATORY_CARE_PROVIDER_SITE_OTHER): Payer: Medicare PPO | Admitting: Nurse Practitioner

## 2023-05-30 ENCOUNTER — Telehealth: Payer: Self-pay

## 2023-05-30 ENCOUNTER — Telehealth: Payer: Self-pay | Admitting: Pharmacist

## 2023-05-30 ENCOUNTER — Other Ambulatory Visit (HOSPITAL_COMMUNITY): Payer: Self-pay

## 2023-05-30 ENCOUNTER — Encounter: Payer: Self-pay | Admitting: Nurse Practitioner

## 2023-05-30 VITALS — BP 124/82 | HR 74 | Temp 97.8°F | Wt 191.6 lb

## 2023-05-30 DIAGNOSIS — E6609 Other obesity due to excess calories: Secondary | ICD-10-CM | POA: Diagnosis not present

## 2023-05-30 DIAGNOSIS — Z6833 Body mass index (BMI) 33.0-33.9, adult: Secondary | ICD-10-CM

## 2023-05-30 DIAGNOSIS — E7800 Pure hypercholesterolemia, unspecified: Secondary | ICD-10-CM

## 2023-05-30 DIAGNOSIS — I700 Atherosclerosis of aorta: Secondary | ICD-10-CM

## 2023-05-30 DIAGNOSIS — Z23 Encounter for immunization: Secondary | ICD-10-CM | POA: Diagnosis not present

## 2023-05-30 DIAGNOSIS — E66811 Obesity, class 1: Secondary | ICD-10-CM

## 2023-05-30 DIAGNOSIS — F17209 Nicotine dependence, unspecified, with unspecified nicotine-induced disorders: Secondary | ICD-10-CM | POA: Diagnosis not present

## 2023-05-30 MED ORDER — ATORVASTATIN CALCIUM 20 MG PO TABS: 20.0000 mg | ORAL_TABLET | Freq: Every evening | ORAL | 3 refills | Status: DC

## 2023-05-30 NOTE — Assessment & Plan Note (Addendum)
 Noted on CT chest/ABDOMEN 10/2021 Current tobacco use: states she is not ready to quit due to stressors at home. BP at goal No DIABETES Atorvastatin  sent

## 2023-05-30 NOTE — Telephone Encounter (Signed)
 Appeal has been submitted for Wegovy . Will advise when response is received, please be advised that most companies may take 30 days to make a decision. Appeal letter and supporting documentation have been faxed to [phone] on 05/30/2023 @3 :28pm.  Thank you, Devere Pandy, PharmD Clinical Pharmacist  [facility]  Direct Dial: [phone]

## 2023-05-30 NOTE — Telephone Encounter (Signed)
 While patient was checking out she inquired about a letter of appeal for wegovy ? Please advise

## 2023-05-30 NOTE — Progress Notes (Signed)
 Established Patient Visit  Patient: Elizabeth Best   DOB: 1955-12-01   68 y.o. Female  MRN: [id number] Visit Date: 05/30/2023  Subjective:    Chief Complaint  Patient presents with   Medical Management of Chronic Issues    4 weeks (around 05/30/2023) for Weight management, tobacco use.   HPI Aortic atherosclerosis (HCC) Noted on CT chest/ABDOMEN 10/2021 Current tobacco use: states she is not ready to quit due to stressors at home. BP at goal No DIABETES Atorvastatin  sent  Tobacco use disorder, continuous Did not start chantix  States she is not ready to quit due to stressors at home.  Obesity She had 1st appointment with weight management clinic and has f/up appointment on 06/25/2023. Insurance declined 1st PA for wegovy . Due to lack of documentation on implemented lifestyle modifications. She had no Fhx of thyroid  cancer, no personal hx of pancreatitis or gallbladder disease/stone or kidney disease. Wt Readings from Last 3 Encounters:  05/30/23 191 lb 9.6 oz (86.9 kg)  05/29/23 185 lb (83.9 kg)  05/02/23 191 lb (86.6 kg)    Sent message to pharmacy team for PA to be resubmitted.  Pure hypercholesterolemia Start atorvastatin  20mg  Repeat lipid panel in 3-41months  Reviewed medical, surgical, and social history today  Medications: Outpatient Medications Prior to Visit  Medication Sig   albuterol  (VENTOLIN  HFA) 108 (90 Base) MCG/ACT inhaler Inhale 2 puffs into the lungs every 6 (six) hours as needed for wheezing or shortness of breath.   estradiol  (ESTRACE ) 1 MG tablet Take 1 tablet (1 mg total) by mouth daily.   gabapentin  (NEURONTIN ) 100 MG capsule 1cap in Am and 2caps in PM   hydrocortisone  (ANUSOL -HC) 2.5 % rectal cream Place 1 Application rectally 2 (two) times daily.   mirabegron  ER (MYRBETRIQ ) 25 MG TB24 tablet Take 1 tablet (25 mg total) by mouth daily.   promethazine -dextromethorphan (PROMETHAZINE -DM) 6.25-15 MG/5ML syrup Take 5 mLs by mouth 3 (three)  times daily as needed for cough.   Semaglutide -Weight Management (WEGOVY ) 0.25 MG/0.5ML SOAJ Inject 0.25 mg into the skin once a week.   tiotropium (SPIRIVA ) 18 MCG inhalation capsule Place 1 capsule (18 mcg total) into inhaler and inhale daily.   Vitamin D , Ergocalciferol , (DRISDOL ) 1.25 MG (50000 UNIT) CAPS capsule Take 1 capsule (50,000 Units total) by mouth every 7 (seven) days.   [DISCONTINUED] varenicline  (CHANTIX ) 0.5 MG tablet 1tab daily x 3days, then 1tab BID x 1week, then 2tabs BID continuously   No facility-administered medications prior to visit.   Reviewed past medical and social history.   ROS per HPI above  Last metabolic panel Lab Results  Component Value Date   GLUCOSE 73 02/14/2023   NA 140 02/14/2023   K 4.1 02/14/2023   CL 108 02/14/2023   CO2 26 02/14/2023   BUN 8 02/14/2023   CREATININE 0.72 02/14/2023   GFRNONAA >60 02/14/2023   CALCIUM  9.7 02/14/2023   PROT 7.8 02/14/2023   ALBUMIN 4.4 02/14/2023   LABGLOB 2.9 07/18/2020   AGRATIO 1.5 07/18/2020   BILITOT 0.4 02/14/2023   ALKPHOS 88 02/14/2023   AST 14 (L) 02/14/2023   ALT 19 02/14/2023   ANIONGAP 6 02/14/2023   Last lipids Lab Results  Component Value Date   CHOL 229 (H) 08/05/2022   HDL 33.90 (L) 08/05/2022   LDLCALC 157 (H) 08/05/2022   TRIG 189.0 (H) 08/05/2022   CHOLHDL 7 08/05/2022   Last hemoglobin A1c Lab Results  Component Value Date   HGBA1C 5.4 01/03/2023   Last thyroid  functions Lab Results  Component Value Date   TSH 0.54 01/03/2023      Objective:  BP 124/82   Pulse 74   Temp 97.8 F (36.6 C) (Temporal)   Wt 191 lb 9.6 oz (86.9 kg)   SpO2 98%   BMI 33.94 kg/m      Physical Exam Vitals and nursing note reviewed.  Cardiovascular:     Rate and Rhythm: Normal rate.     Pulses: Normal pulses.  Pulmonary:     Effort: Pulmonary effort is normal.  Neurological:     Mental Status: She is oriented to person, place, and time.     No results found for any visits on  05/30/23.    Assessment & Plan:    Problem List Items Addressed This Visit     Aortic atherosclerosis (HCC)   Noted on CT chest/ABDOMEN 10/2021 Current tobacco use: states she is not ready to quit due to stressors at home. BP at goal No DIABETES Atorvastatin  sent      Relevant Medications   atorvastatin  (LIPITOR) 20 MG tablet   Obesity - Primary   She had 1st appointment with weight management clinic and has f/up appointment on 06/25/2023. Insurance declined 1st PA for wegovy . Due to lack of documentation on implemented lifestyle modifications. She had no Fhx of thyroid  cancer, no personal hx of pancreatitis or gallbladder disease/stone or kidney disease. Wt Readings from Last 3 Encounters:  05/30/23 191 lb 9.6 oz (86.9 kg)  05/29/23 185 lb (83.9 kg)  05/02/23 191 lb (86.6 kg)    Sent message to pharmacy team for PA to be resubmitted.      Pure hypercholesterolemia   Start atorvastatin  20mg  Repeat lipid panel in 3-36months      Relevant Medications   atorvastatin  (LIPITOR) 20 MG tablet   Tobacco use disorder, continuous   Did not start chantix  States she is not ready to quit due to stressors at home.      Other Visit Diagnoses       Immunization due       Relevant Orders   Pneumococcal conjugate vaccine 20-valent (Prevnar 20) (Completed)      Return in about 6 months (around 11/27/2023) for CPE (fasting).     Roselie Mood, NP

## 2023-05-30 NOTE — Assessment & Plan Note (Signed)
 She had 1st appointment with weight management clinic and has f/up appointment on 06/25/2023. Insurance declined 1st PA for wegovy . Due to lack of documentation on implemented lifestyle modifications. She had no Fhx of thyroid  cancer, no personal hx of pancreatitis or gallbladder disease/stone or kidney disease. Wt Readings from Last 3 Encounters:  05/30/23 191 lb 9.6 oz (86.9 kg)  05/29/23 185 lb (83.9 kg)  05/02/23 191 lb (86.6 kg)    Sent message to pharmacy team for PA to be resubmitted.

## 2023-05-30 NOTE — Telephone Encounter (Signed)
 Pharmacy Patient Advocate Encounter   Received notification from Physician's Office that prior authorization for Wegovy  0.25mg /0.37ml is required/requested.   Insurance verification completed.   The patient is insured through [employer] .   Per test claim: PA required; PA submitted to above mentioned insurance via CoverMyMeds Key/confirmation #/EOC [id number] Status is pending    Placed a call to the insurance and per the representative, an appeal will have to be submitted to do the previous denial.

## 2023-05-30 NOTE — Assessment & Plan Note (Signed)
 Did not start chantix  States she is not ready to quit due to stressors at home.

## 2023-05-30 NOTE — Assessment & Plan Note (Signed)
 Start atorvastatin  20mg  Repeat lipid panel in 3-32months

## 2023-06-02 NOTE — Telephone Encounter (Signed)
 I called patient and notified her that our prior team is working on an appeal for Wegovy .

## 2023-06-03 ENCOUNTER — Encounter: Payer: Self-pay | Admitting: Hematology

## 2023-06-05 ENCOUNTER — Other Ambulatory Visit (HOSPITAL_COMMUNITY): Payer: Self-pay

## 2023-06-05 NOTE — Telephone Encounter (Signed)
Pharmacy Patient Advocate Encounter  Received notification from [facility] that APPEAL for [facility] has been APPROVED from 04/23/23 to 04/21/24. Ran test claim, Copay is $801.90. This test claim was processed through [facility]- copay amounts may vary at other pharmacies due to pharmacy/plan contracts, or as the patient moves through the different stages of their insurance plan.   PA #/Case ID/Reference #: [id number]

## 2023-06-06 ENCOUNTER — Encounter: Payer: Self-pay | Admitting: Nurse Practitioner

## 2023-06-06 ENCOUNTER — Other Ambulatory Visit (HOSPITAL_COMMUNITY): Payer: Self-pay

## 2023-06-06 NOTE — Telephone Encounter (Signed)
I called and spoke with patient and notified her of message and she will call [facility] pharmacy to see how much it will cost her there.

## 2023-06-17 ENCOUNTER — Other Ambulatory Visit: Payer: Self-pay

## 2023-06-17 DIAGNOSIS — D509 Iron deficiency anemia, unspecified: Secondary | ICD-10-CM

## 2023-06-18 ENCOUNTER — Inpatient Hospital Stay (HOSPITAL_BASED_OUTPATIENT_CLINIC_OR_DEPARTMENT_OTHER): Payer: Medicare PPO | Admitting: Hematology

## 2023-06-18 ENCOUNTER — Telehealth: Payer: Self-pay | Admitting: Gastroenterology

## 2023-06-18 ENCOUNTER — Inpatient Hospital Stay: Payer: Medicare PPO | Attending: Hematology

## 2023-06-18 VITALS — BP 154/80 | HR 78 | Temp 97.5°F | Resp 18 | Wt 194.0 lb

## 2023-06-18 DIAGNOSIS — D509 Iron deficiency anemia, unspecified: Secondary | ICD-10-CM | POA: Diagnosis not present

## 2023-06-18 LAB — CBC WITH DIFFERENTIAL (CANCER CENTER ONLY)
Abs Immature Granulocytes: 0.02 10*3/uL (ref 0.00–0.07)
Basophils Absolute: 0 10*3/uL (ref 0.0–0.1)
Basophils Relative: 1 %
Eosinophils Absolute: 0.1 10*3/uL (ref 0.0–0.5)
Eosinophils Relative: 1 %
HCT: 44.5 % (ref 36.0–46.0)
Hemoglobin: 14 g/dL (ref 12.0–15.0)
Immature Granulocytes: 0 %
Lymphocytes Relative: 25 %
Lymphs Abs: 2.2 10*3/uL (ref 0.7–4.0)
MCH: 25.5 pg — ABNORMAL LOW (ref 26.0–34.0)
MCHC: 31.5 g/dL (ref 30.0–36.0)
MCV: 80.9 fL (ref 80.0–100.0)
Monocytes Absolute: 0.4 10*3/uL (ref 0.1–1.0)
Monocytes Relative: 5 %
Neutro Abs: 6.1 10*3/uL (ref 1.7–7.7)
Neutrophils Relative %: 68 %
Platelet Count: 390 10*3/uL (ref 150–400)
RBC: 5.5 MIL/uL — ABNORMAL HIGH (ref 3.87–5.11)
RDW: 16.2 % — ABNORMAL HIGH (ref 11.5–15.5)
WBC Count: 8.9 10*3/uL (ref 4.0–10.5)
nRBC: 0 % (ref 0.0–0.2)

## 2023-06-18 LAB — CMP (CANCER CENTER ONLY)
ALT: 20 U/L (ref 0–44)
AST: 13 U/L — ABNORMAL LOW (ref 15–41)
Albumin: 4.3 g/dL (ref 3.5–5.0)
Alkaline Phosphatase: 91 U/L (ref 38–126)
Anion gap: 6 (ref 5–15)
BUN: 9 mg/dL (ref 8–23)
CO2: 28 mmol/L (ref 22–32)
Calcium: 9.9 mg/dL (ref 8.9–10.3)
Chloride: 106 mmol/L (ref 98–111)
Creatinine: 0.9 mg/dL (ref 0.44–1.00)
GFR, Estimated: >60 mL/min (ref >60)
Glucose, Bld: 92 mg/dL (ref 70–99)
Potassium: 3.7 mmol/L (ref 3.5–5.1)
Sodium: 140 mmol/L (ref 135–145)
Total Bilirubin: 0.2 mg/dL (ref 0.0–1.2)
Total Protein: 7.5 g/dL (ref 6.5–8.1)

## 2023-06-18 LAB — FERRITIN: Ferritin: 69 ng/mL (ref 11–307)

## 2023-06-18 LAB — IRON AND IRON BINDING CAPACITY (CC-WL,HP ONLY)
Iron: 65 ug/dL (ref 28–170)
Saturation Ratios: 18 % (ref 10.4–31.8)
TIBC: 365 ug/dL (ref 250–450)
UIBC: 300 ug/dL (ref 148–442)

## 2023-06-18 LAB — VITAMIN B12: Vitamin B-12: 316 pg/mL (ref 180–914)
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
## 2023-06-18 NOTE — Telephone Encounter (Signed)
 Inbound call from patient, would like a follow up on endo capsule procedure, she states she saw her primary provider and he stated she would need a follow up. I advised patient of Dr. Milas Hock recommendation to proceed with colonoscopy if her blood count was to drop again. Patient wanted to speak to a nurse.

## 2023-06-19 NOTE — Telephone Encounter (Signed)
 Discussed with pt that the vce report mentioned a push entero/colonoscopy nonurgent and could be deferred as counts were stable. Pt verbalized understanding.

## 2023-06-24 ENCOUNTER — Encounter: Payer: Self-pay | Admitting: Hematology

## 2023-06-25 ENCOUNTER — Encounter (INDEPENDENT_AMBULATORY_CARE_PROVIDER_SITE_OTHER): Payer: Self-pay | Admitting: Family Medicine

## 2023-06-25 ENCOUNTER — Ambulatory Visit (INDEPENDENT_AMBULATORY_CARE_PROVIDER_SITE_OTHER): Payer: Medicare PPO | Admitting: Family Medicine

## 2023-06-25 VITALS — BP 134/82 | HR 73 | Ht 63.0 in | Wt 189.0 lb

## 2023-06-25 DIAGNOSIS — E669 Obesity, unspecified: Secondary | ICD-10-CM

## 2023-06-25 DIAGNOSIS — I10 Essential (primary) hypertension: Secondary | ICD-10-CM | POA: Diagnosis not present

## 2023-06-25 DIAGNOSIS — Z6833 Body mass index (BMI) 33.0-33.9, adult: Secondary | ICD-10-CM | POA: Diagnosis not present

## 2023-06-25 DIAGNOSIS — E559 Vitamin D deficiency, unspecified: Secondary | ICD-10-CM | POA: Diagnosis not present

## 2023-06-25 DIAGNOSIS — R739 Hyperglycemia, unspecified: Secondary | ICD-10-CM

## 2023-06-25 DIAGNOSIS — R0602 Shortness of breath: Secondary | ICD-10-CM | POA: Diagnosis not present

## 2023-06-25 DIAGNOSIS — Z1331 Encounter for screening for depression: Secondary | ICD-10-CM

## 2023-06-25 DIAGNOSIS — E785 Hyperlipidemia, unspecified: Secondary | ICD-10-CM

## 2023-06-25 DIAGNOSIS — R5383 Other fatigue: Secondary | ICD-10-CM | POA: Diagnosis not present

## 2023-06-25 DIAGNOSIS — E538 Deficiency of other specified B group vitamins: Secondary | ICD-10-CM | POA: Diagnosis not present

## 2023-06-25 DIAGNOSIS — E782 Mixed hyperlipidemia: Secondary | ICD-10-CM

## 2023-06-25 NOTE — Progress Notes (Signed)
 Office: [phone]  /  Fax: [phone]  WEIGHT SUMMARY AND BIOMETRICS  Anthropometric Measurements Height: 5\' 3"  (1.6 m) Weight: 189 lb (85.7 kg) BMI (Calculated): 33.49 Starting Weight: 189 lb Waist Measurement : 42 inches   Body Composition  Body Fat %: 41.3 % Fat Mass (lbs): 78.2 lbs Muscle Mass (lbs): 105.4 lbs Total Body Water (lbs): 71.2 lbs Visceral Fat Rating : 12   Other Clinical Data RMR: 1411 Fasting: yes Labs: yes Today's Visit #: 1 Starting Date: 06/25/23    Chief Complaint: OBESITY   History of Present Illness   Elizabeth Best is a 68 year old female who presents for workup and treatment plan for obesity.  She has experienced weight gain over the past 12 years, attributing it to decreased activity, aging, and food choices.  Her current weight is 189 pounds, with a highest recorded weight of 198 pounds, and her goal weight is 130 pounds.  She has not participated in formal weight loss programs or surgeries but is considering weight loss surgery.  An indirect calorimeter test shows her resting energy expenditure to be 1411, with an estimated BMR of 1526, indicating a lower than expected metabolism.  Her diet consists primarily of takeout or fast food, and she snacks frequently on candy, cake, cookies, and chips.  She skips breakfast daily and drinks regular soda.  She identifies her worst food habits as consuming candy, cookies, and soda, and struggles with portion control and emotional eating.  She is interested in trying a vegetarian diet for health reasons but enjoys meat, particularly beef, shrimp, and fish. She is lactose intolerant but occasionally drinks 2% milk with cereal.  She exercises occasionally, using a treadmill once every couple of weeks for about 30 minutes. She does not wear a fitness tracker and does not like to cook, feeling she is not a good cook. She occasionally wakes up in the middle of the night hungry and does not consider  herself a picky eater.  She has a history of hypertension, hyperlipidemia, vitamin B12 deficiency, and vitamin D deficiency. She experiences fatigue and mild shortness of breath with exercise. Her modified PHQ-9 mood and food score is elevated at 14, and her Epworth Sleepiness score is normal at 4. 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   DIAGNOSTIC PHQ-9: 14 Epworth Sleepiness Scale: 4 Indirect Calorimeter: Resting energy expenditure: 1411          PHYSICAL EXAM:  Blood pressure 134/82, pulse 73, height 5\' 3"  (1.6 m), weight 189 lb (85.7 kg), SpO2 99%. Body mass index is 33.48 kg/m.  DIAGNOSTIC DATA REVIEWED:  BMET    Component Value Date/Time   NA 140 06/18/2023 1302   NA 140 07/18/2020 1548   K 3.7 06/18/2023 1302   CL 106 06/18/2023 1302   CO2 28 06/18/2023 1302   GLUCOSE 92 06/18/2023 1302   BUN 9 06/18/2023 1302   BUN 12 07/18/2020 1548   CREATININE 0.90 06/18/2023 1302   CREATININE 0.74 12/27/2015 1551   CALCIUM 9.9 06/18/2023 1302   GFRNONAA >60 06/18/2023 1302   GFRNONAA 74 06/17/2015 0948   GFRAA 95 12/15/2019 1723   GFRAA 86 06/17/2015 0948   Lab Results  Component Value Date   HGBA1C 5.4 01/03/2023   HGBA1C 5.8 (H) 07/18/2014   No results found for: "INSULIN" Lab Results  Component Value Date   TSH 0.54 01/03/2023   CBC    Component Value Date/Time   WBC 8.9 06/18/2023  1302   WBC 9.8 01/24/2023 1241   RBC 5.50 (H) 06/18/2023 1302   HGB 14.0 06/18/2023 1302   HGB 11.8 10/17/2020 1609   HCT 44.5 06/18/2023 1302   HCT 39.1 10/17/2020 1609   PLT 390 06/18/2023 1302   PLT 296 10/17/2020 1609   MCV 80.9 06/18/2023 1302   MCV 70 (L) 10/17/2020 1609   MCH 25.5 (L) 06/18/2023 1302   MCHC 31.5 06/18/2023 1302   RDW 16.2 (H) 06/18/2023 1302   RDW 24.6 (H) 10/17/2020 1609   Iron Studies    Component Value Date/Time   IRON 65 06/18/2023 1302   IRON 48 10/17/2020 1609   TIBC 365 06/18/2023 1302   TIBC 370 10/17/2020 1609    FERRITIN 69 06/18/2023 1303   FERRITIN 327 (H) 10/17/2020 1609   IRONPCTSAT 18 06/18/2023 1302   IRONPCTSAT 6 (L) 01/03/2023 1402   Lipid Panel     Component Value Date/Time   CHOL 229 (H) 08/05/2022 1040   CHOL 219 (H) 10/17/2020 1609   TRIG 189.0 (H) 08/05/2022 1040   HDL 33.90 (L) 08/05/2022 1040   HDL 34 (L) 10/17/2020 1609   CHOLHDL 7 08/05/2022 1040   VLDL 37.8 08/05/2022 1040   LDLCALC 157 (H) 08/05/2022 1040   LDLCALC 149 (H) 10/17/2020 1609   Hepatic Function Panel     Component Value Date/Time   PROT 7.5 06/18/2023 1302   PROT 7.3 07/18/2020 1548   ALBUMIN 4.3 06/18/2023 1302   ALBUMIN 4.4 07/18/2020 1548   AST 13 (L) 06/18/2023 1302   ALT 20 06/18/2023 1302   ALKPHOS 91 06/18/2023 1302   BILITOT 0.2 06/18/2023 1302   BILIDIR <0.1 11/14/2021 1951   IBILI NOT CALCULATED 11/14/2021 1951      Component Value Date/Time   TSH 0.54 01/03/2023 1402   Nutritional Lab Results  Component Value Date   VD25OH 20.35 (L) 01/03/2023   VD25OH 18.16 (L) 08/05/2022   VD25OH 25.42 (L) 12/19/2021     Assessment and Plan    Obesity Obesity with a BMI of 33.5 (height 5'3", weight 189 lbs). No previous formal weight loss programs. Considering weight loss surgery. Struggles with poor eating habits, emotional eating, and portion control. Goal weight is 130 lbs. Discussed tailored nutrition, minimal cooking, lean meats, avoiding high-fat red meats, light mayonnaise, and protein shakes. Emphasized home meal preparation, using a food scale, and avoiding juice, cereal, and oatmeal. Plan will be adjusted based on workup results and feedback. - Start category two eating plan - Avoid eating out and prepare meals at home - Use a food scale to measure protein portions - Limit intake of high-fat red meats and opt for lean cuts - Use light mayonnaise and protein shakes as substitutes for high-calorie condiments - Avoid juice, cereal, and oatmeal for now - Follow up in two weeks to review  lab results and eating plan  Fatigue and Exercise-Induced Shortness of Breath Reports fatigue and mild shortness of breath with exercise, likely related to obesity and fitness level. Dietary changes and weight loss plan expected to improve symptoms. - Follow up in two weeks to assess impact of dietary changes and weight loss on symptoms  Hypertension Hypertension. Dietary changes and weight loss plan expected to positively impact blood pressure control. - Monitor blood pressure regularly - Follow up in two weeks to assess impact of dietary changes on blood pressure  Hyperlipidemia Hyperlipidemia. Dietary changes and weight loss plan expected to positively impact lipid levels. - Follow up in  two weeks to assess impact of dietary changes on lipid levels  Vitamin B12 Deficiency -Check labs  Vitamin D Deficiency -Check labs.  General Health Maintenance Considering a vegetarian diet but unsure about giving up meat. Current plan includes meat; further dietary adjustments will be made based on workup results. Discussed balanced nutrition and portion control. - Evaluate the suitability of a vegetarian diet after initial workup - Encourage regular physical activity - Educate on the importance of balanced nutrition and portion control  Follow-up - Follow up in two weeks to review lab and test results - Adjust eating plan based on workup results and patient feedback.         I have personally spent 45 minutes total time today in preparation, patient care, and documentation for this visit, including the following: review of clinical lab tests; review of medical tests/procedures/services.    She was informed of the importance of frequent follow up visits to maximize her success with intensive lifestyle modifications for her multiple health conditions.    Quillian Quince, MD

## 2023-06-27 LAB — TSH: TSH: 0.633 u[IU]/mL (ref 0.450–4.500)

## 2023-06-27 LAB — INSULIN, RANDOM: INSULIN: 17.4 u[IU]/mL (ref 2.6–24.9)

## 2023-06-27 LAB — FOLATE: Folate: 6.2 ng/mL (ref >3.0)

## 2023-06-27 LAB — VITAMIN B12: Vitamin B-12: 497 pg/mL (ref 232–1245)

## 2023-06-27 LAB — VITAMIN D 25 HYDROXY (VIT D DEFICIENCY, FRACTURES): Vit D, 25-Hydroxy: 24 ng/mL — ABNORMAL LOW (ref 30.0–100.0)

## 2023-06-27 LAB — HEMOGLOBIN A1C
Est. average glucose Bld gHb Est-mCnc: 111 mg/dL
Hgb A1c MFr Bld: 5.5 % (ref 4.8–5.6)

## 2023-06-27 LAB — T3: T3, Total: 164 ng/dL (ref 71–180)

## 2023-06-27 LAB — MAGNESIUM: Magnesium: 2.2 mg/dL (ref 1.6–2.3)

## 2023-06-27 LAB — T4, FREE: Free T4: 1.08 ng/dL (ref 0.82–1.77)

## 2023-07-09 ENCOUNTER — Ambulatory Visit (INDEPENDENT_AMBULATORY_CARE_PROVIDER_SITE_OTHER): Payer: Medicare PPO | Admitting: Family Medicine

## 2023-07-09 ENCOUNTER — Encounter (INDEPENDENT_AMBULATORY_CARE_PROVIDER_SITE_OTHER): Payer: Self-pay | Admitting: Family Medicine

## 2023-07-09 VITALS — BP 136/77 | HR 71 | Temp 98.0°F | Ht 63.0 in | Wt 188.0 lb

## 2023-07-09 DIAGNOSIS — Z6833 Body mass index (BMI) 33.0-33.9, adult: Secondary | ICD-10-CM | POA: Insufficient documentation

## 2023-07-09 DIAGNOSIS — F5089 Other specified eating disorder: Secondary | ICD-10-CM

## 2023-07-09 DIAGNOSIS — E669 Obesity, unspecified: Secondary | ICD-10-CM

## 2023-07-09 DIAGNOSIS — E785 Hyperlipidemia, unspecified: Secondary | ICD-10-CM

## 2023-07-09 DIAGNOSIS — Z6834 Body mass index (BMI) 34.0-34.9, adult: Secondary | ICD-10-CM | POA: Insufficient documentation

## 2023-07-09 DIAGNOSIS — F3289 Other specified depressive episodes: Secondary | ICD-10-CM

## 2023-07-09 DIAGNOSIS — I700 Atherosclerosis of aorta: Secondary | ICD-10-CM

## 2023-07-09 DIAGNOSIS — E7800 Pure hypercholesterolemia, unspecified: Secondary | ICD-10-CM

## 2023-07-09 DIAGNOSIS — E559 Vitamin D deficiency, unspecified: Secondary | ICD-10-CM | POA: Diagnosis not present

## 2023-07-09 MED ORDER — BUPROPION HCL ER (SR) 150 MG PO TB12: 150.0000 mg | ORAL_TABLET | Freq: Every day | ORAL | 0 refills | Status: DC

## 2023-07-09 MED ORDER — VITAMIN D (ERGOCALCIFEROL) 1.25 MG (50000 UNIT) PO CAPS: 50000.0000 [IU] | ORAL_CAPSULE | ORAL | 0 refills | Status: DC

## 2023-07-09 NOTE — Progress Notes (Signed)
 Office: [phone]  /  Fax: [phone]  WEIGHT SUMMARY AND BIOMETRICS  Anthropometric Measurements Height: 5\' 3"  (1.6 m) Weight: 188 lb (85.3 kg) BMI (Calculated): 33.31 Weight at Last Visit: 189 lb Weight Lost Since Last Visit: 1 lb Weight Gained Since Last Visit: 0 Starting Weight: 189 lb Total Weight Loss (lbs): 1 lb (0.454 kg)   Body Composition  Body Fat %: 40.5 % Fat Mass (lbs): 76.4 lbs Muscle Mass (lbs): 106.4 lbs Total Body Water (lbs): 72.2 lbs Visceral Fat Rating : 12   Other Clinical Data Fasting: No Labs: No Today's Visit #: 2 Starting Date: 06/25/23    Chief Complaint: OBESITY    History of Present Illness   Elizabeth Best is a 68 year old female who presents for a follow-up visit regarding obesity management.  She is following a category two eating plan but is unsure of her adherence. She exercises on a treadmill for 30 minutes four times a week and has lost one pound over the last two weeks. She wants to start [facility] but cannot afford it due to high out-of-pocket costs.  She faces significant challenges with dietary changes, finding it difficult to alter lifelong eating habits. She avoids fast food and fried foods, reduces her intake of sweets, and occasionally indulges in foods like apple cobbler. She has switched from beef to [place] products and bakes her food instead of frying it. Despite these efforts, she finds her weight loss progress discouraging. Emotional eating behaviors, particularly in response to stress, are noted. Exercise increases her hunger, which she finds counterproductive. She dislikes unsweetened tea with Splenda and is unsure if protein bars and shakes are effective.  She has a history of aortic atherosclerosis and hyperlipidemia, for which she takes Lipitor 20 mg daily and follows a low cholesterol diet. Recent lab work showed a vitamin D deficiency, and she has osteoporosis.  She is living on social security and experiences  financial stress, impacting her ability to afford healthier food options.  She feels tired and sluggish, which may be related to her low vitamin D levels. No soda consumption, but she enjoys sweet tea.          PHYSICAL EXAM:  Blood pressure 136/77, pulse 71, temperature 98 F (36.7 C), height 5\' 3"  (1.6 m), weight 188 lb (85.3 kg), SpO2 97%. Body mass index is 33.3 kg/m.  DIAGNOSTIC DATA REVIEWED:  BMET    Component Value Date/Time   NA 140 06/18/2023 1302   NA 140 07/18/2020 1548   K 3.7 06/18/2023 1302   CL 106 06/18/2023 1302   CO2 28 06/18/2023 1302   GLUCOSE 92 06/18/2023 1302   BUN 9 06/18/2023 1302   BUN 12 07/18/2020 1548   CREATININE 0.90 06/18/2023 1302   CREATININE 0.74 12/27/2015 1551   CALCIUM 9.9 06/18/2023 1302   GFRNONAA >60 06/18/2023 1302   GFRNONAA 74 06/17/2015 0948   GFRAA 95 12/15/2019 1723   GFRAA 86 06/17/2015 0948   Lab Results  Component Value Date   HGBA1C 5.5 06/25/2023   HGBA1C 5.8 (H) 07/18/2014   Lab Results  Component Value Date   INSULIN 17.4 06/25/2023   Lab Results  Component Value Date   TSH 0.633 06/25/2023   CBC    Component Value Date/Time   WBC 8.9 06/18/2023 1302   WBC 9.8 01/24/2023 1241   RBC 5.50 (H) 06/18/2023 1302   HGB 14.0 06/18/2023 1302   HGB 11.8 10/17/2020 1609   HCT 44.5 06/18/2023 1302  HCT 39.1 10/17/2020 1609   PLT 390 06/18/2023 1302   PLT 296 10/17/2020 1609   MCV 80.9 06/18/2023 1302   MCV 70 (L) 10/17/2020 1609   MCH 25.5 (L) 06/18/2023 1302   MCHC 31.5 06/18/2023 1302   RDW 16.2 (H) 06/18/2023 1302   RDW 24.6 (H) 10/17/2020 1609   Iron Studies    Component Value Date/Time   IRON 65 06/18/2023 1302   IRON 48 10/17/2020 1609   TIBC 365 06/18/2023 1302   TIBC 370 10/17/2020 1609   FERRITIN 69 06/18/2023 1303   FERRITIN 327 (H) 10/17/2020 1609   IRONPCTSAT 18 06/18/2023 1302   IRONPCTSAT 6 (L) 01/03/2023 1402   Lipid Panel     Component Value Date/Time   CHOL 229 (H) 08/05/2022  1040   CHOL 219 (H) 10/17/2020 1609   TRIG 189.0 (H) 08/05/2022 1040   HDL 33.90 (L) 08/05/2022 1040   HDL 34 (L) 10/17/2020 1609   CHOLHDL 7 08/05/2022 1040   VLDL 37.8 08/05/2022 1040   LDLCALC 157 (H) 08/05/2022 1040   LDLCALC 149 (H) 10/17/2020 1609   Hepatic Function Panel     Component Value Date/Time   PROT 7.5 06/18/2023 1302   PROT 7.3 07/18/2020 1548   ALBUMIN 4.3 06/18/2023 1302   ALBUMIN 4.4 07/18/2020 1548   AST 13 (L) 06/18/2023 1302   ALT 20 06/18/2023 1302   ALKPHOS 91 06/18/2023 1302   BILITOT 0.2 06/18/2023 1302   BILIDIR <0.1 11/14/2021 1951   IBILI NOT CALCULATED 11/14/2021 1951      Component Value Date/Time   TSH 0.633 06/25/2023 0838   Nutritional Lab Results  Component Value Date   VD25OH 24.0 (L) 06/25/2023   VD25OH 20.35 (L) 01/03/2023   VD25OH 18.16 (L) 08/05/2022     Assessment and Plan    Obesity   She is struggling with weight loss despite adherence to a category two eating plan and regular treadmill exercise, resulting in a one-pound loss over two weeks. She expresses difficulty with dietary changes and seeks additional support through medication. She is interested in [place] but cannot afford it due to high costs. Discussed the role of medications in weight loss, emphasizing their function in reducing hunger and cravings rather than increasing metabolism. Educated on the importance of protein intake and strength training to maintain muscle mass and metabolism. Bupropion was chosen for its cost-effectiveness, ability to reduce cravings, and insurance coverage. Informed her that bupropion facilitates adherence to dietary changes but does not cause weight loss without effort. Discussed potential side effects, including dry mouth and increased productivity, and advised morning administration to avoid insomnia.   - Provide another copy of the category two eating plan.   - Advise journaling with a 1000-1200 calorie goal and 75 or more grams of  protein.   - Educate on nutritional strategies, including protein intake and strength training.   - Prescribe bupropion SR 150 mg in the morning to help reduce cravings.   - Send prescription to [place] at [facility].   - Schedule follow-up appointment in two weeks.    Emotional eating behavior   She reports significant cravings for less ideal foods, with emotional stress and personal life challenges contributing to her eating behavior. Discussed the role of bupropion in reducing cravings and aiding in weight management.   - Prescribe bupropion SR 150 mg in the morning to help reduce cravings.   - Send prescription to [place] at [facility].    Vitamin D deficiency  Recent lab results indicate low vitamin D levels, concerning given her osteoporosis. Low vitamin D can contribute to fatigue and sluggishness. Explained the importance of maintaining adequate vitamin D levels and the lack of side effects from supplementation.   - Prescribe vitamin D 50,000 IU weekly for five weeks with no refills.    Hyperlipidemia   She is on Lipitor 20 mg and following a low cholesterol diet. Plan to recheck lipid levels in three months.   - Continue Lipitor 20 mg daily.   - Maintain low cholesterol diet.   - Recheck lipid levels in three months.         I have personally spent 55 minutes total time today in preparation, patient care, and documentation for this visit, including the following: review of clinical lab tests; review of medical tests/procedures/services.    She was informed of the importance of frequent follow up visits to maximize her success with intensive lifestyle modifications for her multiple health conditions.    Quillian Quince, MD

## 2023-07-18 ENCOUNTER — Telehealth: Payer: Self-pay | Admitting: Pharmacy Technician

## 2023-07-18 ENCOUNTER — Encounter: Payer: Self-pay | Admitting: Hematology

## 2023-07-18 NOTE — Telephone Encounter (Signed)
 Elizabeth Best has been submitted. Patient will need retro-auth DOS 02/25/23. Pending [employer] [id number]  I will f/u once I have a response from insurance.  [name]

## 2023-07-22 ENCOUNTER — Encounter: Payer: Self-pay | Admitting: Hematology

## 2023-07-22 NOTE — Progress Notes (Unsigned)
 SUBJECTIVE: Discussed the use of AI scribe software for clinical note transcription with the patient, who gave verbal consent to proceed.  Chief Complaint: Obesity  Interim History: She is down 1 lb since her last visit.   [name] is here to discuss her progress with her obesity treatment plan. She is on the Category 2 Plan and states she is following her eating plan approximately 70 % of the time. She states she is exercising walking the treadmill for 30 minutes 4 times per week. Elizabeth Best is a 68 year old female with obesity who presents for follow-up of her obesity treatment plan.  She has lost 1 pound since her last visit, totaling a 2-pound loss over two months. She is frustrated with the slow progress despite making significant lifestyle changes, including exercising, avoiding fried foods, and reducing intake of sweets and bread. She feels deprived due to these dietary restrictions.  She is currently taking Lipitor 20 mg daily, bupropion 150 mg daily, and ergocalciferol 50,000 units once weekly. Bupropion helps slightly with cravings, but she still occasionally indulges in sweets, especially when purchasing them for her 51 year old mother.  She discusses the financial challenges of obtaining weight loss medications like Wegovy, which her insurance does not fully cover, leaving her with a significant out-of-pocket expense. She is exploring other options, including potential coverage through a special Medicaid program.  Her family history is significant for diabetes, heart disease, high cholesterol, and high blood pressure. She is aware of her prediabetes status and is cautious about medication use, preferring to avoid unnecessary medications.  She is retired and considering returning to work due to financial struggles.  OBJECTIVE: Visit Diagnoses: Problem List Items Addressed This Visit     Essential hypertension   Vitamin D deficiency - Primary   Depression   Other Visit  Diagnoses       Mixed hyperlipidemia         Obesity Elizabeth Best, a 68 year old female with obesity, has lost 2 pounds over two months, which she finds discouraging despite significant lifestyle changes. She has increased muscle mass by 2 pounds, beneficial for long-term weight maintenance. Slow progress is attributed to age-related metabolic changes. She is considering weight loss medications, including Wegovy and Zepbound, but faces financial constraints. Risks of compounded medications were discussed, and she was advised against them due to safety concerns, including reports of harm from improperly mixed compounds. The potential use of metformin was discussed to address insulin resistance and aid in weight loss. Bariatric surgery was mentioned as an option, but lifestyle changes are still necessary for success. - Continue current lifestyle modifications, including exercise and dietary changes. - Consider Zepbound (tirzepatide) as a weight loss medication if financially feasible. - Discuss with insurance about coverage for Zepbound or other weight loss medications. - Consider metformin for insulin resistance if appropriate. - Schedule follow-up appointment in one month to reassess progress and support options.  Prediabetes/ Insulin resistance Elizabeth Best has prediabetes, a risk factor for developing diabetes. The potential use of metformin was discussed to address insulin resistance, which can contribute to weight gain and difficulty losing weight. She is hesitant to start new medications unless necessary. A1c has improved.  Lab Results  Component Value Date   HGBA1C 5.5 06/25/2023   HGBA1C 5.4 01/03/2023   HGBA1C 5.7 (A) 09/26/2021   Lab Results  Component Value Date   LDLCALC 157 (H) 08/05/2022   CREATININE 0.90 06/18/2023   INSULIN  Date Value Ref Range Status  06/25/2023 17.4 2.6 -  24.9 uIU/mL Final  ] Continue working on nutrition plan to decrease simple carbohydrates, increase  lean proteins and exercise to promote weight loss, improve glycemic control and prevent progression to Type 2 diabetes.  - Provide educational materials on insulin resistance and prediabetes. - Consider metformin if insulin resistance is confirmed and she agrees.  Hyperlipidemia/ Low HDL Elizabeth Best is on Lipitor 20 mg daily for hyperlipidemia. Lab Results  Component Value Date   CHOL 229 (H) 08/05/2022   CHOL 139 01/17/2022   CHOL 150 05/21/2021   Lab Results  Component Value Date   HDL 33.90 (L) 08/05/2022   HDL 36.00 (L) 01/17/2022   HDL 34.50 (L) 05/21/2021   Lab Results  Component Value Date   LDLCALC 157 (H) 08/05/2022   LDLCALC 87 01/17/2022   LDLCALC 93 05/21/2021   Lab Results  Component Value Date   TRIG 189.0 (H) 08/05/2022   TRIG 83.0 01/17/2022   TRIG 113.0 05/21/2021   Lab Results  Component Value Date   CHOLHDL 7 08/05/2022   CHOLHDL 4 01/17/2022   CHOLHDL 4 05/21/2021   No results found for: "LDLDIRECT" Continue to work on nutrition plan -decreasing simple carbohydrates, increasing lean proteins, decreasing saturated fats and cholesterol , avoiding trans fats and exercise as able to promote weight loss, improve lipids and decrease cardiovascular risks. Continue statin therapy.   Vitamin D deficiency Henya is on ergocalciferol 50,000 units once weekly for vitamin D deficiency. She reports having enough medication and does not require a refill at this time. Last vitamin D Lab Results  Component Value Date   VD25OH 24.0 (L) 06/25/2023   Plan:  Low vitamin D levels can be associated with adiposity and may result in leptin resistance and weight gain. Also associated with fatigue.  Currently on vitamin D supplementation without any adverse effects such as nausea, vomiting or muscle weakness.  Continue Ergocalciferol 50, 000 units once weekly.   Follow-up A follow-up appointment is scheduled to reassess Elizabeth Best's progress with her weight loss plan and  to discuss any new developments regarding medication coverage or additional support options. - Schedule follow-up appointment with Doctor Dalbert Garnet on Wednesday, May 7th at 11 AM.  Vitals Temp: 97.9 F (36.6 C) BP: 136/72 Pulse Rate: 76 SpO2: 100 %   Anthropometric Measurements Height: 5\' 3"  (1.6 m) Weight: 187 lb (84.8 kg) BMI (Calculated): 33.13 Weight at Last Visit: 188 lb Weight Lost Since Last Visit: 1 lb Weight Gained Since Last Visit: 0 Starting Weight: 189 lb Total Weight Loss (lbs): 2 lb (0.907 kg) Peak Weight: 199 lb   Body Composition  Body Fat %: 40.5 % Fat Mass (lbs): 76 lbs Muscle Mass (lbs): 106.2 lbs Total Body Water (lbs): 70.2 lbs Visceral Fat Rating : 12   Other Clinical Data Fasting: yes Labs: no Today's Visit #: 3 Starting Date: 06/25/23     ASSESSMENT AND PLAN:  Diet: Ariannie is currently in the action stage of change. As such, her goal is to continue with weight loss efforts. She has agreed to Category 2 Plan.  Exercise: Dilpreet has been instructed to work up to a goal of 150 minutes of combined cardio and strengthening exercise per week for weight loss and overall health benefits.   Behavior Modification:  We discussed the following Behavioral Modification Strategies today: increasing lean protein intake, decreasing simple carbohydrates, increasing vegetables, increase H2O intake, increase high fiber foods, better snacking choices, avoiding temptations, and planning for success. We discussed various medication options to help [facility]  with her weight loss efforts and we both agreed to continue to work on nutritional and behavioral strategies to promote weight loss.  .  Return in about 4 weeks (around 08/20/2023).Marland Kitchen She was informed of the importance of frequent follow up visits to maximize her success with intensive lifestyle modifications for her multiple health conditions.  Attestation Statements:   Reviewed by clinician on day of  visit: allergies, medications, problem list, medical history, surgical history, family history, social history, and previous encounter notes.   Time spent on visit including pre-visit chart review and post-visit care and charting was 26 minutes.    Clements Toro, PA-C

## 2023-07-23 ENCOUNTER — Encounter: Payer: Self-pay | Admitting: Nurse Practitioner

## 2023-07-23 ENCOUNTER — Encounter (INDEPENDENT_AMBULATORY_CARE_PROVIDER_SITE_OTHER): Payer: Self-pay | Admitting: Physician Assistant

## 2023-07-23 ENCOUNTER — Ambulatory Visit (INDEPENDENT_AMBULATORY_CARE_PROVIDER_SITE_OTHER): Admitting: Physician Assistant

## 2023-07-23 VITALS — BP 136/72 | HR 76 | Temp 97.9°F | Ht 63.0 in | Wt 187.0 lb

## 2023-07-23 DIAGNOSIS — I10 Essential (primary) hypertension: Secondary | ICD-10-CM | POA: Diagnosis not present

## 2023-07-23 DIAGNOSIS — E559 Vitamin D deficiency, unspecified: Secondary | ICD-10-CM

## 2023-07-23 DIAGNOSIS — F32A Depression, unspecified: Secondary | ICD-10-CM

## 2023-07-23 DIAGNOSIS — F3289 Other specified depressive episodes: Secondary | ICD-10-CM

## 2023-07-23 DIAGNOSIS — E782 Mixed hyperlipidemia: Secondary | ICD-10-CM | POA: Diagnosis not present

## 2023-07-23 DIAGNOSIS — E669 Obesity, unspecified: Secondary | ICD-10-CM

## 2023-07-23 DIAGNOSIS — Z6833 Body mass index (BMI) 33.0-33.9, adult: Secondary | ICD-10-CM | POA: Diagnosis not present

## 2023-07-24 ENCOUNTER — Encounter: Payer: Self-pay | Admitting: Hematology
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
## 2023-07-25 ENCOUNTER — Other Ambulatory Visit (HOSPITAL_COMMUNITY)
Admission: RE | Admit: 2023-07-25 | Discharge: 2023-07-25 | Disposition: A | Discharge status: Home / Self Care | Source: Ambulatory Visit | Attending: Obstetrics | Admitting: Obstetrics

## 2023-07-25 ENCOUNTER — Ambulatory Visit (INDEPENDENT_AMBULATORY_CARE_PROVIDER_SITE_OTHER): Payer: Medicare PPO | Admitting: Obstetrics

## 2023-07-25 ENCOUNTER — Encounter: Payer: Self-pay | Admitting: Obstetrics

## 2023-07-25 VITALS — BP 127/78 | HR 77 | Ht 62.6 in | Wt 190.0 lb

## 2023-07-25 DIAGNOSIS — R102 Pelvic and perineal pain: Secondary | ICD-10-CM | POA: Diagnosis not present

## 2023-07-25 DIAGNOSIS — F17210 Nicotine dependence, cigarettes, uncomplicated: Secondary | ICD-10-CM

## 2023-07-25 DIAGNOSIS — R351 Nocturia: Secondary | ICD-10-CM | POA: Insufficient documentation

## 2023-07-25 DIAGNOSIS — N952 Postmenopausal atrophic vaginitis: Secondary | ICD-10-CM

## 2023-07-25 DIAGNOSIS — K581 Irritable bowel syndrome with constipation: Secondary | ICD-10-CM

## 2023-07-25 DIAGNOSIS — G629 Polyneuropathy, unspecified: Secondary | ICD-10-CM | POA: Diagnosis not present

## 2023-07-25 DIAGNOSIS — Z6834 Body mass index (BMI) 34.0-34.9, adult: Secondary | ICD-10-CM | POA: Diagnosis not present

## 2023-07-25 DIAGNOSIS — F17209 Nicotine dependence, unspecified, with unspecified nicotine-induced disorders: Secondary | ICD-10-CM

## 2023-07-25 LAB — POCT URINALYSIS DIPSTICK
Bilirubin, UA: NEGATIVE
Blood, UA: NEGATIVE
Glucose, UA: NEGATIVE
Ketones, UA: NEGATIVE
Leukocytes, UA: NEGATIVE
Nitrite, UA: NEGATIVE
Protein, UA: NEGATIVE
Spec Grav, UA: 1.02 (ref 1.010–1.025)
Urobilinogen, UA: 0.2 U/dL
pH, UA: 5.5 (ref 5.0–8.0)

## 2023-07-25 MED ORDER — LIDOCAINE 5 % EX OINT: TOPICAL_OINTMENT | CUTANEOUS | 0 refills | Status: DC

## 2023-07-25 MED ORDER — ESTRADIOL 0.1 MG/GM VA CREA
TOPICAL_CREAM | VAGINAL | 3 refills | Status: DC
Start: 2023-07-28 — End: 2023-10-09

## 2023-07-25 NOTE — Assessment & Plan Note (Signed)
-   insomnia with high caffeine intake, encouraged caffeine reduction - avoid fluid intake 3 hours before bedtime - elevated feet during the day or use compression socks to reduce lower extremity swelling - switch your diuretic (e.g. furosemide) dosing to 2pm - reports snoring with negative sleep study in 2017, consider repeat if no improvement with caffeine reduction to r/o sleep apnea

## 2023-07-25 NOTE — Patient Instructions (Addendum)
 The origin of pelvic floor muscle spasm can be multifactorial, including primary, reactive to a different pain source, trauma, or even part of a centralized pain syndrome.Treatment options include pelvic floor physical therapy, local (vaginal) or oral  muscle relaxants, pelvic muscle trigger point injections or centrally acting pain medications.    ions.  - Rx topical lidocaine 0.5g PRN pain up to 3x/day - discussed conservative management options with cold compress  - lubrication use during intercourse - pending pelvic floor PT appointment Please call [phone] to schedule the earliest appointment for pelvic floor PT.  - discussed proper vulvar care, warm compression, cotton only underwear and barrier ointment if needed   For vaginal atrophy (thinning of the vaginal tissue that can cause dryness and burning) and UTI prevention we discussed estrogen replacement in the form of vaginal cream.   Start vaginal estrogen therapy nightly for two weeks then 2 times weekly at night. This can be placed with your finger or an applicator inside the vagina and around the urethra.  Please let us know if the prescription is too expensive and we can look for alternative options.   Is vaginal estrogen therapy safe for me? Vaginal estrogen preparations act on the vaginal skin, and only a very tiny amount is absorbed into the bloodstream (0.01%).  They work in a similar way to hand or face cream.  There is minimal absorption and they are therefore perfectly safe. If you have had breast cancer and have persistent troublesome symptoms which aren't settling with vaginal moisturisers and lubricants, local estrogen treatment may be a possibility, but consultation with your oncologist should take place first.   For night time frequency: - avoid fluid intake 3 hours before bedtime - elevated your feet during the day or use compression socks to reduce lower extremity swelling - due to snoring, consider workup for  sleep apnea  We discussed the symptoms of overactive bladder (OAB), which include urinary urgency, urinary frequency, night-time urination, with or without urge incontinence.  We discussed management including behavioral therapy (decreasing bladder irritants by following a bladder diet, urge suppression strategies, timed voids, bladder retraining), physical therapy, medication; and for refractory cases posterior tibial nerve stimulation, sacral neuromodulation, and intravesical botulinum toxin injection.

## 2023-07-25 NOTE — Assessment & Plan Note (Addendum)
-   Nuswab to r/o infectious etiology - provided handout regarding vulvodynia, reviewed comfort measures and treatment options.  - Rx topical lidocaine 1g PRN pain up to 3x/day - consider Rx Amitriptyline 2.5%/ gabapentin 2.5%/ baclofen 2.5% in vaginal cream. Will send to compounding pharmacy for use daily if refractory symptoms. - discussed conservative management options with cold compress  - lubrication use during intercourse - referral for pelvic floor PT  - discussed proper vulvar care, warm compression, avoid pad use, cotton only underwear and barrier ointment if needed  - encouraged titration of gabapentin

## 2023-07-25 NOTE — Assessment & Plan Note (Addendum)
-   encouraged tobacco cessation and starting Chantix due to history of COPD - discussed prior attempts to quit - pt reports 6 cigarette use/day

## 2023-07-25 NOTE — Assessment & Plan Note (Signed)
-   surgical menopause at 68yo per pt - reports dryness during intercourse - For symptomatic vaginal atrophy options include lubrication with a water-based lubricant, personal hygiene measures and barrier protection against wetness, and estrogen replacement in the form of vaginal cream, vaginal tablets, or a time-released vaginal ring.   - trial of low dose vaginal estrogen

## 2023-07-25 NOTE — Assessment & Plan Note (Addendum)
-   discussed association of elevated BMI with pelvic floor disorders  - discussed 5-8% weight loss can reduce 45-50% of urinary incontinence  - encouraged to continue exercises 3x/week  - Rx Wegovy cost prohibitive, encouraged pt to review alternatives with PCP

## 2023-07-25 NOTE — Assessment & Plan Note (Addendum)
-   h/o back pain with sciatica, cervical pain that radiates to her spine since 1985 or 1989 with prior hospitalization every few years, last in 2007-2008 treated with narcotics and muscle relaxants.  - participates in NA with history of substance abuse - R sided LE neuropathic pain with R sided vulvar and vaginal pain on exam - Rx 100mg  qam and 200mg  qpm, pt reports PRN use for foot pain only which reduces when she is not working - encouraged to use gabapentin 100mg  nightly and titrate as needed to assess pain relief - Cr 0.9, encouraged NSAID use

## 2023-07-25 NOTE — Assessment & Plan Note (Signed)
>>  ASSESSMENT AND PLAN FOR BMI 34.0-34.9,ADULT WRITTEN ON 07/25/2023 10:08 PM BY YUEN, HOI T, MD  - discussed association of elevated BMI with pelvic floor disorders  - discussed 5-8% weight loss can reduce 45-50% of urinary incontinence  - encouraged to continue exercises 3x/week  - Rx Wegovy  cost prohibitive, encouraged pt to review alternatives with PCP

## 2023-07-28 ENCOUNTER — Encounter: Payer: Self-pay | Admitting: Obstetrics and Gynecology

## 2023-07-28 LAB — CERVICOVAGINAL ANCILLARY ONLY
Bacterial Vaginitis (gardnerella): NEGATIVE
Candida Glabrata: NEGATIVE
Candida Vaginitis: NEGATIVE
Comment: NEGATIVE
Comment: NEGATIVE
Comment: NEGATIVE

## 2023-08-04 ENCOUNTER — Encounter: Payer: Self-pay | Admitting: Hematology

## 2023-08-04 ENCOUNTER — Telehealth: Payer: Self-pay | Admitting: Pharmacy Technician

## 2023-08-04 ENCOUNTER — Encounter: Payer: Self-pay | Admitting: Nurse Practitioner

## 2023-08-04 ENCOUNTER — Other Ambulatory Visit: Payer: Self-pay | Admitting: Physician Assistant

## 2023-08-04 NOTE — Telephone Encounter (Signed)
 Auth Submission: NO AUTH NEEDED Site of care: Site of care: [facility] Payer: [employer] MEDICARE Medication & CPT/J Code(s) submitted: Venofer (Iron Sucrose) J1756 Route of submission (phone, fax, portal):  Phone # Fax # Auth type: Buy/Bill PB Units/visits requested: 3 DOSES Reference number:  Approval from: 08/04/23 to 12/04/23

## 2023-08-13 ENCOUNTER — Encounter: Payer: Self-pay | Admitting: Nurse Practitioner

## 2023-08-13 ENCOUNTER — Ambulatory Visit (INDEPENDENT_AMBULATORY_CARE_PROVIDER_SITE_OTHER): Admitting: Nurse Practitioner

## 2023-08-13 ENCOUNTER — Other Ambulatory Visit: Payer: Self-pay | Admitting: Nurse Practitioner

## 2023-08-13 VITALS — BP 132/82 | HR 68 | Temp 97.0°F | Ht 63.0 in | Wt 193.0 lb

## 2023-08-13 DIAGNOSIS — M8580 Other specified disorders of bone density and structure, unspecified site: Secondary | ICD-10-CM

## 2023-08-13 DIAGNOSIS — Z780 Asymptomatic menopausal state: Secondary | ICD-10-CM

## 2023-08-13 DIAGNOSIS — Z0000 Encounter for general adult medical examination without abnormal findings: Secondary | ICD-10-CM

## 2023-08-13 DIAGNOSIS — Z1231 Encounter for screening mammogram for malignant neoplasm of breast: Secondary | ICD-10-CM
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
## 2023-08-13 NOTE — Patient Instructions (Addendum)
 Start calcium  600mg  BID and Vit. D 2000IU daily Schedule appointment for repeat bone density and mammogram.  Ms. Hattery , Thank you for taking time to come for your Medicare Wellness Visit. I appreciate your ongoing commitment to your health goals. Please review the following plan we discussed and let me know if I can assist you in the future.   These are the goals we discussed:  Goals       Acknowledge receipt of Advanced Directive package     Complete and notarize living will and health care power of attorney form. Bring copy of completed form to office.     Weight (lb) < 200 lb (90.7 kg)     Exercises 2x a week.        This is a list of the screening recommended for you and due dates:  Health Maintenance  Topic Date Due   COVID-19 Vaccine (3 - 2024-25 season) 12/22/2022   DTaP/Tdap/Td vaccine (4 - Td or Tdap) 10/30/2023   Flu Shot  11/21/2023   Mammogram  08/01/2024   Medicare Annual Wellness Visit  08/12/2024   Colon Cancer Screening  08/19/2027   Pneumonia Vaccine  Completed   DEXA scan (bone density measurement)  Completed   Hepatitis C Screening  Completed   HIV Screening  Completed   Zoster (Shingles) Vaccine  Completed   HPV Vaccine  Aged Out   Meningitis B Vaccine  Aged Out

## 2023-08-14 ENCOUNTER — Telehealth: Payer: Self-pay | Admitting: Nurse Practitioner

## 2023-08-14 ENCOUNTER — Encounter: Payer: Self-pay | Admitting: Nurse Practitioner

## 2023-08-14 NOTE — Telephone Encounter (Signed)
-----   Message from Elois Hair sent at 08/13/2023  4:08 PM EDT ----- Regarding: RE: AVM per endoscopy Hello,  The labs I see show that her hgb is well above normal limits and her ferritin has been normal.  I don't know that I would recommend with putting her through the enteroscopy and colonoscopy to treat the AVMs with normal hgb and iron  stores.  Are there more recent labs than February that I can't see?  Thanks for reaching out,  Darol Elizabeth ----- Message ----- From: Kandace Organ, NP Sent: 08/13/2023  12:30 PM EDT To: Elois Hair, MD Subject: AVM per endoscopy                              Good Morning, Ms. Szalkowski continues to have iron  deficient anemia. Per capsule endoscopy on 02/14/2023, an ablation was recommended. Hematology advised her to f/up with you, but she states she was not able to get a call back from your office. Is there a plan to schedule this or wait at this time?  Thank you for your collaboration  Kathrene Parents, NP

## 2023-08-14 NOTE — Telephone Encounter (Signed)
 Called and left a voice message per DPR on file asking patient to give me a call back at the office at [phone]. I will try calling patient again.

## 2023-08-15 NOTE — Telephone Encounter (Signed)
 Copied from CRM [id number]. Topic: General - Other >> Aug 15, 2023  9:32 AM [name] wrote: Reason for CRM: Patient is  returning a call he received from Aggie Alder, I advised the patient of the message left by [facility]. Patient understood and did not have any follow up questions.

## 2023-08-22 ENCOUNTER — Ambulatory Visit

## 2023-08-25 ENCOUNTER — Other Ambulatory Visit: Payer: Self-pay | Admitting: Nurse Practitioner

## 2023-08-25 ENCOUNTER — Other Ambulatory Visit (INDEPENDENT_AMBULATORY_CARE_PROVIDER_SITE_OTHER): Payer: Self-pay | Admitting: Family Medicine

## 2023-08-25 DIAGNOSIS — F17209 Nicotine dependence, unspecified, with unspecified nicotine-induced disorders: Secondary | ICD-10-CM

## 2023-08-25 DIAGNOSIS — F3289 Other specified depressive episodes: Secondary | ICD-10-CM

## 2023-08-27 ENCOUNTER — Encounter (INDEPENDENT_AMBULATORY_CARE_PROVIDER_SITE_OTHER): Payer: Self-pay | Admitting: Physician Assistant

## 2023-08-27 ENCOUNTER — Ambulatory Visit (INDEPENDENT_AMBULATORY_CARE_PROVIDER_SITE_OTHER): Admitting: Physician Assistant

## 2023-08-27 VITALS — BP 137/80 | HR 76 | Temp 98.0°F | Ht 63.0 in | Wt 193.0 lb

## 2023-08-27 DIAGNOSIS — F3289 Other specified depressive episodes: Secondary | ICD-10-CM

## 2023-08-27 DIAGNOSIS — F32A Depression, unspecified: Secondary | ICD-10-CM | POA: Diagnosis not present

## 2023-08-27 DIAGNOSIS — F17209 Nicotine dependence, unspecified, with unspecified nicotine-induced disorders: Secondary | ICD-10-CM

## 2023-08-27 DIAGNOSIS — I700 Atherosclerosis of aorta: Secondary | ICD-10-CM | POA: Diagnosis not present

## 2023-08-27 DIAGNOSIS — F17210 Nicotine dependence, cigarettes, uncomplicated: Secondary | ICD-10-CM | POA: Diagnosis not present

## 2023-08-27 DIAGNOSIS — Z6834 Body mass index (BMI) 34.0-34.9, adult: Secondary | ICD-10-CM

## 2023-08-27 DIAGNOSIS — E782 Mixed hyperlipidemia: Secondary | ICD-10-CM

## 2023-08-27 DIAGNOSIS — I10 Essential (primary) hypertension: Secondary | ICD-10-CM

## 2023-08-27 DIAGNOSIS — E559 Vitamin D deficiency, unspecified: Secondary | ICD-10-CM

## 2023-08-27 DIAGNOSIS — E669 Obesity, unspecified: Secondary | ICD-10-CM | POA: Diagnosis not present

## 2023-08-27 DIAGNOSIS — E88819 Insulin resistance, unspecified: Secondary | ICD-10-CM | POA: Diagnosis not present

## 2023-08-27 MED ORDER — NICOTINE 21 MG/24HR TD PT24
21.0000 mg | MEDICATED_PATCH | Freq: Every day | TRANSDERMAL | 0 refills | Status: DC
Start: 2023-08-27 — End: 2024-02-02

## 2023-08-27 MED ORDER — METFORMIN HCL 500 MG PO TABS: 500.0000 mg | ORAL_TABLET | Freq: Every day | ORAL | 0 refills | Status: DC

## 2023-08-27 MED ORDER — BUPROPION HCL ER (SR) 150 MG PO TB12: 150.0000 mg | ORAL_TABLET | Freq: Every day | ORAL | 0 refills | Status: DC

## 2023-08-27 MED ORDER — VITAMIN D (ERGOCALCIFEROL) 1.25 MG (50000 UNIT) PO CAPS
50000.0000 [IU] | ORAL_CAPSULE | ORAL | 0 refills | Status: DC
Start: 2023-08-27 — End: 2023-10-09

## 2023-08-27 NOTE — Progress Notes (Unsigned)
 SUBJECTIVE: Discussed the use of AI scribe software for clinical note transcription with the patient, who gave verbal consent to proceed.  Chief Complaint: Obesity  Interim History: She is up 6 lbs since last visit.   Elizabeth Best is here to discuss her progress with her obesity treatment plan. She is on the Category 2 Plan and states she is following her eating plan approximately 80 % of the time. She states she is exercising treadmill 30 minutes 3 times per week.  Elizabeth Best is a 68 year old female who presents for follow-up of her obesity treatment plan.  She acknowledges an increase in adipose tissue and describes emotional eating behaviors, particularly late-night cravings for sweets like ginger cookies, which she sometimes consumes in large quantities. Despite her efforts, she has not increased her physical activity recently.  Her medical history includes vitamin D  deficiency, hyperlipidemia, hypertension, and aortic atherosclerosis. She is currently taking Lipitor 20 mg every evening, bupropion  150 mg daily, and vitamin D  50,000 units once weekly. She also takes over-the-counter calcium  supplements.  She has a history of elevated insulin  levels, with a previous insulin  level of 17.4 and past A1c levels in the prediabetic range (5.8 in 2016 and 5.7 in 2023). She experiences late-night hunger and cravings for sweets, which she attributes to her emotional eating behavior.  She is attempting to quit smoking and is currently using nicotine  patches and Chantix . She has been using 14 mg patches but feels they are insufficient and requests to increase to 21 mg patches.  Her social history includes regular gym attendance, where she engages in treadmill walking, peddling, and stretching exercises. However, she has reduced her gym visits to two or three times a week from daily visits. She consumes a significant amount of coffee, typically three cups before noon, and is not a breakfast person,  although she has tried protein shakes in the morning OBJECTIVE: Visit Diagnoses: Problem List Items Addressed This Visit     Essential hypertension   Vitamin D  deficiency - Primary   Relevant Medications   Vitamin D , Ergocalciferol , (DRISDOL ) 1.25 MG (50000 UNIT) CAPS capsule   Tobacco use disorder, continuous   Relevant Medications   nicotine  (NICODERM CQ  - DOSED IN MG/24 HOURS) 21 mg/24hr patch   Depression   Relevant Medications   buPROPion  (WELLBUTRIN  SR) 150 MG 12 hr tablet   Generalized obesity   Relevant Medications   metFORMIN (GLUCOPHAGE) 500 MG tablet   Aortic atherosclerosis (HCC)   BMI 34.0-34.9,adult   Other Visit Diagnoses       Mixed hyperlipidemia         Insulin  resistance       Relevant Medications   metFORMIN (GLUCOPHAGE) 500 MG tablet     Obesity Weight gain primarily in adipose tissue. Discussed potential use of metformin to address insulin  resistance and curb cravings, particularly for sweets. Emphasized the importance of lifestyle changes, including increased physical activity and dietary modifications, to manage weight. Advised against bariatric surgery due to current health risks, including smoking and the need for lifestyle changes even with bariatric surgery. Discussed the potential for Wegovy  if insurance coverage is obtained. - Initiate metformin at half a tablet with evening meal for one week, then increase to one tablet if tolerated - Encourage increased physical activity and dietary modifications  Emotional eating behavior Reports nighttime cravings and consumption of sweets, such as ginger cookies. Discussed the role of metformin and bupropion  in managing cravings and emotional eating. Emphasized the importance of addressing emotional  eating as part of the weight management plan. - Continue bupropion  150 mg daily - Initiate metformin as outlined under insulin  resistance and obesity - Encourage use of protein shakes in the morning to reduce  cravings  Insulin  resistance Elevated insulin  levels indicating insulin  resistance. A1c has been in the prediabetic range in the past. Discussed the role of metformin in managing insulin  resistance and its potential to reduce fat storage and curb cravings. Emphasized the importance of addressing insulin  resistance to prevent progression to type 2 diabetes. - Initiate metformin as outlined under Obesity - Provide educational material on insulin  resistance and metformin Meds ordered this encounter  Medications   Vitamin D , Ergocalciferol , (DRISDOL ) 1.25 MG (50000 UNIT) CAPS capsule    Sig: Take 1 capsule (50,000 Units total) by mouth every 7 (seven) days.    Dispense:  5 capsule    Refill:  0   buPROPion  (WELLBUTRIN  SR) 150 MG 12 hr tablet    Sig: Take 1 tablet (150 mg total) by mouth daily.    Dispense:  30 tablet    Refill:  0   metFORMIN (GLUCOPHAGE) 500 MG tablet    Sig: Take 1 tablet (500 mg total) by mouth daily. 1/2 tablet with evening meal for 1 week and if tolerating well, increase to 1 tablet with evening meal.    Dispense:  30 tablet    Refill:  0   nicotine  (NICODERM CQ  - DOSED IN MG/24 HOURS) 21 mg/24hr patch    Sig: Place 1 patch (21 mg total) onto the skin daily.    Dispense:  28 patch    Refill:  0    Hypertension Blood pressure not optimal. BP Readings from Last 3 Encounters:  08/27/23 137/80  08/13/23 132/82  07/25/23 127/78   Continue to work on nutrition plan to promote weight loss and improve BP control.   Discussed the importance of weight management and lifestyle changes in controlling blood pressure.  Vitamin D  deficiency Low vitamin D  levels, currently on high-dose supplementation. Discussed the benefits of maintaining adequate vitamin D  levels for overall health, including bone health and reduced risk of certain diseases. - Continue vitamin D  50,000 units once weekly - Refill vitamin D  prescription  Tobacco use Discussed the importance of smoking  cessation and the use of nicotine  patches and Chantix . Discussed the risks associated with smoking, particularly in the context of considering bariatric surgery. - Prescribe nicotine  patches 21 mg - Advise to contact primary care for Chantix  refill and lower dose nicotine  patches  Follow-up Discussed follow-up plans and the importance of monitoring progress with the new treatment regimen. - Schedule follow-up appointment based on availability  Vitals Temp: 98 F (36.7 C) BP: 137/80 Pulse Rate: 76 SpO2: 99 %   Anthropometric Measurements Height: 5\' 3"  (1.6 m) Weight: 193 lb (87.5 kg) BMI (Calculated): 34.2 Weight at Last Visit: 187 lb Weight Lost Since Last Visit: 0 Weight Gained Since Last Visit: 6 lb Starting Weight: 189 lb Total Weight Loss (lbs): 0 lb (0 kg) Peak Weight: 199 lb   Body Composition  Body Fat %: 42.1 % Fat Mass (lbs): 81.2 lbs Muscle Mass (lbs): 106.2 lbs Total Body Water (lbs): 71.4 lbs Visceral Fat Rating : 12   Other Clinical Data Fasting: no Labs: no Today's Visit #: 4 Starting Date: 06/25/23     ASSESSMENT AND PLAN:  Diet: Aviannah is currently in the action stage of change. As such, her goal is to continue with weight loss efforts. She has  agreed to Category 2 Plan.  Exercise: Elizabeth Best has been instructed to work up to a goal of 150 minutes of combined cardio and strengthening exercise per week for weight loss and overall health benefits.   Behavior Modification:  We discussed the following Behavioral Modification Strategies today: increasing lean protein intake, decreasing simple carbohydrates, increasing vegetables, increase H2O intake, decrease liquid calories, increase high fiber foods, no skipping meals, meal planning and cooking strategies, ways to avoid nighttime snacking, avoiding temptations, and planning for success. We discussed various medication options to help Elizabeth Best with her weight loss efforts and we both agreed to  start metformin for insulin  resistance and continue bupropion  for cravings, continue to work on nutritional and behavioral strategies to promote weight loss.  .  Return in about 4 weeks (around 09/24/2023).Aaron Aas She was informed of the importance of frequent follow up visits to maximize her success with intensive lifestyle modifications for her multiple health conditions.  Attestation Statements:   Reviewed by clinician on day of visit: allergies, medications, problem list, medical history, surgical history, family history, social history, and previous encounter notes.   Time spent on visit including pre-visit chart review and post-visit care and charting was 33 minutes.    Joseguadalupe Stan, PA-C

## 2023-09-01 ENCOUNTER — Ambulatory Visit (HOSPITAL_BASED_OUTPATIENT_CLINIC_OR_DEPARTMENT_OTHER): Admitting: Radiology

## 2023-09-01 ENCOUNTER — Other Ambulatory Visit (HOSPITAL_BASED_OUTPATIENT_CLINIC_OR_DEPARTMENT_OTHER)

## 2023-09-02 ENCOUNTER — Other Ambulatory Visit (INDEPENDENT_AMBULATORY_CARE_PROVIDER_SITE_OTHER): Payer: Self-pay | Admitting: Physician Assistant

## 2023-09-02 ENCOUNTER — Other Ambulatory Visit: Payer: Self-pay | Admitting: Nurse Practitioner

## 2023-09-02 DIAGNOSIS — F17209 Nicotine dependence, unspecified, with unspecified nicotine-induced disorders: Secondary | ICD-10-CM

## 2023-09-10 ENCOUNTER — Ambulatory Visit (INDEPENDENT_AMBULATORY_CARE_PROVIDER_SITE_OTHER): Admitting: Nurse Practitioner

## 2023-09-10 ENCOUNTER — Other Ambulatory Visit (INDEPENDENT_AMBULATORY_CARE_PROVIDER_SITE_OTHER): Payer: Self-pay | Admitting: Physician Assistant

## 2023-09-10 ENCOUNTER — Encounter: Payer: Self-pay | Admitting: Nurse Practitioner

## 2023-09-10 ENCOUNTER — Encounter (INDEPENDENT_AMBULATORY_CARE_PROVIDER_SITE_OTHER): Payer: Self-pay | Admitting: Physician Assistant

## 2023-09-10 VITALS — BP 134/78 | HR 68 | Temp 97.0°F | Ht 63.0 in | Wt 192.4 lb

## 2023-09-10 DIAGNOSIS — M79671 Pain in right foot: Secondary | ICD-10-CM

## 2023-09-10 DIAGNOSIS — E88819 Insulin resistance, unspecified: Secondary | ICD-10-CM

## 2023-09-10 DIAGNOSIS — E782 Mixed hyperlipidemia: Secondary | ICD-10-CM

## 2023-09-10 DIAGNOSIS — G629 Polyneuropathy, unspecified: Secondary | ICD-10-CM

## 2023-09-10 DIAGNOSIS — M79672 Pain in left foot: Secondary | ICD-10-CM

## 2023-09-10 DIAGNOSIS — F17209 Nicotine dependence, unspecified, with unspecified nicotine-induced disorders: Secondary | ICD-10-CM

## 2023-09-10 DIAGNOSIS — E669 Obesity, unspecified: Secondary | ICD-10-CM

## 2023-09-10 DIAGNOSIS — I700 Atherosclerosis of aorta: Secondary | ICD-10-CM

## 2023-09-10 MED ORDER — WEGOVY 0.5 MG/0.5ML ~~LOC~~ SOAJ: 0.5000 mg | SUBCUTANEOUS | 0 refills | Status: DC

## 2023-09-10 MED ORDER — GABAPENTIN 100 MG PO CAPS: 100.0000 mg | ORAL_CAPSULE | Freq: Every day | ORAL | 3 refills | Status: DC

## 2023-09-10 NOTE — Progress Notes (Signed)
 Patient's PCP started Wegovy  on 5/9 and PCP wants [facility] to assume prescribing.  She does not have follow up until 6/17 and will run out of Wegovy  prior to the visit, so we will prescribe Wegovy  0.5 mg weekly and discuss at the visit 6/17.  Karrin Eisenmenger,PA-C

## 2023-09-10 NOTE — Assessment & Plan Note (Addendum)
 Improved with use of gabapentin  100mg  daily prn

## 2023-09-10 NOTE — Progress Notes (Signed)
 Established Patient Visit  Patient: Elizabeth Best   DOB: [birth year]   68 y.o. Female  MRN: [id number] Visit Date: 09/10/2023  Subjective:     Chief Complaint  Patient presents with   Follow-up    4 week f/u for neuropathy    HPI Tobacco use disorder, continuous Currently smokes 1/4ppd Trigger: drinking coffee or company with other smokes, or stress.  We discussed use of nicotine  patch vs chantix  vs zyban . She opted to start nicoderm 21mg  She does not have a quit date at this time Advised to call office for nicoderm 14mg  once she starts 21mg  F/up prn  Neuropathy Improved with use of gabapentin  100mg  daily prn   Reviewed medical, surgical, and social history today  Medications: Outpatient Medications Prior to Visit  Medication Sig   albuterol  (VENTOLIN  HFA) 108 (90 Base) MCG/ACT inhaler Inhale 2 puffs into the lungs every 6 (six) hours as needed for wheezing or shortness of breath.   atorvastatin  (LIPITOR) 20 MG tablet Take 1 tablet (20 mg total) by mouth every evening.   buPROPion  (WELLBUTRIN  SR) 150 MG 12 hr tablet Take 1 tablet (150 mg total) by mouth daily.   estradiol  (ESTRACE ) 0.1 MG/GM vaginal cream Place 0.5g nightly for two weeks then twice a week after   lidocaine  (XYLOCAINE ) 5 % ointment Use 0.5g peasize at the vaginal opening and in the vagina up to 3 times a day   metFORMIN  (GLUCOPHAGE ) 500 MG tablet Take 1 tablet (500 mg total) by mouth daily. 1/2 tablet with evening meal for 1 week and if tolerating well, increase to 1 tablet with evening meal.   nicotine  (NICODERM CQ  - DOSED IN MG/24 HOURS) 21 mg/24hr patch Place 1 patch (21 mg total) onto the skin daily.   Vitamin D , Ergocalciferol , (DRISDOL ) 1.25 MG (50000 UNIT) CAPS capsule Take 1 capsule (50,000 Units total) by mouth every 7 (seven) days.   [DISCONTINUED] gabapentin  (NEURONTIN ) 100 MG capsule 1cap in Am and 2caps in PM   [DISCONTINUED] varenicline  (CHANTIX ) 0.5 MG tablet Take 0.5 mg by mouth  2 (two) times daily.   [DISCONTINUED] WEGOVY  0.25 MG/0.5ML SOAJ Inject 0.25 mg into the skin once a week.   No facility-administered medications prior to visit.   Reviewed past medical and social history.   ROS per HPI above      Objective:  BP 134/78 (BP Location: Left Arm, Patient Position: Sitting, Cuff Size: Large)   Pulse 68   Temp (!) 97 F (36.1 C) (Temporal)   Ht 5\' 3"  (1.6 m)   Wt 192 lb 6.4 oz (87.3 kg)   SpO2 96%   BMI 34.08 kg/m      Physical Exam Vitals and nursing note reviewed.  Cardiovascular:     Rate and Rhythm: Normal rate.     Pulses: Normal pulses.  Pulmonary:     Effort: Pulmonary effort is normal.  Neurological:     Mental Status: She is alert and oriented to person, place, and time.     No results found for any visits on 09/10/23.    Assessment & Plan:    Problem List Items Addressed This Visit     Neuropathy   Improved with use of gabapentin  100mg  daily prn      Tobacco use disorder, continuous - Primary   Currently smokes 1/4ppd Trigger: drinking coffee or company with other smokes, or stress.  We discussed use of nicotine  patch  vs chantix  vs zyban . She opted to start nicoderm 21mg  She does not have a quit date at this time Advised to call office for nicoderm 14mg  once she starts 21mg  F/up prn      Other Visit Diagnoses       Foot pain, bilateral       Relevant Medications   gabapentin  (NEURONTIN ) 100 MG capsule      Return in about 6 months (around 03/12/2024) for HTN, hyperlipidemia (fasting).     Kathrene Parents, NP

## 2023-09-10 NOTE — Patient Instructions (Addendum)
 Start 5000IU daily OVER THE COUNTER dose after completion of high dose. Call office for nicoderm 14mg  patch after completion of 21mg  patch. Schedule a f/up appointment if you start nidoderm  Smoking Tobacco Information, Adult Smoking tobacco can be harmful to your health. Tobacco contains a toxic colorless chemical called nicotine . Nicotine  causes changes in your brain that make you want more and more. This is called addiction. This can make it hard to stop smoking once you start. Tobacco also has other toxic chemicals that can hurt your body and raise your risk of many cancers. Menthol or "lite" tobacco or cigarette brands are not safer than regular brands. How can smoking tobacco affect me? Smoking tobacco puts you at risk for: Cancer. Smoking is most commonly associated with lung cancer, but can also lead to cancer in other parts of the body. Chronic obstructive pulmonary disease (COPD). This is a long-term lung condition that makes it hard to breathe. It also gets worse over time. High blood pressure (hypertension), heart disease, stroke, heart attack, and lung infections, such as pneumonia. Cataracts. This is when the lenses in the eyes become clouded. Digestive problems. This may include peptic ulcers, heartburn, and gastroesophageal reflux disease (GERD). Oral health problems, such as gum disease, mouth sores, and tooth loss. Loss of taste and smell. Smoking also affects how you look and smell. Smoking may cause: Wrinkles. Yellow or stained teeth, fingers, and fingernails. Bad breath. Bad-smelling clothes and hair. Smoking tobacco can also affect your social life, because: It may be challenging to find places to smoke when away from home. Many workplaces, [employer], hotels, and public places are tobacco-free. Smoking is expensive. This is due to the cost of tobacco and the long-term costs of treating health problems from smoking. Secondhand smoke may affect those around you.  Secondhand smoke can cause lung cancer, breathing problems, and heart disease. Children of smokers have a higher risk for: Sudden infant death syndrome (SIDS). Ear infections. Lung infections. What actions can I take to prevent health problems? Quit smoking  Do not start smoking. Quit if you already smoke. Do not replace cigarette smoking with vaping devices, such as e-cigarettes. Make a plan to quit smoking and commit to it. Look for programs to help you, and ask your health care provider for recommendations and ideas. Set a date and write down all the reasons you want to quit. Let your friends and family know you are quitting so they can help and support you. Consider finding friends who also want to quit. It can be easier to quit with someone else, so that you can support each other. Talk with your health care provider about using nicotine  replacement medicines to help you quit. These include gum, lozenges, patches, sprays, or pills. If you try to quit but return to smoking, stay positive. It is common to slip up when you first quit, so take it one day at a time. Be prepared for cravings. When you feel the urge to smoke, chew gum or suck on hard candy. Lifestyle Stay busy. Take care of your body. Get plenty of exercise, eat a healthy diet, and drink plenty of water. Find ways to manage your stress, such as meditation, yoga, exercise, or time spent with friends and family. Ask your health care provider about having regular tests (screenings) to check for cancer. This may include blood tests, imaging tests, and other tests. Where to find support To get support to quit smoking, consider: Asking your health care provider for more  information and resources. Joining a support group for people who want to quit smoking in your local community. There are many effective programs that may help you to quit. Calling the smokefree.gov counselor helpline at 1-800-QUIT-NOW (717)161-1405). Where to find  more information You may find more information about quitting smoking from: Centers for Disease Control and Prevention: http://www.osborne.com/ [employer]: smokefree.gov American Lung Association: freedomfromsmoking.org Contact a health care provider if: You have problems breathing. Your lips, nose, or fingers turn blue. You have chest pain. You are coughing up blood. You feel like you will faint. You have other health changes that cause you to worry. Summary Smoking tobacco can negatively affect your health, the health of those around you, your finances, and your social life. Do not start smoking. Quit if you already smoke. If you need help quitting, ask your health care provider. Consider joining a support group for people in your local community who want to quit smoking. There are many effective programs that may help you to quit. This information is not intended to replace advice given to you by your health care provider. Make sure you discuss any questions you have with your health care provider. Document Revised: 04/03/2021 Document Reviewed: 04/03/2021 Elsevier Patient Education  2024 [employer].

## 2023-09-10 NOTE — Assessment & Plan Note (Addendum)
 Currently smokes 1/4ppd Trigger: drinking coffee or company with other smokes, or stress.  We discussed use of nicotine  patch vs chantix  vs zyban . She opted to start nicoderm 21mg  She does not have a quit date at this time Advised to call office for nicoderm 14mg  once she starts 21mg  F/up prn

## 2023-09-16 ENCOUNTER — Other Ambulatory Visit: Payer: Self-pay

## 2023-09-16 DIAGNOSIS — D509 Iron deficiency anemia, unspecified: Secondary | ICD-10-CM
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
## 2023-09-17 ENCOUNTER — Encounter (HOSPITAL_COMMUNITY): Payer: Self-pay | Admitting: Emergency Medicine

## 2023-09-17 ENCOUNTER — Ambulatory Visit (HOSPITAL_COMMUNITY)
Admission: EM | Admit: 2023-09-17 | Discharge: 2023-09-17 | Disposition: A | Discharge status: Home / Self Care | Attending: Internal Medicine | Admitting: Internal Medicine

## 2023-09-17 ENCOUNTER — Other Ambulatory Visit

## 2023-09-17 ENCOUNTER — Inpatient Hospital Stay: Attending: Hematology | Admitting: Hematology

## 2023-09-17 ENCOUNTER — Other Ambulatory Visit: Payer: Self-pay

## 2023-09-17 ENCOUNTER — Inpatient Hospital Stay

## 2023-09-17 VITALS — BP 147/66 | HR 74 | Temp 97.3°F | Resp 18 | Wt 193.6 lb

## 2023-09-17 DIAGNOSIS — D509 Iron deficiency anemia, unspecified: Secondary | ICD-10-CM | POA: Insufficient documentation

## 2023-09-17 DIAGNOSIS — K2950 Unspecified chronic gastritis without bleeding: Secondary | ICD-10-CM | POA: Insufficient documentation

## 2023-09-17 DIAGNOSIS — B9681 Helicobacter pylori [H. pylori] as the cause of diseases classified elsewhere: Secondary | ICD-10-CM | POA: Insufficient documentation

## 2023-09-17 DIAGNOSIS — F17210 Nicotine dependence, cigarettes, uncomplicated: Secondary | ICD-10-CM | POA: Diagnosis not present

## 2023-09-17 DIAGNOSIS — S39012A Strain of muscle, fascia and tendon of lower back, initial encounter: Secondary | ICD-10-CM

## 2023-09-17 DIAGNOSIS — Z8481 Family history of carrier of genetic disease: Secondary | ICD-10-CM | POA: Diagnosis not present

## 2023-09-17 LAB — CBC WITH DIFFERENTIAL (CANCER CENTER ONLY)
Abs Immature Granulocytes: 0.02 10*3/uL (ref 0.00–0.07)
Basophils Absolute: 0 10*3/uL (ref 0.0–0.1)
Basophils Relative: 0 %
Eosinophils Absolute: 0.1 10*3/uL (ref 0.0–0.5)
Eosinophils Relative: 1 %
HCT: 41.9 % (ref 36.0–46.0)
Hemoglobin: 13.6 g/dL (ref 12.0–15.0)
Immature Granulocytes: 0 %
Lymphocytes Relative: 21 %
Lymphs Abs: 2.1 10*3/uL (ref 0.7–4.0)
MCH: 24.8 pg — ABNORMAL LOW (ref 26.0–34.0)
MCHC: 32.5 g/dL (ref 30.0–36.0)
MCV: 76.5 fL — ABNORMAL LOW (ref 80.0–100.0)
Monocytes Absolute: 0.5 10*3/uL (ref 0.1–1.0)
Monocytes Relative: 5 %
Neutro Abs: 7 10*3/uL (ref 1.7–7.7)
Neutrophils Relative %: 73 %
Platelet Count: 350 10*3/uL (ref 150–400)
RBC: 5.48 MIL/uL — ABNORMAL HIGH (ref 3.87–5.11)
RDW: 15.4 % (ref 11.5–15.5)
WBC Count: 9.7 10*3/uL (ref 4.0–10.5)
nRBC: 0 % (ref 0.0–0.2)

## 2023-09-17 LAB — CMP (CANCER CENTER ONLY)
ALT: 15 U/L (ref 0–44)
AST: 11 U/L — ABNORMAL LOW (ref 15–41)
Albumin: 4.4 g/dL (ref 3.5–5.0)
Alkaline Phosphatase: 81 U/L (ref 38–126)
Anion gap: 7 (ref 5–15)
BUN: 16 mg/dL (ref 8–23)
CO2: 26 mmol/L (ref 22–32)
Calcium: 10.1 mg/dL (ref 8.9–10.3)
Chloride: 106 mmol/L (ref 98–111)
Creatinine: 0.63 mg/dL (ref 0.44–1.00)
GFR, Estimated: >60 mL/min (ref >60)
Glucose, Bld: 82 mg/dL (ref 70–99)
Potassium: 4.1 mmol/L (ref 3.5–5.1)
Sodium: 139 mmol/L (ref 135–145)
Total Bilirubin: 0.3 mg/dL (ref 0.0–1.2)
Total Protein: 7.8 g/dL (ref 6.5–8.1)

## 2023-09-17 LAB — IRON AND IRON BINDING CAPACITY (CC-WL,HP ONLY)
Iron: 37 ug/dL (ref 28–170)
Saturation Ratios: 10 % — ABNORMAL LOW (ref 10.4–31.8)
TIBC: 367 ug/dL (ref 250–450)
UIBC: 330 ug/dL (ref 148–442)

## 2023-09-17 LAB — VITAMIN B12: Vitamin B-12: 370 pg/mL (ref 180–914)

## 2023-09-17 LAB — FERRITIN: Ferritin: 61 ng/mL (ref 11–307)

## 2023-09-17 MED ORDER — METHOCARBAMOL 500 MG PO TABS
500.0000 mg | ORAL_TABLET | Freq: Three times a day (TID) | ORAL | 0 refills | Status: DC | PRN
Start: 2023-09-17 — End: 2024-03-12

## 2023-09-17 MED ORDER — IBUPROFEN 800 MG PO TABS
800.0000 mg | ORAL_TABLET | Freq: Three times a day (TID) | ORAL | 0 refills | Status: AC | PRN
Start: 2023-09-17 — End: ?

## 2023-09-17 MED ORDER — KETOROLAC TROMETHAMINE 30 MG/ML IJ SOLN
15.0000 mg | Freq: Once | INTRAMUSCULAR | Status: AC
  Administered 2023-09-17: 15 mg via INTRAMUSCULAR

## 2023-09-17 MED ORDER — KETOROLAC TROMETHAMINE 30 MG/ML IJ SOLN
INTRAMUSCULAR | Status: AC
Start: 2023-09-17 — End: ?
  Filled 2023-09-17: qty 1

## 2023-09-17 NOTE — ED Triage Notes (Addendum)
 Back pain for 2 days.  Nonspecific known cause of pain.  Patient does spend time at gym.  Patient has taken tylenol , heating pad.  Pain is left back/side.  Hurts getting up, sitting down, twisting torso, lifting arm
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
## 2023-09-17 NOTE — Discharge Instructions (Addendum)
 History, symptoms and physical exam findings are most consistent with a left lumbar muscle strain.  Reassuringly there are no other concerning symptoms or physical exam findings.  We will treat with the following: Toradol  injection given today. This is a medication to help with pain. This is not a narcotic.   Robaxin  500 mg 3 times daily as needed for muscle spasms.  Use caution as this medication can cause drowsiness. Ibuprofen  800 mg every 8 hours as needed for moderate to severe pain. Light stretching to help with stiffness May continue to use heating pad on the area.  Return to urgent care or PCP if symptoms worsen or fail to resolve.

## 2023-09-19 ENCOUNTER — Ambulatory Visit (HOSPITAL_COMMUNITY)
Admission: EM | Admit: 2023-09-19 | Discharge: 2023-09-19 | Disposition: A | Discharge status: Home / Self Care | Attending: Emergency Medicine | Admitting: Emergency Medicine

## 2023-09-19 ENCOUNTER — Ambulatory Visit (INDEPENDENT_AMBULATORY_CARE_PROVIDER_SITE_OTHER)

## 2023-09-19 ENCOUNTER — Encounter (HOSPITAL_COMMUNITY): Payer: Self-pay

## 2023-09-19 DIAGNOSIS — R1012 Left upper quadrant pain: Secondary | ICD-10-CM | POA: Insufficient documentation

## 2023-09-19 DIAGNOSIS — R141 Gas pain: Secondary | ICD-10-CM | POA: Diagnosis not present

## 2023-09-19 DIAGNOSIS — R109 Unspecified abdominal pain: Secondary | ICD-10-CM | POA: Diagnosis not present

## 2023-09-19 DIAGNOSIS — K5900 Constipation, unspecified: Secondary | ICD-10-CM

## 2023-09-19 DIAGNOSIS — R8290 Unspecified abnormal findings in urine: Secondary | ICD-10-CM

## 2023-09-19 LAB — POCT URINALYSIS DIP (MANUAL ENTRY)
Bilirubin, UA: NEGATIVE
Blood, UA: NEGATIVE
Glucose, UA: NEGATIVE mg/dL
Ketones, POC UA: NEGATIVE mg/dL
Leukocytes, UA: NEGATIVE
Nitrite, UA: NEGATIVE
Protein Ur, POC: NEGATIVE mg/dL
Spec Grav, UA: 1.025 (ref 1.010–1.025)
Urobilinogen, UA: 0.2 U/dL
pH, UA: 5.5 (ref 5.0–8.0)
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
## 2023-09-19 NOTE — Discharge Instructions (Addendum)
 Your urinalysis today revealed cloudy appearance but was otherwise normal.  To be complete, we will send your urine for culture to completely rule out possibility of bacterial infection in your left kidney.  Based on the history that you provided to me today as well as the imaging that we performed today, I believe that you are experiencing constipation which is causing you to have bloating and belly gas pain.  I recommend that you clear your bowels using one of the following methods.    Method one:  Please purchase 32 ounces of your favorite flavor of Gatorade and a 250 g bottle of MiraLAX, also known as polyethylene glycol.  It is not important to buy the brand name, the generic version will do.  Pour the MiraLAX powder into the Gatorade and mix well.  When the MiraLAX powder is completely dissolved, please consume the entire 32 ounces within 1 hour.  Within the next 4 hours 4 hours, perhaps a little more or a little less, you should feel a significant urge to move your bowels and be able to produce a significant amount of stool.  If you feel that you have not had significant production of stool, please repeat this process and wait another 4 hours.   -or-  Method two:  Please purchase 1 bottle of magnesium citrate at your pharmacy.  Drink one half of the bottle.  Within the next 4 hours, perhaps a little more or little less, you should feel significant urge to move your bowels and be able to produce a significant amount of stool.  If you feel that you have not had significant production of stool, please drink the second half of the bottle and wait another 4 hours.      Regardless of the bowel cleanout method you chose, if you are unable to produce a significant amount of stool, if clearing your bowels does not completely resolve your abdominal pain or if your abdominal pain becomes significantly worse, please go to the emergency room for further evaluation.  3D imaging, such as a CT scan or an  ultrasound, may be indicated.  Thank you for visiting [facility] today.  We appreciate the opportunity to participate in your care.

## 2023-09-19 NOTE — ED Triage Notes (Signed)
 Pt states seen and tx'd here 2 days ago for back pain. States now pain radiating to lt side. States meds given aren't helping. States laying on a heating pad is the only thing that helps.denies injury.

## 2023-09-20 ENCOUNTER — Emergency Department (HOSPITAL_BASED_OUTPATIENT_CLINIC_OR_DEPARTMENT_OTHER)
Admission: EM | Admit: 2023-09-20 | Discharge: 2023-09-20 | Disposition: A | Discharge status: Home / Self Care | Attending: Emergency Medicine | Admitting: Emergency Medicine

## 2023-09-20 ENCOUNTER — Other Ambulatory Visit: Payer: Self-pay

## 2023-09-20 ENCOUNTER — Encounter (HOSPITAL_BASED_OUTPATIENT_CLINIC_OR_DEPARTMENT_OTHER): Payer: Self-pay | Admitting: Emergency Medicine

## 2023-09-20 ENCOUNTER — Emergency Department (HOSPITAL_BASED_OUTPATIENT_CLINIC_OR_DEPARTMENT_OTHER): Admitting: Radiology

## 2023-09-20 DIAGNOSIS — Z79899 Other long term (current) drug therapy: Secondary | ICD-10-CM | POA: Diagnosis not present

## 2023-09-20 DIAGNOSIS — S39012A Strain of muscle, fascia and tendon of lower back, initial encounter: Secondary | ICD-10-CM | POA: Diagnosis not present

## 2023-09-20 DIAGNOSIS — N644 Mastodynia: Secondary | ICD-10-CM | POA: Diagnosis not present

## 2023-09-20 DIAGNOSIS — M549 Dorsalgia, unspecified: Secondary | ICD-10-CM | POA: Diagnosis not present

## 2023-09-20 DIAGNOSIS — R0781 Pleurodynia: Secondary | ICD-10-CM | POA: Insufficient documentation

## 2023-09-20 DIAGNOSIS — J449 Chronic obstructive pulmonary disease, unspecified: Secondary | ICD-10-CM | POA: Diagnosis not present

## 2023-09-20 DIAGNOSIS — R079 Chest pain, unspecified: Secondary | ICD-10-CM

## 2023-09-20 DIAGNOSIS — I10 Essential (primary) hypertension: Secondary | ICD-10-CM | POA: Diagnosis not present

## 2023-09-20 DIAGNOSIS — D72829 Elevated white blood cell count, unspecified: Secondary | ICD-10-CM | POA: Diagnosis not present

## 2023-09-20 DIAGNOSIS — R0789 Other chest pain: Secondary | ICD-10-CM | POA: Diagnosis not present

## 2023-09-20 DIAGNOSIS — R109 Unspecified abdominal pain: Secondary | ICD-10-CM | POA: Diagnosis not present

## 2023-09-20 LAB — BASIC METABOLIC PANEL WITH GFR
Anion gap: 12 (ref 5–15)
BUN: 12 mg/dL (ref 8–23)
CO2: 23 mmol/L (ref 22–32)
Calcium: 9.9 mg/dL (ref 8.9–10.3)
Chloride: 106 mmol/L (ref 98–111)
Creatinine, Ser: 0.79 mg/dL (ref 0.44–1.00)
GFR, Estimated: >60 mL/min (ref >60)
Glucose, Bld: 71 mg/dL (ref 70–99)
Potassium: 3.9 mmol/L (ref 3.5–5.1)
Sodium: 142 mmol/L (ref 135–145)

## 2023-09-20 LAB — CBC
HCT: 43.7 % (ref 36.0–46.0)
Hemoglobin: 13.8 g/dL (ref 12.0–15.0)
MCH: 24.8 pg — ABNORMAL LOW (ref 26.0–34.0)
MCHC: 31.6 g/dL (ref 30.0–36.0)
MCV: 78.5 fL — ABNORMAL LOW (ref 80.0–100.0)
Platelets: 392 10*3/uL (ref 150–400)
RBC: 5.57 MIL/uL — ABNORMAL HIGH (ref 3.87–5.11)
RDW: 15.9 % — ABNORMAL HIGH (ref 11.5–15.5)
WBC: 11.8 10*3/uL — ABNORMAL HIGH (ref 4.0–10.5)
nRBC: 0 % (ref 0.0–0.2)

## 2023-09-20 LAB — URINE CULTURE: Culture: <10000 — AB

## 2023-09-20 LAB — TROPONIN T, HIGH SENSITIVITY: Troponin T High Sensitivity: <15 ng/L (ref <19)

## 2023-09-20 LAB — D-DIMER, QUANTITATIVE: D-Dimer, Quant: 0.29 ug{FEU}/mL (ref 0.00–0.50)

## 2023-09-20 MED ORDER — FENTANYL CITRATE PF 50 MCG/ML IJ SOSY
50.0000 ug | PREFILLED_SYRINGE | Freq: Once | INTRAMUSCULAR | Status: AC
  Administered 2023-09-20: 50 ug via INTRAVENOUS
  Filled 2023-09-20: qty 1

## 2023-09-20 MED ORDER — HYDROCODONE-ACETAMINOPHEN 5-325 MG PO TABS: 1.0000 | ORAL_TABLET | Freq: Four times a day (QID) | ORAL | 0 refills | Status: DC | PRN

## 2023-09-20 NOTE — ED Provider Notes (Signed)
  EMERGENCY DEPARTMENT AT [facility] Provider Note   CSN: [id number] Arrival date & time: 09/20/23  1446     History {Add pertinent medical, surgical, social history, OB history to HPI:1} No chief complaint on file.   Elizabeth Best is a 68 y.o. female.  She has a history of COPD hypertension hyperlipidemia.  She has been doing exercises at the gym.  4 days ago she began experiencing some pain in her left flank.  Went to urgent care and they told her it is probably muscular and put her on ibuprofen  and a muscle relaxant.  Her flank feels a little bit better but she is having more pain radiating under her left breast.  Worse with movement and deep breath.  She tried a laxative and had a normal bowel movement but it has not changed her pain.  No urinary symptoms.  Rates this pain as 10 out of 10 especially with movement and deep breath.  No clear trauma but she was doing lat pulldown exercises  The history is provided by the patient.  Chest Pain Pain location:  L lateral chest Pain quality: aching   Pain radiates to:  Mid back Pain severity:  Severe Onset quality:  Gradual Duration:  4 days Timing:  Constant Progression:  Unchanged Chronicity:  New Relieved by:  Nothing Worsened by:  Deep breathing and movement Ineffective treatments: nsaidsm muscle relaxant. Associated symptoms: back pain   Associated symptoms: no abdominal pain, no cough, no diaphoresis, no dysphagia, no fever, no nausea, no shortness of breath and no vomiting   Risk factors: diabetes mellitus, hypertension and smoking        Home Medications Prior to Admission medications   Medication Sig Start Date End Date Taking? Authorizing Provider  albuterol  (VENTOLIN  HFA) 108 (90 Base) MCG/ACT inhaler Inhale 2 puffs into the lungs every 6 (six) hours as needed for wheezing or shortness of breath. 01/17/23   Nche, Connye Delaine, NP  atorvastatin  (LIPITOR) 20 MG tablet Take 1 tablet (20 mg total) by  mouth every evening. 05/30/23   Nche, Connye Delaine, NP  buPROPion  (WELLBUTRIN  SR) 150 MG 12 hr tablet Take 1 tablet (150 mg total) by mouth daily. 08/27/23   Rayburn, Evangelina Hilt, PA-C  estradiol  (ESTRACE ) 0.1 MG/GM vaginal cream Place 0.5g nightly for two weeks then twice a week after 07/28/23   Wyonia Hefty T, MD  gabapentin  (NEURONTIN ) 100 MG capsule Take 1 capsule (100 mg total) by mouth at bedtime. 09/10/23   Nche, Connye Delaine, NP  ibuprofen  (ADVIL ) 800 MG tablet Take 1 tablet (800 mg total) by mouth every 8 (eight) hours as needed for moderate pain (pain score 4-6). 09/17/23   Kreg Pesa, PA-C  lidocaine  (XYLOCAINE ) 5 % ointment Use 0.5g peasize at the vaginal opening and in the vagina up to 3 times a day 07/25/23   Wyonia Hefty T, MD  metFORMIN  (GLUCOPHAGE ) 500 MG tablet Take 1 tablet (500 mg total) by mouth daily. 1/2 tablet with evening meal for 1 week and if tolerating well, increase to 1 tablet with evening meal. 08/27/23 09/26/23  Rayburn, Evangelina Hilt, PA-C  methocarbamol  (ROBAXIN ) 500 MG tablet Take 1 tablet (500 mg total) by mouth 3 (three) times daily as needed for muscle spasms. 09/17/23   White, Elizabeth A, PA-C  nicotine  (NICODERM CQ  - DOSED IN MG/24 HOURS) 21 mg/24hr patch Place 1 patch (21 mg total) onto the skin daily. 08/27/23   Rayburn, Evangelina Hilt, PA-C  Semaglutide -Weight Management (WEGOVY )  0.5 MG/0.5ML SOAJ Inject 0.5 mg into the skin once a week. 09/10/23   Rayburn, Evangelina Hilt, PA-C  Vitamin D , Ergocalciferol , (DRISDOL ) 1.25 MG (50000 UNIT) CAPS capsule Take 1 capsule (50,000 Units total) by mouth every 7 (seven) days. 08/27/23   Rayburn, Evangelina Hilt, PA-C      Allergies    Patient has no known allergies.    Review of Systems   Review of Systems  Constitutional:  Negative for diaphoresis and fever.  HENT:  Negative for trouble swallowing.   Respiratory:  Negative for cough and shortness of breath.   Cardiovascular:  Positive for chest pain.   Gastrointestinal:  Negative for abdominal pain, nausea and vomiting.  Genitourinary:  Positive for flank pain.  Musculoskeletal:  Positive for back pain.    Physical Exam Updated Vital Signs BP (!) 166/89 (BP Location: Right Arm)   Pulse 63   Temp 98.1 F (36.7 C)   Resp 20   SpO2 100%  Physical Exam Vitals and nursing note reviewed.  Constitutional:      General: She is not in acute distress.    Appearance: Normal appearance. She is well-developed.  HENT:     Head: Normocephalic and atraumatic.  Eyes:     Conjunctiva/sclera: Conjunctivae normal.  Cardiovascular:     Rate and Rhythm: Normal rate and regular rhythm.     Heart sounds: No murmur heard. Pulmonary:     Effort: Pulmonary effort is normal. No respiratory distress.     Breath sounds: Normal breath sounds. No stridor. No wheezing.  Chest:    Abdominal:     Palpations: Abdomen is soft.     Tenderness: There is no abdominal tenderness. There is no guarding or rebound.  Musculoskeletal:        General: No tenderness or deformity. Normal range of motion.     Cervical back: Neck supple.  Skin:    General: Skin is warm and dry.  Neurological:     General: No focal deficit present.     Mental Status: She is alert.     GCS: GCS eye subscore is 4. GCS verbal subscore is 5. GCS motor subscore is 6.     Sensory: No sensory deficit.     Motor: No weakness.     ED Results / Procedures / Treatments   Labs (all labs ordered are listed, but only abnormal results are displayed) Labs Reviewed  CBC - Abnormal; Notable for the following components:      Result Value   WBC 11.8 (*)    RBC 5.57 (*)    MCV 78.5 (*)    MCH 24.8 (*)    RDW 15.9 (*)    All other components within normal limits  BASIC METABOLIC PANEL WITH GFR    EKG None  Radiology DG Abd 1 View Result Date: 09/19/2023 CLINICAL DATA:  Left-sided abdominal pain EXAM: ABDOMEN - 1 VIEW COMPARISON:  12/27/2015, CT 11/14/2021 FINDINGS: The bowel gas  pattern is normal. No radio-opaque calculi or other significant radiographic abnormality are seen. Moderate to large stool burden. IMPRESSION: Nonobstructed gas pattern. Moderate to large stool burden. Electronically Signed   By: Esmeralda Hedge M.D.   On: 09/19/2023 16:12    Procedures Procedures  {Document cardiac monitor, telemetry assessment procedure when appropriate:1}  Medications Ordered in ED Medications - No data to display  ED Course/ Medical Decision Making/ A&P   {   Click here for ABCD2, HEART and other calculatorsREFRESH Note before signing :1}  Medical Decision Making Amount and/or Complexity of Data Reviewed Labs: ordered.   This patient complains of ***; this involves an extensive number of treatment Options and is a complaint that carries with it a high risk of complications and morbidity. The differential includes ***  I ordered, reviewed and interpreted labs, which included *** I ordered medication *** and reviewed PMP when indicated. I ordered imaging studies which included *** and I independently    visualized and interpreted imaging which showed *** Additional history obtained from *** Previous records obtained and reviewed *** I consulted *** and discussed lab and imaging findings and discussed disposition.  Cardiac monitoring reviewed, *** Social determinants considered, *** Critical Interventions: ***  After the interventions stated above, I reevaluated the patient and found *** Admission and further testing considered, ***   {Document critical care time when appropriate:1} {Document review of labs and clinical decision tools ie heart score, Chads2Vasc2 etc:1}  {Document your independent review of radiology images, and any outside records:1} {Document your discussion with family members, caretakers, and with consultants:1} {Document social determinants of health affecting pt's care:1} {Document your decision making why  or why not admission, treatments were needed:1} Final Clinical Impression(s) / ED Diagnoses Final diagnoses:  None    Rx / DC Orders ED Discharge Orders     None

## 2023-09-20 NOTE — Discharge Instructions (Addendum)
 You were seen in the emergency department for some left flank and left chest pain.  You had blood work EKG chest x-ray that did not show an obvious explanation for your symptoms.  This is possibly muscular and should improve with time.  Please contact your primary care doctor for close follow-up.  Return to the emergency department if any worsening or concerning symptoms.

## 2023-09-20 NOTE — ED Triage Notes (Signed)
 Left flank/under left breast pain ,tender/sore/  Wed started with lower back strain on left side, tx with ibuprofen . Thursday the pain moved to under her left breast.  Yesterday seen by at urgent care, did xrays and labs and dx with constipation/gas. She took laxative and has had bm but pain still there.

## 2023-09-22 ENCOUNTER — Ambulatory Visit: Payer: Self-pay | Admitting: Nurse Practitioner

## 2023-09-22 ENCOUNTER — Ambulatory Visit (HOSPITAL_BASED_OUTPATIENT_CLINIC_OR_DEPARTMENT_OTHER)
Admission: RE | Admit: 2023-09-22 | Discharge: 2023-09-22 | Disposition: A | Discharge status: Home / Self Care | Source: Ambulatory Visit | Attending: Nurse Practitioner | Admitting: Nurse Practitioner

## 2023-09-22 ENCOUNTER — Encounter (HOSPITAL_BASED_OUTPATIENT_CLINIC_OR_DEPARTMENT_OTHER): Payer: Self-pay | Admitting: Radiology

## 2023-09-22 ENCOUNTER — Ambulatory Visit: Payer: Self-pay | Admitting: Emergency Medicine

## 2023-09-22 DIAGNOSIS — M85852 Other specified disorders of bone density and structure, left thigh: Secondary | ICD-10-CM | POA: Diagnosis not present

## 2023-09-22 DIAGNOSIS — Z780 Asymptomatic menopausal state: Secondary | ICD-10-CM | POA: Insufficient documentation

## 2023-09-22 DIAGNOSIS — Z1231 Encounter for screening mammogram for malignant neoplasm of breast: Secondary | ICD-10-CM

## 2023-09-22 DIAGNOSIS — M8580 Other specified disorders of bone density and structure, unspecified site: Secondary | ICD-10-CM | POA: Insufficient documentation

## 2023-09-22 NOTE — Progress Notes (Signed)
"  Dear Ms. Elizabeth Best, as we discussed during your visit your abdominal x-ray reveals moderate to large stool burden which is consistent with constipation and the most likely cause of your abdominal pain.  I hope that the bowel cleansing regimen I recommended has been helpful and is relieved your abdominal pain.  Also as we discussed, if it has not relieved your abdominal pain, more advanced imaging such as abdominal ultrasound or CT scan of your abdomen may be indicated.  Please reach out to your primary care provider to discuss this if you are still experiencing discomfort."

## 2023-09-23 ENCOUNTER — Ambulatory Visit (INDEPENDENT_AMBULATORY_CARE_PROVIDER_SITE_OTHER)

## 2023-09-23 ENCOUNTER — Encounter: Payer: Self-pay | Admitting: Physician Assistant

## 2023-09-23 VITALS — BP 168/78 | HR 69 | Temp 97.9°F | Resp 16 | Ht 63.0 in | Wt 192.6 lb

## 2023-09-23 DIAGNOSIS — D509 Iron deficiency anemia, unspecified: Secondary | ICD-10-CM | POA: Diagnosis not present

## 2023-09-23 DIAGNOSIS — D508 Other iron deficiency anemias: Secondary | ICD-10-CM

## 2023-09-23 MED ORDER — SODIUM CHLORIDE 0.9 % IV SOLN
300.0000 mg | INTRAVENOUS | Status: DC
  Administered 2023-09-23: 300 mg via INTRAVENOUS
  Filled 2023-09-23: qty 15

## 2023-09-23 NOTE — Progress Notes (Signed)
 Diagnosis: Iron  Deficiency Anemia  Provider:  Praveen Mannam MD  Procedure: IV Infusion  IV Type: Peripheral, IV Location: R Forearm  Venofer  (Iron  Sucrose), Dose: 300 mg  Infusion Start Time: 1414  Infusion Stop Time: 1555  Post Infusion IV Care: Patient declined observation  Discharge: Condition: Good, Destination: Home . AVS Provided  Performed by:  Rachelle Bue, RN

## 2023-09-24 ENCOUNTER — Encounter: Payer: Self-pay | Admitting: Physician Assistant

## 2023-09-25 ENCOUNTER — Encounter: Payer: Self-pay | Admitting: Physician Assistant

## 2023-09-26 ENCOUNTER — Other Ambulatory Visit (INDEPENDENT_AMBULATORY_CARE_PROVIDER_SITE_OTHER): Payer: Self-pay | Admitting: Physician Assistant

## 2023-09-26 DIAGNOSIS — E559 Vitamin D deficiency, unspecified: Secondary | ICD-10-CM

## 2023-09-30 ENCOUNTER — Ambulatory Visit (INDEPENDENT_AMBULATORY_CARE_PROVIDER_SITE_OTHER)

## 2023-09-30 VITALS — BP 143/84 | HR 71 | Temp 98.2°F | Resp 18 | Ht 63.0 in | Wt 189.2 lb

## 2023-09-30 DIAGNOSIS — D509 Iron deficiency anemia, unspecified: Secondary | ICD-10-CM

## 2023-09-30 DIAGNOSIS — D508 Other iron deficiency anemias: Secondary | ICD-10-CM

## 2023-09-30 MED ORDER — SODIUM CHLORIDE 0.9 % IV SOLN
300.0000 mg | INTRAVENOUS | Status: DC
  Administered 2023-09-30: 300 mg via INTRAVENOUS
  Filled 2023-09-30: qty 15

## 2023-09-30 NOTE — Progress Notes (Signed)
 Diagnosis: Iron  Deficiency Anemia  Provider:  Mannam, Praveen MD  Procedure: IV Infusion  IV Type: Peripheral, IV Location: R Forearm  Venofer  (Iron  Sucrose), Dose: 300 mg  Infusion Start Time: 1335  Infusion Stop Time: 1528  Post Infusion IV Care: Patient declined observation and Peripheral IV Discontinued  Discharge: Condition: Good, Destination: Home . AVS Provided  Performed by:  Lendel Quant, RN

## 2023-10-03 ENCOUNTER — Encounter: Payer: Self-pay | Admitting: Physician Assistant

## 2023-10-07 ENCOUNTER — Ambulatory Visit (INDEPENDENT_AMBULATORY_CARE_PROVIDER_SITE_OTHER): Admitting: Physician Assistant

## 2023-10-08 ENCOUNTER — Ambulatory Visit (INDEPENDENT_AMBULATORY_CARE_PROVIDER_SITE_OTHER): Admitting: *Deleted

## 2023-10-08 VITALS — BP 161/88 | HR 85 | Temp 98.0°F | Resp 16 | Ht 63.0 in | Wt 189.8 lb

## 2023-10-08 DIAGNOSIS — D509 Iron deficiency anemia, unspecified: Secondary | ICD-10-CM | POA: Diagnosis not present

## 2023-10-08 DIAGNOSIS — D508 Other iron deficiency anemias: Secondary | ICD-10-CM

## 2023-10-08 MED ORDER — SODIUM CHLORIDE 0.9 % IV SOLN
300.0000 mg | INTRAVENOUS | Status: DC
  Administered 2023-10-08: 300 mg via INTRAVENOUS
  Filled 2023-10-08: qty 15

## 2023-10-08 NOTE — Progress Notes (Signed)
 Diagnosis: Iron  Deficiency Anemia  Provider:  Praveen Mannam MD  Procedure: IV Infusion  IV Type: Peripheral, IV Location: R Forearm  Venofer  (Iron  Sucrose), Dose: 300 mg  Infusion Start Time: 1341 pm  Infusion Stop Time: 1540 pm  Post Infusion IV Care: Observation period completed  Discharge: Condition: Good, Destination: Home . AVS Declined  Performed by:  Rachelle Bue, RN

## 2023-10-09 ENCOUNTER — Encounter (INDEPENDENT_AMBULATORY_CARE_PROVIDER_SITE_OTHER): Payer: Self-pay | Admitting: Family Medicine

## 2023-10-09 ENCOUNTER — Ambulatory Visit (INDEPENDENT_AMBULATORY_CARE_PROVIDER_SITE_OTHER): Admitting: Family Medicine

## 2023-10-09 VITALS — BP 118/78 | HR 76 | Temp 97.9°F | Ht 63.0 in | Wt 186.0 lb

## 2023-10-09 DIAGNOSIS — Z6832 Body mass index (BMI) 32.0-32.9, adult: Secondary | ICD-10-CM | POA: Diagnosis not present

## 2023-10-09 DIAGNOSIS — F3289 Other specified depressive episodes: Secondary | ICD-10-CM

## 2023-10-09 DIAGNOSIS — E669 Obesity, unspecified: Secondary | ICD-10-CM | POA: Diagnosis not present

## 2023-10-09 DIAGNOSIS — R7303 Prediabetes: Secondary | ICD-10-CM | POA: Diagnosis not present

## 2023-10-09 DIAGNOSIS — I700 Atherosclerosis of aorta: Secondary | ICD-10-CM | POA: Diagnosis not present

## 2023-10-09 DIAGNOSIS — Z6833 Body mass index (BMI) 33.0-33.9, adult: Secondary | ICD-10-CM

## 2023-10-09 DIAGNOSIS — F5089 Other specified eating disorder: Secondary | ICD-10-CM

## 2023-10-09 DIAGNOSIS — E559 Vitamin D deficiency, unspecified: Secondary | ICD-10-CM | POA: Diagnosis not present

## 2023-10-09 DIAGNOSIS — E88819 Insulin resistance, unspecified: Secondary | ICD-10-CM

## 2023-10-09 DIAGNOSIS — R632 Polyphagia: Secondary | ICD-10-CM | POA: Insufficient documentation

## 2023-10-09 MED ORDER — VITAMIN D (ERGOCALCIFEROL) 1.25 MG (50000 UNIT) PO CAPS: 50000.0000 [IU] | ORAL_CAPSULE | ORAL | 0 refills | Status: DC

## 2023-10-09 MED ORDER — METFORMIN HCL 500 MG PO TABS: 500.0000 mg | ORAL_TABLET | Freq: Every day | ORAL | 0 refills | Status: DC

## 2023-10-09 MED ORDER — WEGOVY 0.5 MG/0.5ML ~~LOC~~ SOAJ: 0.5000 mg | SUBCUTANEOUS | 0 refills | Status: DC

## 2023-10-09 NOTE — Progress Notes (Signed)
 Office: [phone]  /  Fax: [phone]  WEIGHT SUMMARY AND BIOMETRICS  Anthropometric Measurements Height: 5' 3 (1.6 m) Weight: 186 lb (84.4 kg) BMI (Calculated): 32.96 Weight at Last Visit: 193 lb Weight Lost Since Last Visit: 7 lb Weight Gained Since Last Visit: 0 Starting Weight: 189 lb Total Weight Loss (lbs): 3 lb (1.361 kg) Peak Weight: 200 lb   Body Composition  Body Fat %: 40.5 % Fat Mass (lbs): 75.4 lbs Muscle Mass (lbs): 105.2 lbs Total Body Water (lbs): 69.2 lbs Visceral Fat Rating : 12   Other Clinical Data Fasting: yes Labs: no Today's Visit #: 5 Starting Date: 06/25/23    Chief Complaint: OBESITY    History of Present Illness Elizabeth Best is a 68 year old female who presents for a follow-up on her obesity treatment plan.  She is adhering to the capillary two eating plan approximately 70% of the time and has incorporated walking her dog for 30 minutes, four times a week into her routine. She has lost seven pounds in the last six weeks.  She experienced a back injury last month after a fall while handling trash and boxes, which has limited her exercise. She visited urgent care and received pain medication and muscle relaxers. Her back is improving, and she describes the injury as muscle-related.  She feels full more quickly than before, noting a decrease in her usual portion sizes. For example, she now consumes one egg and one pancake instead of two of each, and she is only able to eat one chicken leg and half of the pasta during meals. She attributes this change to the Wegovy  medication.  She is currently taking Wegovy , metformin , and vitamin D . She has not been taking Wellbutrin  or metformin  recently but plans to resume metformin . She takes Wegovy  on Fridays and has one dose left before her next refill.  She typically does not eat breakfast, consuming two cups of coffee instead. Occasionally, she has a protein shake or an egg and cheese  sandwich. She finds protein shakes costly but tries to incorporate them into her diet when possible.      PHYSICAL EXAM:  Blood pressure 118/78, pulse 76, temperature 97.9 F (36.6 C), height 5' 3 (1.6 m), weight 186 lb (84.4 kg), SpO2 95%. Body mass index is 32.95 kg/m.  DIAGNOSTIC DATA REVIEWED:  BMET    Component Value Date/Time   NA 142 09/20/2023 1523   NA 140 07/18/2020 1548   K 3.9 09/20/2023 1523   CL 106 09/20/2023 1523   CO2 23 09/20/2023 1523   GLUCOSE 71 09/20/2023 1523   BUN 12 09/20/2023 1523   BUN 12 07/18/2020 1548   CREATININE 0.79 09/20/2023 1523   CREATININE 0.63 09/17/2023 1242   CREATININE 0.74 12/27/2015 1551   CALCIUM  9.9 09/20/2023 1523   GFRNONAA >60 09/20/2023 1523   GFRNONAA >60 09/17/2023 1242   GFRNONAA 74 06/17/2015 0948   GFRAA 95 12/15/2019 1723   GFRAA 86 06/17/2015 0948   Lab Results  Component Value Date   HGBA1C 5.5 06/25/2023   HGBA1C 5.8 (H) 07/18/2014   Lab Results  Component Value Date   INSULIN  17.4 06/25/2023   Lab Results  Component Value Date   TSH 0.633 06/25/2023   CBC    Component Value Date/Time   WBC 11.8 (H) 09/20/2023 1523   RBC 5.57 (H) 09/20/2023 1523   HGB 13.8 09/20/2023 1523   HGB 13.6 09/17/2023 1242   HGB 11.8 10/17/2020 1609   HCT  43.7 09/20/2023 1523   HCT 39.1 10/17/2020 1609   PLT 392 09/20/2023 1523   PLT 350 09/17/2023 1242   PLT 296 10/17/2020 1609   MCV 78.5 (L) 09/20/2023 1523   MCV 70 (L) 10/17/2020 1609   MCH 24.8 (L) 09/20/2023 1523   MCHC 31.6 09/20/2023 1523   RDW 15.9 (H) 09/20/2023 1523   RDW 24.6 (H) 10/17/2020 1609   Iron  Studies    Component Value Date/Time   IRON  37 09/17/2023 1242   IRON  48 10/17/2020 1609   TIBC 367 09/17/2023 1242   TIBC 370 10/17/2020 1609   FERRITIN 61 09/17/2023 1242   FERRITIN 327 (H) 10/17/2020 1609   IRONPCTSAT 10 (L) 09/17/2023 1242   IRONPCTSAT 6 (L) 01/03/2023 1402   Lipid Panel     Component Value Date/Time   CHOL 229 (H)  08/05/2022 1040   CHOL 219 (H) 10/17/2020 1609   TRIG 189.0 (H) 08/05/2022 1040   HDL 33.90 (L) 08/05/2022 1040   HDL 34 (L) 10/17/2020 1609   CHOLHDL 7 08/05/2022 1040   VLDL 37.8 08/05/2022 1040   LDLCALC 157 (H) 08/05/2022 1040   LDLCALC 149 (H) 10/17/2020 1609   Hepatic Function Panel     Component Value Date/Time   PROT 7.8 09/17/2023 1242   PROT 7.3 07/18/2020 1548   ALBUMIN 4.4 09/17/2023 1242   ALBUMIN 4.4 07/18/2020 1548   AST 11 (L) 09/17/2023 1242   ALT 15 09/17/2023 1242   ALKPHOS 81 09/17/2023 1242   BILITOT 0.3 09/17/2023 1242   BILIDIR <0.1 11/14/2021 1951   IBILI NOT CALCULATED 11/14/2021 1951      Component Value Date/Time   TSH 0.633 06/25/2023 0838   Nutritional Lab Results  Component Value Date   VD25OH 24.0 (L) 06/25/2023   VD25OH 20.35 (L) 01/03/2023   VD25OH 18.16 (L) 08/05/2022     Assessment and Plan Assessment & Plan Obesity and Aortic Atherosclerosis She is adhering to the capillary two eating plan 70% of the time and has lost 7 pounds in the last 6 weeks. Engages in 30 minutes of dog walking four times a week. Currently on Wegovy  0.5 mg, aiding hunger control. Concerns about decreased protein intake potentially leading to muscle breakdown and decreased metabolism. Advised to increase protein intake to at least 30 grams per meal to prevent malnutrition and maintain muscle mass. Increasing Wegovy  dosage is not recommended due to potential exacerbation of protein intake issues. - Continue Wegovy  at 0.5 mg. - Increase protein intake to at least 30 grams per meal. - Prioritize protein consumption, followed by vegetables, then other foods. - Monitor weight loss and protein intake.  Prediabetes She is prediabetic and has been prescribed metformin  to improve glucose levels and enhance Wegovy 's effectiveness. Advised to resume metformin  to optimize weight loss and glucose control, as it is beneficial even without noticeable effects. - Refill  metformin  prescription. - Resume metformin  as prescribed.  Emotional Eating Behavior Emotional eating behavior has improved with Wegovy . She has independently discontinued Wellbutrin , and this is agreed upon as long as emotional eating remains controlled. - Discontinue Wellbutrin . - Monitor emotional eating behavior.  Vitamin D  Deficiency Managed with weekly vitamin D  supplementation. - Continue weekly vitamin D  supplementation.    She was informed of the importance of frequent follow up visits to maximize her success with intensive lifestyle modifications for her multiple health conditions.    Jasmine Mesi, MD

## 2023-10-27 ENCOUNTER — Ambulatory Visit: Admitting: Obstetrics

## 2023-11-01 ENCOUNTER — Other Ambulatory Visit (INDEPENDENT_AMBULATORY_CARE_PROVIDER_SITE_OTHER): Payer: Self-pay | Admitting: Family Medicine

## 2023-11-01 DIAGNOSIS — I700 Atherosclerosis of aorta: Secondary | ICD-10-CM

## 2023-11-01 DIAGNOSIS — E88819 Insulin resistance, unspecified: Secondary | ICD-10-CM

## 2023-11-03 ENCOUNTER — Other Ambulatory Visit (INDEPENDENT_AMBULATORY_CARE_PROVIDER_SITE_OTHER): Payer: Self-pay | Admitting: Family Medicine

## 2023-11-03 ENCOUNTER — Encounter: Payer: Self-pay | Admitting: Obstetrics

## 2023-11-03 ENCOUNTER — Ambulatory Visit (INDEPENDENT_AMBULATORY_CARE_PROVIDER_SITE_OTHER): Admitting: Obstetrics

## 2023-11-03 VITALS — BP 134/76 | HR 80

## 2023-11-03 DIAGNOSIS — N952 Postmenopausal atrophic vaginitis: Secondary | ICD-10-CM

## 2023-11-03 DIAGNOSIS — F17209 Nicotine dependence, unspecified, with unspecified nicotine-induced disorders: Secondary | ICD-10-CM

## 2023-11-03 DIAGNOSIS — Z6834 Body mass index (BMI) 34.0-34.9, adult: Secondary | ICD-10-CM

## 2023-11-03 DIAGNOSIS — G629 Polyneuropathy, unspecified: Secondary | ICD-10-CM

## 2023-11-03 DIAGNOSIS — R351 Nocturia: Secondary | ICD-10-CM

## 2023-11-03 DIAGNOSIS — E88819 Insulin resistance, unspecified: Secondary | ICD-10-CM

## 2023-11-03 DIAGNOSIS — R102 Pelvic and perineal pain: Secondary | ICD-10-CM | POA: Diagnosis not present

## 2023-11-03 NOTE — Assessment & Plan Note (Signed)
-   insomnia with high caffeine intake, improved to 2x/night pt reduced caffeine use to 16oz/day. Patient now denies bother and desires to proceed with expectant management - avoid fluid intake 3 hrs before bedtime - elevated feet during the day or use compression socks to reduce lower extremity swelling - reports snoring with negative sleep study in 2017, consider repeat if no improvement with caffeine reduction to r/o sleep apnea

## 2023-11-03 NOTE — Assessment & Plan Note (Signed)
-   Nuswab negative 07/25/23 - resolved since titration of gabapentin  use as Rx dose rather than PRN - previously provided handout regarding vulvodynia, reviewed comfort measures and treatment options.  - Rx topical lidocaine  1g PRN pain up to 3x/day - encouraged conservative management options with cold compress  - lubrication use during intercourse - declines referral for pelvic floor PT  - discussed proper vulvar care, warm compression, avoid pad use, cotton only underwear and barrier ointment if needed

## 2023-11-03 NOTE — Assessment & Plan Note (Signed)
-   h/o back pain with sciatica, cervical pain that radiates to her spine since 1985 or 1989 with prior hospitalization every few years, last in 2007-2008 treated with narcotics and muscle relaxants.  - participates in NA with history of substance abuse - R sided LE neuropathic pain with R sided vulvar and vaginal pain improved with scheduled Gabepentin 200mg  qam and 200mg  qpm, prior PRN use for foot pain only which reduces when she is not working - encouraged to use gabapentin  100mg  earlier in the night to assess daytime sedation - Cr 0.9, encouraged NSAID use

## 2023-11-03 NOTE — Assessment & Plan Note (Signed)
-   encouraged to continue tobacco cessation and starting Chantix  due to history of COPD - discussed prior attempts to quit - pt reports 6-8 cigarette use/day, denies use of entire cigarette/day

## 2023-11-03 NOTE — Patient Instructions (Signed)
 Good work! Keep working hard with your weight reduction!  Continue to monitor your caffeine intake and cigarette use.   Please call if you experience any change in vaginal or urinary symptoms.

## 2023-11-03 NOTE — Assessment & Plan Note (Signed)
-   started Wegovy  and joined [employer], 4lb weight loss - discussed association of elevated BMI with pelvic floor disorders  - discussed 5-8% weight loss can reduce 45-50% of urinary incontinence  - encouraged to continue exercises 3x/week and continue active lifestyle

## 2023-11-03 NOTE — Progress Notes (Signed)
 [facility] Return Visit  SUBJECTIVE  History of Present Illness: Elizabeth Best is a 68 y.o. female seen in follow-up for nocturia, vaginal pain, vaginal atrophy, tobacco use. Plan at last visit was caffeine reduction, fluid management, topical lidocaine , pelvic flor PT referral, titration of gabapentin ,  low dose vaginal estrogen, and tobacco cessation.   Reports doing well with 90% improvement.  Started vaginal estrogen with breast sensitivity after 2 doses and stopped.  Started metfomin and Wegovy  with improved symptoms Voids 2x/night Drinks: 32oz water per day, 32 oz of coffee/day down to 16oz, 8oz unsweetened tea, 7.5oz pepsi intermittently  Exercising and going to [employer] Denies vaginal pain, increased gabapentin  to 2 tabs in the morning and 1 tab at night Uses 8 cigarettes/day  Does not recall referral for pelvic floor PT  Past Medical History: Patient  has a past medical history of Anemia, Anxiety, Arthritis, Blood transfusion without reported diagnosis, COPD (chronic obstructive pulmonary disease) (HCC), Depression, Dyspnea, Fibroid, Hyperlipidemia, Hypertension, Iron  deficiency anemia, Lactose intolerance, Osteopenia, Sciatica, SOB (shortness of breath), and Substance abuse (HCC).   Past Surgical History: She  has a past surgical history that includes Cesarean section; Breast surgery (1995); Abdominal hysterectomy (04/22/1993); Brain surgery; Colonoscopy with propofol  (N/A, 08/18/2020); Hot hemostasis (N/A, 08/18/2020); enteroscopy (N/A, 08/18/2020); biopsy (08/18/2020); Hemostasis clip placement (08/18/2020); and polypectomy (08/18/2020).   Medications: She has a current medication list which includes the following prescription(s): atorvastatin , gabapentin , hydrocodone -acetaminophen , ibuprofen , metformin , methocarbamol , nicotine , wegovy , and vitamin d  (ergocalciferol ).   Allergies: Patient has no known allergies.   Social History: Patient  reports that she has  been smoking cigarettes. She started smoking about 36 years ago. She has a 18.1 pack-year smoking history. She has never used smokeless tobacco. She reports that she does not drink alcohol and does not use drugs.     OBJECTIVE     Physical Exam: Vitals:   11/03/23 1012 11/03/23 1049  BP: (!) 141/80 134/76  Pulse: 79 80   Gen: No apparent distress, A&O x 3.  Detailed Urogynecologic Evaluation:  Deferred.       ASSESSMENT AND PLAN    Ms. Malachowski is a 68 y.o. with:  1. Nocturia   2. Vaginal pain   3. Neuropathy   4. Vaginal atrophy   5. BMI 34.0-34.9,adult   6. Tobacco use disorder, continuous     Nocturia Assessment & Plan: - insomnia with high caffeine intake, improved to 2x/night pt reduced caffeine use to 16oz/day. Patient now denies bother and desires to proceed with expectant management - avoid fluid intake 3 hrs before bedtime - elevated feet during the day or use compression socks to reduce lower extremity swelling - reports snoring with negative sleep study in 2017, consider repeat if no improvement with caffeine reduction to r/o sleep apnea     Vaginal pain Assessment & Plan: - Nuswab negative 07/25/23 - resolved since titration of gabapentin  use as Rx dose rather than PRN - previously provided handout regarding vulvodynia, reviewed comfort measures and treatment options.  - Rx topical lidocaine  1g PRN pain up to 3x/day - encouraged conservative management options with cold compress  - lubrication use during intercourse - declines referral for pelvic floor PT  - discussed proper vulvar care, warm compression, avoid pad use, cotton only underwear and barrier ointment if needed    Neuropathy Assessment & Plan: - h/o back pain with sciatica, cervical pain that radiates to her spine since 1985 or 1989 with prior hospitalization every few years, last in  2007-2008 treated with narcotics and muscle relaxants.  - participates in NA with history of substance abuse - R  sided LE neuropathic pain with R sided vulvar and vaginal pain improved with scheduled Gabepentin 200mg  qam and 200mg  qpm, prior PRN use for foot pain only which reduces when she is not working - encouraged to use gabapentin  100mg  earlier in the night to assess daytime sedation - Cr 0.9, encouraged NSAID use    Vaginal atrophy Assessment & Plan: - surgical menopause at 68yo per pt - reports dryness during intercourse - For symptomatic vaginal atrophy options include lubrication with a water-based lubricant, personal hygiene measures and barrier protection against wetness, and estrogen replacement in the form of vaginal cream, vaginal tablets, or a time-released vaginal ring.   - trial of low dose vaginal estrogen with breast tenderness and discontinued. Encouraged pt to resume at lower dose 0.5g twice a week for dryness during intercourse if desired   BMI 34.0-34.9,adult Assessment & Plan: - started Wegovy  and joined [employer], 4lb weight loss - discussed association of elevated BMI with pelvic floor disorders  - discussed 5-8% weight loss can reduce 45-50% of urinary incontinence  - encouraged to continue exercises 3x/week and continue active lifestyle    Tobacco use disorder, continuous Assessment & Plan: - encouraged to continue tobacco cessation and starting Chantix  due to history of COPD - discussed prior attempts to quit - pt reports 6-8 cigarette use/day, denies use of entire cigarette/day    Lianne ONEIDA Gillis, MD

## 2023-11-03 NOTE — Assessment & Plan Note (Signed)
-   surgical menopause at 68yo per pt - reports dryness during intercourse - For symptomatic vaginal atrophy options include lubrication with a water-based lubricant, personal hygiene measures and barrier protection against wetness, and estrogen replacement in the form of vaginal cream, vaginal tablets, or a time-released vaginal ring.   - trial of low dose vaginal estrogen with breast tenderness and discontinued. Encouraged pt to resume at lower dose 0.5g twice a week for dryness during intercourse if desired

## 2023-11-03 NOTE — Assessment & Plan Note (Signed)
>>  ASSESSMENT AND PLAN FOR BMI 34.0-34.9,ADULT WRITTEN ON 11/03/2023 10:37 AM BY YUEN, HOI T, MD  - started Wegovy  and joined [employer], 4lb weight loss - discussed association of elevated BMI with pelvic floor disorders  - discussed 5-8% weight loss can reduce 45-50% of urinary incontinence  - encouraged to continue exercises 3x/week and continue active lifestyle

## 2023-11-08 ENCOUNTER — Other Ambulatory Visit (INDEPENDENT_AMBULATORY_CARE_PROVIDER_SITE_OTHER): Payer: Self-pay | Admitting: Family Medicine

## 2023-11-08 DIAGNOSIS — E559 Vitamin D deficiency, unspecified: Secondary | ICD-10-CM

## 2023-11-23 NOTE — Progress Notes (Unsigned)
 SUBJECTIVE: Discussed the use of AI scribe software for clinical note transcription with the patient, who gave verbal consent to proceed.  Chief Complaint: Obesity  Interim History: She is down 4 lbs since her last visit.  Down 7 lbs overall TBW loss of 3.7%  Elizabeth Best is here to discuss her progress with her obesity treatment plan. She is on the Category 2 Plan and states she is not following her eating plan approximately 0 % of the time. She states she is not exercising , but has started working and is moving/walking for 8 hours several days per week.   Discussed the use of AI scribe software for clinical note transcription with the patient, who gave verbal consent to proceed.  History of Present Illness Elizabeth Best is a 68 year old female with obesity and prediabetes who presents for follow-up on her obesity treatment plan.  She has been on Wegovy  0.5 mg weekly, which she reports has helped with hunger control. However, she ran out of Wegovy  nearly two weeks ago and has been without it since then. She previously tried bupropion  without success and discontinued it. She also takes metformin  for prediabetes and insulin  resistance, which helps reduce her appetite.  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 This has disrupted her diet, leading her to consume fast food or candy due to convenience. She has a preference for candy, particularly mini Milky Ways, Snickers, and M&M's, which she often includes in her lunch bag. Her diet is not ideal, with limited fruit and vegetable intake, and she struggles with cravings for sweets.  Her current medications include atorvastatin , metformin , and Wegovy . She also takes vitamin D , which she is currently out of. She has not yet started using nicotine  patches prescribed by her primary care provider.  In terms of dietary habits, she is not  a breakfast person and often skips meals, though she consumes [employer] protein shakes. She struggles with maintaining a balanced diet due to her work schedule and the convenience of fast food options. She wants to improve her diet and reduce sugar intake, particularly from candy.  She has lost seven pounds since starting Wegovy . She feels some discouragement about her weight loss progress, noting that others on Wegovy  seem to have lost more weight.    OBJECTIVE: Visit Diagnoses: Problem List Items Addressed This Visit     Vitamin D  deficiency   Relevant Medications   Vitamin D , Ergocalciferol , (DRISDOL ) 1.25 MG (50000 UNIT) CAPS capsule   Aortic atherosclerosis (HCC) - Primary   Relevant Medications   Semaglutide -Weight Management 1 MG/0.5ML SOAJ   Other Visit Diagnoses       Mixed hyperlipidemia         Prediabetes       Relevant Medications   Semaglutide -Weight Management 1 MG/0.5ML SOAJ     Insulin  resistance       Relevant Medications   metFORMIN  (GLUCOPHAGE ) 500 MG tablet     Obesity, starting BMI 33.48       Relevant Medications   Semaglutide -Weight Management 1 MG/0.5ML SOAJ   metFORMIN  (GLUCOPHAGE ) 500 MG tablet     BMI 32.0-32.9,adult Current BMI 32.3         Obesity  Obesity with associated prediabetes, insulin  resistance, hyperlipidemia, and aortic atherosclerosis. Currently on Wegovy  0.5 mg weekly, previously on bupropion  and metformin .  Reports Wegovy  aids hunger control but desires increased dosage due  to lack of significant weight loss/appetite control.  Hunger and cravings for candy have increased, impacting dietary adherence. Wegovy  and metformin  work synergistically to improve insulin  sensitivity and reduce cardiovascular risk. Gradual weight loss observed with reduction in body fat percentage from 42% to 39.8%. . Emphasis on gradual weight loss to prevent rapid regain and muscle loss. - Increase Wegovy  to 1 mg weekly - Continue metformin  - Refill  atorvastatin  - Provide dietary guidance to achieve 1300-1400 calories and 85 grams of protein daily - Encourage consumption of protein shakes and healthier snack options like [place] yogurt and beef jerky - Discuss risks of rapid dose escalation and importance of gradual weight loss Meds ordered this encounter  Medications   Semaglutide -Weight Management 1 MG/0.5ML SOAJ    Sig: Inject 1 mg into the skin once a week.    Dispense:  2 mL    Refill:  1   metFORMIN  (GLUCOPHAGE ) 500 MG tablet    Sig: Take 1 tablet (500 mg total) by mouth daily. 1/2 tablet with evening meal for 1 week and if tolerating well, increase to 1 tablet with evening meal.    Dispense:  30 tablet    Refill:  0   Vitamin D , Ergocalciferol , (DRISDOL ) 1.25 MG (50000 UNIT) CAPS capsule    Sig: Take 1 capsule (50,000 Units total) by mouth every 7 (seven) days.    Dispense:  5 capsule    Refill:  0     Aortic atherosclerosis/hyperlipidemia Known aortic plaque and hyperlipidemia: On Wegovy  0.5 mg weekly- Denies mass in neck, dysphagia, dyspepsia, persistent hoarseness, abdominal pain, or N/V/Constipation or diarrhea. Has annual eye exam. Mood is stable.  On Lipitor 20 mg daily. No report of SE.   Last lipids Lab Results  Component Value Date   CHOL 229 (H) 08/05/2022   HDL 33.90 (L) 08/05/2022   LDLCALC 157 (H) 08/05/2022   TRIG 189.0 (H) 08/05/2022   CHOLHDL 7 08/05/2022   Plan:  Continue Lipitor 20 mg daily and Wegovy  increased to 1 mg weekly Continue to work on nutrition plan -decreasing simple carbohydrates, increasing lean proteins, decreasing saturated fats and cholesterol , avoiding trans fats and exercise as able to promote weight loss, improve lipids and decrease cardiovascular risks. Recheck lipids with next fasting labs  Prediabetes with insulin  resistance Last A1c was 5.5- improved and at goal. Insulin  17.4- not at goal of < 5.   Medication(s): Wegovy  0.50 mg SQ weekly and Metformin  500 mg once daily  breakfast Denies mass in neck, dysphagia, dyspepsia, persistent hoarseness, abdominal pain, or N/V/Constipation or diarrhea. Has annual eye exam. Mood is stable.  No GI or other SE with metformin  Polyphagia:Yes Lab Results  Component Value Date   HGBA1C 5.5 06/25/2023   HGBA1C 5.4 01/03/2023   HGBA1C 5.7 (A) 09/26/2021   HGBA1C 5.2 07/18/2020   HGBA1C 5.3 12/15/2019   Lab Results  Component Value Date   INSULIN  17.4 06/25/2023    Plan: Continue and refill /increaseWegovy 1.0 mg SQ weekly and Metformin  500 mg once daily breakfast Continue working on nutrition plan to decrease simple carbohydrates, increase lean proteins and exercise to promote weight loss, improve glycemic control and prevent progression to Type 2 diabetes.    Vitamin D  deficiency Reports running out of vitamin D . Unclear if prescribed by primary care or current practice. Importance of maintaining vitamin D  levels discussed. Last level not at goal of 50-70. Last vitamin D  Lab Results  Component Value Date   VD25OH 24.0 (L) 06/25/2023  Continue and refill Ergocalciferol  50,000 unit once weekly -Low vitamin D  levels can be associated with adiposity and may result in leptin resistance and weight gain. Also associated with fatigue.  Currently on vitamin D  supplementation without any adverse effects such as nausea, vomiting or muscle weakness.    Vitals Temp: 98.1 F (36.7 C) BP: 123/79 Pulse Rate: 60 SpO2: 99 %   Anthropometric Measurements Height: 5' 3 (1.6 m) Weight: 182 lb (82.6 kg) BMI (Calculated): 32.25 Weight at Last Visit: 186lb Weight Lost Since Last Visit: 4lb Weight Gained Since Last Visit: 0 Starting Weight: 189lb Total Weight Loss (lbs): 7 lb (3.175 kg) Peak Weight: 200lb   Body Composition  Body Fat %: 39.8 % Fat Mass (lbs): 72.6 lbs Muscle Mass (lbs): 104 lbs Total Body Water (lbs): 70 lbs Visceral Fat Rating : 11   Other Clinical Data Fasting: no Labs: no Today's Visit #:  6 Starting Date: 06/25/23     ASSESSMENT AND PLAN:  Diet: Elizabeth Best is currently in the action stage of change. As such, her goal is to continue with weight loss efforts. She has agreed to Category 2 Plan.  Exercise: Elizabeth Best has been instructed to work up to a goal of 150 minutes of combined cardio and strengthening exercise per week for weight loss and overall health benefits.   Behavior Modification:  We discussed the following Behavioral Modification Strategies today: increasing lean protein intake, decreasing simple carbohydrates, increasing vegetables, increase H2O intake, increase high fiber foods, no skipping meals, meal planning and cooking strategies, better snacking choices, avoiding temptations, planning for success, and decrease junk food . We discussed various medication options to help Elizabeth Best with her weight loss efforts and we both agreed to increase Wegovy  to 1 mg weekly and continue all other current treatment plans.  Return in about 4 weeks (around 12/22/2023).Elizabeth Best She was informed of the importance of frequent follow up visits to maximize her success with intensive lifestyle modifications for her multiple health conditions.  Attestation Statements:   Reviewed by clinician on day of visit: allergies, medications, problem list, medical history, surgical history, family history, social history, and previous encounter notes.   Time spent on visit including pre-visit chart review and post-visit care and charting was 45 minutes.    Demetres Prochnow, PA-C

## 2023-11-24 ENCOUNTER — Ambulatory Visit (INDEPENDENT_AMBULATORY_CARE_PROVIDER_SITE_OTHER): Admitting: Physician Assistant

## 2023-11-24 ENCOUNTER — Encounter (INDEPENDENT_AMBULATORY_CARE_PROVIDER_SITE_OTHER): Payer: Self-pay | Admitting: Family Medicine

## 2023-11-24 VITALS — BP 123/79 | HR 60 | Temp 98.1°F | Ht 63.0 in | Wt 182.0 lb

## 2023-11-24 DIAGNOSIS — R7303 Prediabetes: Secondary | ICD-10-CM

## 2023-11-24 DIAGNOSIS — E559 Vitamin D deficiency, unspecified: Secondary | ICD-10-CM

## 2023-11-24 DIAGNOSIS — E669 Obesity, unspecified: Secondary | ICD-10-CM | POA: Diagnosis not present

## 2023-11-24 DIAGNOSIS — I700 Atherosclerosis of aorta: Secondary | ICD-10-CM | POA: Diagnosis not present

## 2023-11-24 DIAGNOSIS — E88819 Insulin resistance, unspecified: Secondary | ICD-10-CM

## 2023-11-24 DIAGNOSIS — E782 Mixed hyperlipidemia: Secondary | ICD-10-CM | POA: Diagnosis not present

## 2023-11-24 DIAGNOSIS — Z6832 Body mass index (BMI) 32.0-32.9, adult: Secondary | ICD-10-CM

## 2023-11-24 DIAGNOSIS — Z6833 Body mass index (BMI) 33.0-33.9, adult: Secondary | ICD-10-CM

## 2023-11-24 DIAGNOSIS — F3289 Other specified depressive episodes: Secondary | ICD-10-CM

## 2023-11-24 MED ORDER — SEMAGLUTIDE-WEIGHT MANAGEMENT 1 MG/0.5ML ~~LOC~~ SOAJ: 1.0000 mg | SUBCUTANEOUS | 1 refills | Status: DC

## 2023-11-24 MED ORDER — METFORMIN HCL 500 MG PO TABS
500.0000 mg | ORAL_TABLET | Freq: Every day | ORAL | 0 refills | Status: DC
Start: 2023-11-24 — End: 2023-12-29

## 2023-11-24 MED ORDER — VITAMIN D (ERGOCALCIFEROL) 1.25 MG (50000 UNIT) PO CAPS
50000.0000 [IU] | ORAL_CAPSULE | ORAL | 0 refills | Status: DC
Start: 2023-11-24 — End: 2023-12-29

## 2023-12-02 ENCOUNTER — Telehealth (INDEPENDENT_AMBULATORY_CARE_PROVIDER_SITE_OTHER): Payer: Self-pay | Admitting: *Deleted

## 2023-12-02 NOTE — Telephone Encounter (Signed)
 The drug Ozempic  1 mg /dose is not covered or is subject to the limits listed in the utilization management edit section. The [facility] covered patient received a temporary supply of drug. Patient has been notified about this prescription and transition policy.

## 2023-12-15 ENCOUNTER — Encounter (INDEPENDENT_AMBULATORY_CARE_PROVIDER_SITE_OTHER): Payer: Self-pay | Admitting: Physician Assistant

## 2023-12-16 ENCOUNTER — Other Ambulatory Visit (INDEPENDENT_AMBULATORY_CARE_PROVIDER_SITE_OTHER): Payer: Self-pay | Admitting: Physician Assistant

## 2023-12-16 DIAGNOSIS — R7303 Prediabetes: Secondary | ICD-10-CM

## 2023-12-16 DIAGNOSIS — E782 Mixed hyperlipidemia: Secondary | ICD-10-CM

## 2023-12-16 DIAGNOSIS — E66811 Obesity, class 1: Secondary | ICD-10-CM

## 2023-12-16 DIAGNOSIS — I700 Atherosclerosis of aorta: Secondary | ICD-10-CM

## 2023-12-16 MED ORDER — WEGOVY 1 MG/0.5ML ~~LOC~~ SOAJ: 1.0000 mg | SUBCUTANEOUS | 0 refills | Status: DC

## 2023-12-16 NOTE — Telephone Encounter (Signed)
 Offered patient an earlier appointment, per patient she can not come in sooner unless she can be soon early in the morning. She is not sure about the letter she received, she only know that it stated the provider would have to complete some information. She was at work and did not have access to a copy of the letter she received.

## 2023-12-23 ENCOUNTER — Other Ambulatory Visit (INDEPENDENT_AMBULATORY_CARE_PROVIDER_SITE_OTHER): Payer: Self-pay | Admitting: Physician Assistant

## 2023-12-23 DIAGNOSIS — E559 Vitamin D deficiency, unspecified: Secondary | ICD-10-CM

## 2023-12-29 ENCOUNTER — Ambulatory Visit (INDEPENDENT_AMBULATORY_CARE_PROVIDER_SITE_OTHER): Admitting: Physician Assistant

## 2023-12-29 ENCOUNTER — Encounter (INDEPENDENT_AMBULATORY_CARE_PROVIDER_SITE_OTHER): Payer: Self-pay | Admitting: Physician Assistant

## 2023-12-29 VITALS — BP 133/79 | HR 72 | Temp 97.8°F | Ht 63.0 in | Wt 176.0 lb

## 2023-12-29 DIAGNOSIS — Z6831 Body mass index (BMI) 31.0-31.9, adult: Secondary | ICD-10-CM

## 2023-12-29 DIAGNOSIS — I700 Atherosclerosis of aorta: Secondary | ICD-10-CM

## 2023-12-29 DIAGNOSIS — G629 Polyneuropathy, unspecified: Secondary | ICD-10-CM

## 2023-12-29 DIAGNOSIS — E782 Mixed hyperlipidemia: Secondary | ICD-10-CM

## 2023-12-29 DIAGNOSIS — F32A Depression, unspecified: Secondary | ICD-10-CM | POA: Diagnosis not present

## 2023-12-29 DIAGNOSIS — F3289 Other specified depressive episodes: Secondary | ICD-10-CM

## 2023-12-29 DIAGNOSIS — E66811 Obesity, class 1: Secondary | ICD-10-CM

## 2023-12-29 DIAGNOSIS — R7303 Prediabetes: Secondary | ICD-10-CM

## 2023-12-29 DIAGNOSIS — E559 Vitamin D deficiency, unspecified: Secondary | ICD-10-CM | POA: Diagnosis not present

## 2023-12-29 DIAGNOSIS — F5101 Primary insomnia: Secondary | ICD-10-CM

## 2023-12-29 DIAGNOSIS — E669 Obesity, unspecified: Secondary | ICD-10-CM

## 2023-12-29 MED ORDER — VITAMIN D (ERGOCALCIFEROL) 1.25 MG (50000 UNIT) PO CAPS: 50000.0000 [IU] | ORAL_CAPSULE | ORAL | 0 refills | Status: DC

## 2023-12-29 MED ORDER — METFORMIN HCL 500 MG PO TABS: 500.0000 mg | ORAL_TABLET | Freq: Every day | ORAL | 0 refills | Status: DC

## 2023-12-29 MED ORDER — BUPROPION HCL ER (SR) 150 MG PO TB12: 150.0000 mg | ORAL_TABLET | Freq: Every day | ORAL | 0 refills | Status: DC

## 2023-12-29 MED ORDER — WEGOVY 1 MG/0.5ML ~~LOC~~ SOAJ: 1.0000 mg | SUBCUTANEOUS | 0 refills | Status: DC

## 2023-12-29 MED ORDER — ATORVASTATIN CALCIUM 20 MG PO TABS: 20.0000 mg | ORAL_TABLET | Freq: Every evening | ORAL | 3 refills | Status: DC

## 2023-12-29 NOTE — Progress Notes (Signed)
 SUBJECTIVE: Discussed the use of AI scribe software for clinical note transcription with the patient, who gave verbal consent to proceed.  Chief Complaint: Obesity  Interim History: She is down 6 lbs since her last visit.  Down 13 lbs overall  Kadijah is here to discuss her progress with her obesity treatment plan. She is on the Category 2 Plan and states she is following her eating plan approximately 60 % of the time. She states she is exercising walking for 8 hours at work 5 times per week.  Dreana Britz is a 68 year old female who presents for follow-up of her obesity treatment plan.  She has been on semaglutide  1 mg weekly,and has lost six pounds since her last visit. She desires more rapid weight loss and we discussed healthier weight loss with maintenance of muscle mass and loss of adipose over rapid, non sustainable weight loss. Her job keeps her very active.  She has a history of aortic atherosclerosis with mixed hyperlipidemia and is currently taking atorvastatin  in addition to Wegovy . She accidentally discarded her atorvastatin  prescription and is unsure if it can be refilled. She is also on metformin  for prediabetes.  She has a history of vitamin D  deficiency and is currently out of her vitamin D  supplements. She also has a low B12 level.  She experiences insomnia, often sleeping only two to three hours per night, attributing this to stress and a busy mind. She previously underwent a sleep study in 2018 or 2019, which did not indicate significant sleep apnea. She has a history of taking bupropion  for stress and sleep issues but stopped earlier this year.  She reports neuropathic pain in her feet, particularly around the toes and ankles, which worsens with prolonged standing at work. She uses an ACE bandage for compression, which provides some relief. She is currently taking gabapentin  once a day for this condition. OBJECTIVE: Visit Diagnoses: Problem List Items Addressed  This Visit     Insomnia   Relevant Orders   Ambulatory referral to Pulmonology   Vitamin D  deficiency   Relevant Medications   Vitamin D , Ergocalciferol , (DRISDOL ) 1.25 MG (50000 UNIT) CAPS capsule   Depression   Relevant Medications   buPROPion  (WELLBUTRIN  SR) 150 MG 12 hr tablet   Generalized obesity   Relevant Medications   semaglutide -weight management (WEGOVY ) 1 MG/0.5ML SOAJ SQ injection   metFORMIN  (GLUCOPHAGE ) 500 MG tablet   Aortic atherosclerosis (HCC) - Primary   Relevant Medications   semaglutide -weight management (WEGOVY ) 1 MG/0.5ML SOAJ SQ injection   atorvastatin  (LIPITOR) 20 MG tablet   Other Visit Diagnoses       Mixed hyperlipidemia       Relevant Medications   semaglutide -weight management (WEGOVY ) 1 MG/0.5ML SOAJ SQ injection   atorvastatin  (LIPITOR) 20 MG tablet     Prediabetes       Relevant Medications   semaglutide -weight management (WEGOVY ) 1 MG/0.5ML SOAJ SQ injection   metFORMIN  (GLUCOPHAGE ) 500 MG tablet     BMI 31.0-31.9,adult Current BMI 31.2          Obesity, class 1 Currently on semaglutide  (Wegovy /Ozempic ) 1 mg weekly for weight management. Down 6 pounds, with a total weight loss of 13 pounds, approximately 7% of body weight, indicating metabolically significant improvements.  Potential future options include a pill form of Wegovy , which may be less expensive. - Continue semaglutide  (Wegovy /Ozempic ) 1 mg weekly - Continue current nutrition plan and activity to promote healthy weight loss  Aortic atherosclerosis with mixed hyperlipidemia and  pure hypercholesterolemia Managed with semaglutide  (Wegovy ) for hyperlipidemia and vascular concerns. Denies mass in neck, dysphagia, dyspepsia, persistent hoarseness, abdominal pain, or N/V/Constipation or diarrhea. Has annual eye exam. Mood is stable.   Atorvastatin  is prescribed but was accidentally discarded. No issues with atorvastatin  use. - Refill atorvastatin  prescription - Continue/refill  semaglutide  (Wegovy ) for hyperlipidemia Meds ordered this encounter  Medications   semaglutide -weight management (WEGOVY ) 1 MG/0.5ML SOAJ SQ injection    Sig: Inject 1 mg into the skin once a week.    Dispense:  2 mL    Refill:  0   atorvastatin  (LIPITOR) 20 MG tablet    Sig: Take 1 tablet (20 mg total) by mouth every evening.    Dispense:  90 tablet    Refill:  3   metFORMIN  (GLUCOPHAGE ) 500 MG tablet    Sig: Take 1 tablet (500 mg total) by mouth daily.    Dispense:  30 tablet    Refill:  0   Vitamin D , Ergocalciferol , (DRISDOL ) 1.25 MG (50000 UNIT) CAPS capsule    Sig: Take 1 capsule (50,000 Units total) by mouth every 7 (seven) days.    Dispense:  5 capsule    Refill:  0   buPROPion  (WELLBUTRIN  SR) 150 MG 12 hr tablet    Sig: Take 1 tablet (150 mg total) by mouth daily.    Dispense:  30 tablet    Refill:  0    Prediabetes Managed with metformin  500 mg once daily in addition to wegovy . Discussed potential to increase metformin  dosage if Wegovy  is discontinued. She is tolerating metformin  well. Lab Results  Component Value Date   HGBA1C 5.5 06/25/2023   HGBA1C 5.4 01/03/2023   HGBA1C 5.7 (A) 09/26/2021   Lab Results  Component Value Date   LDLCALC 157 (H) 08/05/2022   CREATININE 0.79 09/20/2023    - Continue metformin  500 mg once daily - Consider increasing metformin  dosage if Wegovy  is discontinued in the future.   Vitamin D  Deficiency Vitamin D  is not at goal of 50.  Most recent vitamin D  level was 24.0. She is on  prescription ergocalciferol  50,000 IU weekly. No N/V or muscle weaknes with Ergo   Lab Results  Component Value Date   VD25OH 24.0 (L) 06/25/2023   VD25OH 20.35 (L) 01/03/2023   VD25OH 18.16 (L) 08/05/2022    Plan: Continue and refill  prescription ergocalciferol  50,000 IU weekly Low vitamin D  levels can be associated with adiposity and may result in leptin resistance and weight gain. Also associated with fatigue.  Currently on vitamin D   supplementation without any adverse effects such as nausea, vomiting or muscle weakness.    Primary insomnia Experiencing significant insomnia, potentially related to stress and age. Previous sleep study indicated no significant sleep apnea. Discussed potential for home sleep study through pulmonary specialist. Caffeine intake and stress management strategies discussed. Bupropion  previously prescribed for stress-related insomnia, but she stopped taking it earlier this year. - Refer to pulmonary specialist for ?home sleep study as patient did not feel she could sleep or get proper results in sleep lab.  - Restart bupropion  150 mg once daily in the morning - Advise on caffeine reduction and stress management strategies  Depression Previously prescribed bupropion  for stress and depression-related symptoms. She stopped taking it earlier this year. Discussed restarting bupropion  to help with stress and coping. - Restart bupropion  150 mg once daily in the morning - Monitor for improvement in stress and coping  Peripheral neuropathy, unspecified Experiencing severe pain in  feet, particularly around toes and ankles, likely due to neuropathy. Gabapentin  prescribed but she is unsure of its purpose. Vitamin B12 level slightly low, which may contribute to neuropathy symptoms. Compression socks and B12 supplementation discussed as potential aids. - Recommend wearing compression socks - Advise on over-the-counter B12 supplementation (1000 mcg sublingual) - Recheck B12 level in a couple of months  Follow-Up Next appointment scheduled for October 6th. Discussed scheduling a follow-up appointment in November. - Schedule follow-up appointment for November 3rd at 11 AM - Plan for follow-up labs at next appointment, including A1c, vitamin levels, and kidney function Vitals Temp: 97.8 F (36.6 C) BP: 133/79 Pulse Rate: 72 SpO2: 99 %   Anthropometric Measurements Height: 5' 3 (1.6 m) Weight: 176 lb  (79.8 kg) BMI (Calculated): 31.18 Weight at Last Visit: 182lb Weight Lost Since Last Visit: 6lb Weight Gained Since Last Visit: 0 Starting Weight: 189lb Total Weight Loss (lbs): 13 lb (5.897 kg) Peak Weight: 200lb   Body Composition  Body Fat %: 38.7 % Fat Mass (lbs): 68.2 lbs Muscle Mass (lbs): 102.4 lbs Total Body Water (lbs): 68.2 lbs Visceral Fat Rating : 11   Other Clinical Data Fasting: no Labs: no Today's Visit #: 7 Starting Date: 06/25/23     ASSESSMENT AND PLAN:  Diet: Kristianna is currently in the action stage of change. As such, her goal is to continue with weight loss efforts. She has agreed to Category 2 Plan.  Exercise: Kamla has been instructed to work up to a goal of 150 minutes of combined cardio and strengthening exercise per week for weight loss and overall health benefits.   Behavior Modification:  We discussed the following Behavioral Modification Strategies today: increasing lean protein intake, decreasing simple carbohydrates, increasing vegetables, increase H2O intake, increase high fiber foods, no skipping meals, meal planning and cooking strategies, emotional eating strategies , avoiding temptations, and planning for success. We discussed various medication options to help Rozetta with her weight loss efforts and we both agreed to continue current treatment plan.  Return in about 4 weeks (around 01/26/2024).[name] She was informed of the importance of frequent follow up visits to maximize her success with intensive lifestyle modifications for her multiple health conditions.  Attestation Statements:   Reviewed by clinician on day of visit: allergies, medications, problem list, medical history, surgical history, family history, social history, and previous encounter notes.   Time spent on visit including pre-visit chart review and post-visit care and charting was 45 minutes.    Osmond Steckman, PA-C

## 2023-12-31 ENCOUNTER — Ambulatory Visit: Admitting: Dermatology

## 2024-01-06 ENCOUNTER — Ambulatory Visit (HOSPITAL_COMMUNITY)
Admission: EM | Admit: 2024-01-06 | Discharge: 2024-01-06 | Disposition: A | Discharge status: Home / Self Care | Attending: Family Medicine | Admitting: Family Medicine

## 2024-01-06 ENCOUNTER — Encounter (HOSPITAL_COMMUNITY): Payer: Self-pay

## 2024-01-06 DIAGNOSIS — R6889 Other general symptoms and signs: Secondary | ICD-10-CM | POA: Diagnosis not present

## 2024-01-06 DIAGNOSIS — J029 Acute pharyngitis, unspecified: Secondary | ICD-10-CM | POA: Diagnosis not present

## 2024-01-06 LAB — POC COVID19/FLU A&B COMBO
Covid Antigen, POC: NEGATIVE
Influenza A Antigen, POC: NEGATIVE
Influenza B Antigen, POC: NEGATIVE

## 2024-01-06 NOTE — ED Triage Notes (Signed)
 Pt states sore throat, headache and lightheadedness since yesterday.

## 2024-01-06 NOTE — Discharge Instructions (Signed)
 You may use over the counter ibuprofen  or acetaminophen  as needed.  For a sore throat, over the counter products such as [employer] Peroxyl Mouth Sore Rinse or Chloraseptic Sore Throat Spray may provide some temporary relief.  Results for orders placed or performed during the hospital encounter of 01/06/24  POC Covid19/Flu A&B Antigen   Collection Time: 01/06/24  5:24 PM  Result Value Ref Range   Influenza A Antigen, POC Negative Negative   Influenza B Antigen, POC Negative Negative   Covid Antigen, POC Negative Negative
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
## 2024-01-13 ENCOUNTER — Other Ambulatory Visit (INDEPENDENT_AMBULATORY_CARE_PROVIDER_SITE_OTHER): Payer: Self-pay | Admitting: Physician Assistant

## 2024-01-13 DIAGNOSIS — E66811 Obesity, class 1: Secondary | ICD-10-CM

## 2024-01-13 DIAGNOSIS — R7303 Prediabetes: Secondary | ICD-10-CM

## 2024-01-13 DIAGNOSIS — I700 Atherosclerosis of aorta: Secondary | ICD-10-CM

## 2024-01-25 NOTE — Progress Notes (Unsigned)
 SUBJECTIVE: Discussed the use of AI scribe software for clinical note transcription with the patient, who gave verbal consent to proceed.  Chief Complaint: Obesity  Interim History: She is down 3 lbs since her last visit.  Down 16 lbs overall.  TBW Loss of 8.5%  Elizabeth Best is here to discuss her progress with her obesity treatment plan. She is on the Category 2 Plan and states she is following her eating plan approximately 50 % of the time. She states she is not exercising but has high NEAT at work.   Elizabeth Best is a 68 year old female who presents for follow-up of her obesity treatment plan.  She is currently on Wegovy  1 mg per week and has lost three pounds since her last visit. She follows her category two plan about fifty percent of the time, eating more whole foods but not consistently consuming the prescribed amount of protein or drinking enough water. She occasionally skips meals, particularly in the mornings when she checks on her mother before work, leading to her first meal being delayed until her first break at work. She sometimes uses Premier protein shakes but not consistently in the morning.  She has a history of emotional eating and cravings, which sometimes leads her to consume candy during work hours. She has tried protein bars and yogurt, but finds some options too high in sugar.  Her current medications include atorvastatin , metformin , Wellbutrin , and vitamin D  supplements. She reports no issues with metformin  or Wellbutrin , which she takes in the morning.  She has a history of prediabetes, with her last hemoglobin A1c at 5.5 and an elevated insulin  level of 17.4. She is concerned about potential changes in her insurance coverage affecting her ability to afford medications.  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 She does not sleep seven to nine hours per night and occasionally skips meals. OBJECTIVE: Visit  Diagnoses: Problem List Items Addressed This Visit     Vitamin D  deficiency   Relevant Medications   Vitamin D , Ergocalciferol , (DRISDOL ) 1.25 MG (50000 UNIT) CAPS capsule   Depression   Relevant Medications   buPROPion  (WELLBUTRIN  SR) 150 MG 12 hr tablet   Generalized obesity   Relevant Medications   semaglutide -weight management (WEGOVY ) 1.7 MG/0.75ML SOAJ SQ injection   metFORMIN  (GLUCOPHAGE ) 500 MG tablet   Aortic atherosclerosis - Primary   Relevant Medications   semaglutide -weight management (WEGOVY ) 1.7 MG/0.75ML SOAJ SQ injection   atorvastatin  (LIPITOR) 20 MG tablet   Other Visit Diagnoses       Mixed hyperlipidemia       Relevant Medications   semaglutide -weight management (WEGOVY ) 1.7 MG/0.75ML SOAJ SQ injection   atorvastatin  (LIPITOR) 20 MG tablet     Prediabetes       Relevant Medications   semaglutide -weight management (WEGOVY ) 1.7 MG/0.75ML SOAJ SQ injection   metFORMIN  (GLUCOPHAGE ) 500 MG tablet     BMI 30.0-30.9,adult Current BMI 30.7          Obesity with emotional eating and food cravings Currently on Wegovy  1 mg per week, following a category two plan 50% of the time. Weight loss of 3 pounds since last visit. Reports skipping meals and insufficient protein and water intake, raising concerns about muscle mass loss. - Continue Wegovy  1 mg per week - Encourage consistent protein intake, including Premier protein shakes - Advise against skipping meals - Discuss alternatives to candy, such as protein bars with low sugar alcohols -  Reinforce importance of reading labels for sugar content - Encourage consumption of fresh foods over processed foods  Prediabetes Recent hemoglobin A1c of 5.5 and elevated insulin  level at 17.4. Discussed impact of sugar intake on insulin  levels and fat storage. - Encourage reduction of sugar intake - Advise on choosing lower sugar options for snacks and meals - Monitor hemoglobin A1c and insulin  levels  Mixed hyperlipidemia -  Refill atorvastatin  prescription  Depression - Continue Wellbutrin  as prescribed  Vitamin D  deficiency Importance of maintaining adequate vitamin D  levels discussed. - Refill vitamin D  prescription Vitals Temp: 98.2 F (36.8 C) BP: 131/70 Pulse Rate: 75 SpO2: 97 %   Anthropometric Measurements Height: 5' 3 (1.6 m) Weight: 173 lb (78.5 kg) BMI (Calculated): 30.65 Weight at Last Visit: 176 lb Weight Lost Since Last Visit: 3 lb Weight Gained Since Last Visit: 0 Starting Weight: 189 lb Total Weight Loss (lbs): 16 lb (7.258 kg) Peak Weight: 200 lb   Body Composition  Body Fat %: 38.8 % Fat Mass (lbs): 67.2 lbs Muscle Mass (lbs): 100.6 lbs Total Body Water (lbs): 68 lbs Visceral Fat Rating : 11   Other Clinical Data Fasting: No Labs: No Today's Visit #: 8 Starting Date: 06/25/23     ASSESSMENT AND PLAN:  Diet: [place] is currently in the action stage of change. As such, her goal is to continue with weight loss efforts. She has agreed to Category 2 Plan.  Exercise: Anquanette has been instructed to work up to a goal of 150 minutes of combined cardio and strengthening exercise per week for weight loss and overall health benefits.   Behavior Modification:  We discussed the following Behavioral Modification Strategies today: increasing lean protein intake, decreasing simple carbohydrates, increasing vegetables, increase H2O intake, increase high fiber foods, meal planning and cooking strategies, better snacking choices, avoiding temptations, and planning for success. We discussed various medication options to help Sincerity with her weight loss efforts and we both agreed to increase Wegovy  to 1.7 mg weekly for aortic atherosclerosis, hyperlipidemia, prediabetes and to promote medical weight loss.  Return in about 4 weeks (around 02/23/2024).[name] She was informed of the importance of frequent follow up visits to maximize her success with intensive lifestyle modifications  for her multiple health conditions.  Attestation Statements:   Reviewed by clinician on day of visit: allergies, medications, problem list, medical history, surgical history, family history, social history, and previous encounter notes.   Time spent on visit including pre-visit chart review and post-visit care and charting was *** minutes.    Jonne Rote, PA-C

## 2024-01-26 ENCOUNTER — Encounter (INDEPENDENT_AMBULATORY_CARE_PROVIDER_SITE_OTHER): Payer: Self-pay | Admitting: Physician Assistant

## 2024-01-26 ENCOUNTER — Ambulatory Visit (INDEPENDENT_AMBULATORY_CARE_PROVIDER_SITE_OTHER): Admitting: Physician Assistant

## 2024-01-26 VITALS — BP 131/70 | HR 75 | Temp 98.2°F | Ht 63.0 in | Wt 173.0 lb

## 2024-01-26 DIAGNOSIS — I700 Atherosclerosis of aorta: Secondary | ICD-10-CM | POA: Diagnosis not present

## 2024-01-26 DIAGNOSIS — E669 Obesity, unspecified: Secondary | ICD-10-CM

## 2024-01-26 DIAGNOSIS — E782 Mixed hyperlipidemia: Secondary | ICD-10-CM

## 2024-01-26 DIAGNOSIS — Z6830 Body mass index (BMI) 30.0-30.9, adult: Secondary | ICD-10-CM | POA: Diagnosis not present

## 2024-01-26 DIAGNOSIS — E559 Vitamin D deficiency, unspecified: Secondary | ICD-10-CM | POA: Diagnosis not present

## 2024-01-26 DIAGNOSIS — F3289 Other specified depressive episodes: Secondary | ICD-10-CM

## 2024-01-26 DIAGNOSIS — Z6833 Body mass index (BMI) 33.0-33.9, adult: Secondary | ICD-10-CM

## 2024-01-26 DIAGNOSIS — R7303 Prediabetes: Secondary | ICD-10-CM

## 2024-01-26 MED ORDER — METFORMIN HCL 500 MG PO TABS: 500.0000 mg | ORAL_TABLET | Freq: Every day | ORAL | 0 refills | Status: DC

## 2024-01-26 MED ORDER — VITAMIN D (ERGOCALCIFEROL) 1.25 MG (50000 UNIT) PO CAPS
50000.0000 [IU] | ORAL_CAPSULE | ORAL | 0 refills | Status: DC
Start: 2024-01-26 — End: 2024-02-23

## 2024-01-26 MED ORDER — ATORVASTATIN CALCIUM 20 MG PO TABS: 20.0000 mg | ORAL_TABLET | Freq: Every evening | ORAL | 3 refills | Status: AC

## 2024-01-26 MED ORDER — BUPROPION HCL ER (SR) 150 MG PO TB12: 150.0000 mg | ORAL_TABLET | Freq: Every day | ORAL | 0 refills | Status: DC

## 2024-01-26 MED ORDER — WEGOVY 1.7 MG/0.75ML ~~LOC~~ SOAJ: 1.7000 mg | SUBCUTANEOUS | 0 refills | Status: DC

## 2024-02-02 ENCOUNTER — Ambulatory Visit

## 2024-02-02 VITALS — BP 126/74 | HR 76 | Temp 98.0°F | Ht 63.0 in | Wt 178.0 lb

## 2024-02-02 DIAGNOSIS — J440 Chronic obstructive pulmonary disease with acute lower respiratory infection: Secondary | ICD-10-CM

## 2024-02-02 DIAGNOSIS — Z716 Tobacco abuse counseling: Secondary | ICD-10-CM

## 2024-02-02 DIAGNOSIS — E66811 Obesity, class 1: Secondary | ICD-10-CM

## 2024-02-02 DIAGNOSIS — Z6831 Body mass index (BMI) 31.0-31.9, adult: Secondary | ICD-10-CM

## 2024-02-02 DIAGNOSIS — F5101 Primary insomnia: Secondary | ICD-10-CM

## 2024-02-02 DIAGNOSIS — Z122 Encounter for screening for malignant neoplasm of respiratory organs: Secondary | ICD-10-CM

## 2024-02-02 DIAGNOSIS — G4733 Obstructive sleep apnea (adult) (pediatric): Secondary | ICD-10-CM

## 2024-02-02 DIAGNOSIS — J209 Acute bronchitis, unspecified: Secondary | ICD-10-CM

## 2024-02-02 DIAGNOSIS — F17210 Nicotine dependence, cigarettes, uncomplicated: Secondary | ICD-10-CM

## 2024-02-02 MED ORDER — VARENICLINE TARTRATE (STARTER) 0.5 MG X 11 & 1 MG X 42 PO TBPK: ORAL_TABLET | ORAL | 0 refills | Status: DC

## 2024-02-02 MED ORDER — VARENICLINE TARTRATE 1 MG PO TABS: 1.0000 mg | ORAL_TABLET | Freq: Two times a day (BID) | ORAL | 1 refills | Status: AC

## 2024-02-02 NOTE — Assessment & Plan Note (Addendum)
 Likely related to GAD.  Once sleep apnea is ruled out will need management of her GAD by PCP.
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
## 2024-02-02 NOTE — Patient Instructions (Addendum)
 Notification of test results are managed in the following manner: If there are any recommendations or changes to the plan of care discussed in office today, we will contact you and let you know what they are. If you do not hear from us , then your results are normal/expected and you can view them through your MyChart account, or a letter will be sent to you. Thank you again for trusting us  with your care [facility].  [id number].  Take nicotine  patch and Chantix  as prescribed. Start with 21 mg of nicotine  patch and then go to 14 mg and then 2 7 mg after 4 weeks each.

## 2024-02-03 ENCOUNTER — Other Ambulatory Visit (INDEPENDENT_AMBULATORY_CARE_PROVIDER_SITE_OTHER): Payer: Self-pay | Admitting: Physician Assistant

## 2024-02-03 DIAGNOSIS — I700 Atherosclerosis of aorta: Secondary | ICD-10-CM

## 2024-02-03 DIAGNOSIS — R7303 Prediabetes: Secondary | ICD-10-CM

## 2024-02-03 DIAGNOSIS — E669 Obesity, unspecified: Secondary | ICD-10-CM

## 2024-02-03 DIAGNOSIS — E782 Mixed hyperlipidemia: Secondary | ICD-10-CM

## 2024-02-13 ENCOUNTER — Encounter

## 2024-02-13 DIAGNOSIS — G4733 Obstructive sleep apnea (adult) (pediatric): Secondary | ICD-10-CM

## 2024-02-13 DIAGNOSIS — G4730 Sleep apnea, unspecified: Secondary | ICD-10-CM | POA: Diagnosis not present

## 2024-02-18 ENCOUNTER — Other Ambulatory Visit (INDEPENDENT_AMBULATORY_CARE_PROVIDER_SITE_OTHER): Payer: Self-pay | Admitting: Physician Assistant

## 2024-02-18 DIAGNOSIS — I700 Atherosclerosis of aorta: Secondary | ICD-10-CM

## 2024-02-18 DIAGNOSIS — R7303 Prediabetes: Secondary | ICD-10-CM

## 2024-02-18 DIAGNOSIS — E782 Mixed hyperlipidemia: Secondary | ICD-10-CM

## 2024-02-18 DIAGNOSIS — E669 Obesity, unspecified: Secondary | ICD-10-CM

## 2024-02-20 ENCOUNTER — Other Ambulatory Visit (INDEPENDENT_AMBULATORY_CARE_PROVIDER_SITE_OTHER): Payer: Self-pay | Admitting: Physician Assistant

## 2024-02-20 DIAGNOSIS — F3289 Other specified depressive episodes: Secondary | ICD-10-CM

## 2024-02-21 ENCOUNTER — Other Ambulatory Visit (INDEPENDENT_AMBULATORY_CARE_PROVIDER_SITE_OTHER): Payer: Self-pay | Admitting: Physician Assistant

## 2024-02-21 DIAGNOSIS — R7303 Prediabetes: Secondary | ICD-10-CM

## 2024-02-22 NOTE — Progress Notes (Unsigned)
 SUBJECTIVE: Discussed the use of AI scribe software for clinical note transcription with the patient, who gave verbal consent to proceed.  Chief Complaint: Obesity  Interim History: She is down 8 lbs since her last visit.  Down 24 lbs overall TBW loss of 12.7%  Elizabeth Best is here to discuss her progress with her obesity treatment plan. She is on the Category 2 Plan and states she is following her eating plan approximately 75 % of the time. She states she is exercising walking at work 120+ minutes 5 times per week.  Elizabeth Best is a 68 year old female with obesity, prediabetes, and coronary disease with hyperlipidemia who presents for follow-up of her obesity treatment plan.  She is taking Wegovy  1.7 mg weekly and has been alternating between Wegovy  and Ozempic  monthly. She is not sure where the original Ozempic  Rx originated from and has been taking Ozempic  1 month/alternating with Wegovy  for 1 month. We discussed she should be on Wegovy  for primary indication of CAD with hyperlipidemia and not on Ozempic  and have removed this from her medication list. Reinforced with the patient that these medications are the same and should only use the Wegovy  at this time.  She reports recent weight loss of 8 lbs  and now weighs 165 pounds. She reports she is eating more whole foods, but not always getting in enough protein.  She is not drinking enough water and does report only eating 2 meals.   She smokes six to eight cigarettes a day, sometimes up to half a pack on a bad day. She has been prescribed Chantix  for smoking cessation but has not started it due to insurance not covering the patches. She is not ready to quit smoking yet. She did see pulmonary- Dr. Theodoro and has done a home sleep study.  She experiences insomnia, sleeping only two hours a night, and is awaiting results from a recent sleep study. She takes gabapentin  for foot pain, which has improved with B12 supplementation. Her last B12  level was 370, and she continues to take B 12 1000 mcg daily.  She plans to start jazzercise.  Fasting labs obtained today.  The patient was informed we would discuss the lab results at the next visit unless there is a critical issue that needs to be addressed sooner. The patient agreed to keep the next visit at the agreed upon time to discuss these results.   OBJECTIVE: Visit Diagnoses: Problem List Items Addressed This Visit     Insomnia   Relevant Orders   Vitamin B12   Vitamin D  deficiency   Relevant Medications   Vitamin D , Ergocalciferol , (DRISDOL ) 1.25 MG (50000 UNIT) CAPS capsule   Other Relevant Orders   VITAMIN D  25 Hydroxy (Vit-D Deficiency, Fractures)   Depression   Relevant Medications   buPROPion  (WELLBUTRIN  SR) 150 MG 12 hr tablet   Generalized obesity   Relevant Medications   semaglutide -weight management (WEGOVY ) 1.7 MG/0.75ML SOAJ SQ injection   metFORMIN  (GLUCOPHAGE ) 500 MG tablet   Aortic atherosclerosis - Primary   Relevant Medications   semaglutide -weight management (WEGOVY ) 1.7 MG/0.75ML SOAJ SQ injection   Other Relevant Orders   Lipid Panel With LDL/HDL Ratio   B12 nutritional deficiency   Other Visit Diagnoses       Mixed hyperlipidemia       Relevant Medications   semaglutide -weight management (WEGOVY ) 1.7 MG/0.75ML SOAJ SQ injection   Other Relevant Orders   Lipid Panel With LDL/HDL Ratio     Prediabetes  Relevant Medications   semaglutide -weight management (WEGOVY ) 1.7 MG/0.75ML SOAJ SQ injection   metFORMIN  (GLUCOPHAGE ) 500 MG tablet   Other Relevant Orders   CMP14+EGFR   Hemoglobin A1c   Insulin , random     BMI 29.0-29.9,adult Current BMI 29.3         Obesity  Weight has decreased to 165 lbs, BMI under 30, and body adipose percent at 38.2%. Emotional eating related to stress and relationship issues. Wegovy  aids in reducing emotional eating and supports weight loss but primary indication of aortic atherosclerosis and  hyperlipidemia . Discussed potential risks of taking Wegovy  and Ozempic  together, including gastrointestinal issues. - Continue Wegovy  for primary indication of CV risks in addition to weight loss and emotional eating management. - Encouraged participation in [place] for physical activity and stress management. - Advised against taking Ozempic  and Wegovy  together due to potential risks.  Atherosclerosis of aorta and mixed hyperlipidemia Atherosclerosis of the aorta and mixed hyperlipidemia, managed with atorvastatin  and Wegovy . Wegovy  is used for known vascular disease of aorta and lipid management. Lipid levels to be rechecked today. Last lipids Lab Results  Component Value Date   CHOL 229 (H) 08/05/2022   HDL 33.90 (L) 08/05/2022   LDLCALC 157 (H) 08/05/2022   TRIG 189.0 (H) 08/05/2022   CHOLHDL 7 08/05/2022   The 10-year ASCVD risk score (Arnett DK, et al., 2019) is: 20.6%   Values used to calculate the score:     Age: 29 years     Clincally relevant sex: Female     Is Non-Hispanic African American: Yes     Diabetic: No     Tobacco smoker: Yes     Systolic Blood Pressure: 133 mmHg     Is BP treated: No     HDL Cholesterol: 33.9 mg/dL     Total Cholesterol: 229 mg/dL Remains at very high risk for CV disease and complications.  Plan:  Continue to work on nutrition plan -decreasing simple carbohydrates, increasing lean proteins, decreasing saturated fats and cholesterol , avoiding trans fats and exercise as able to promote weight loss, improve lipids and decrease cardiovascular risks. - Continue atorvastatin  for hyperlipidemia. - Continue Wegovy  for atherosclerosis and lipid management. - Ordered lipid panel to assess current levels.  Prediabetes Managed with metformin . Metformin  is beneficial in conjunction with Wegovy  for weight management and prediabetes control. Lab Results  Component Value Date   HGBA1C 5.5 06/25/2023   HGBA1C 5.4 01/03/2023   HGBA1C 5.7 (A) 09/26/2021    Lab Results  Component Value Date   LDLCALC 157 (H) 08/05/2022   CREATININE 0.79 09/20/2023   INSULIN   Date Value Ref Range Status  06/25/2023 17.4 2.6 - 24.9 uIU/mL Final  ]Continue working on nutrition plan to decrease simple carbohydrates, increase lean proteins and exercise to promote weight loss, improve glycemic control and prevent progression to Type 2 diabetes.  ] - Continue/refill metformin  500 mg daily for prediabetes management. Recheck insulin , A1c and CMET today.   Vitamin D  deficiency Vitamin D  deficiency, currently managed with supplementation.-Ergocalciferol  50,000 units once weekly. No N/V or muscle weakness with Ergocalciferol .  Last vitamin D  Lab Results  Component Value Date   VD25OH 24.0 (L) 06/25/2023   Low vitamin D  levels can be associated with adiposity and may result in leptin resistance and weight gain. Also associated with fatigue.  Currently on vitamin D  supplementation without any adverse effects such as nausea, vomiting or muscle weakness.  - Ordered vitamin D  level to assess current status.  Low  serum B 12 level Lab Results  Component Value Date   VITAMINB12 370 09/17/2023   She has been on B 12 1000 mcg daily and reports numbness and pain in her feet has improved. Energy level has improved.  Plan: Recheck B 12 level with other labs today.   Tobacco use disorder Tobacco use disorder, currently smoking 3-4 packs per week. Chantix  prescribed but not used due to lack of insurance coverage for patches. Smoking cessation is a future goal. Discussed financial benefits of quitting smoking, including potential savings of over $600+ per year. - Encouraged smoking cessation as a future goal. - Discussed financial benefits of quitting smoking, including potential savings of over $600 per year.  Other depression/emotional eating Elizabeth Best has had issues with stress/emotional eating. Currently this is moderately controlled. Overall mood is  stable. Medication(s): Bupropion  SR 150 mg daily in am  Plan: Continue and refill Bupropion  SR 150 mg daily in am Continue to work on emotional eating strategies.   Insomnia Reports sleeping only few hours nightly.  Sleep work up for apnea underway and results pending.  Discussed follow up with PCP for management if no evidence for OSA.   Vitals Temp: 98.1 F (36.7 C) BP: 133/74 Pulse Rate: 75 SpO2: 100 %   Anthropometric Measurements Height: 5' 3 (1.6 m) Weight: 165 lb (74.8 kg) BMI (Calculated): 29.24 Weight at Last Visit: 173 lb Weight Lost Since Last Visit: 8 lb Weight Gained Since Last Visit: 0 Starting Weight: 189 lb Total Weight Loss (lbs): 24 lb (10.9 kg) Peak Weight: 200 lb   Body Composition  Body Fat %: 38.2 % Fat Mass (lbs): 63.2 lbs Muscle Mass (lbs): 97.2 lbs Total Body Water (lbs): 65.5 lbs Visceral Fat Rating : 10   Other Clinical Data Fasting: No Labs: Yes Today's Visit #: 9 Starting Date: 06/25/23     ASSESSMENT AND PLAN:  Diet: Elizabeth Best is currently in the action stage of change. As such, her goal is to continue with weight loss efforts. She has agreed to Category 2 Plan.  Exercise: Elizabeth Best has been instructed to work up to a goal of 150 minutes of combined cardio and strengthening exercise per week for weight loss and overall health benefits.   Behavior Modification:  We discussed the following Behavioral Modification Strategies today: increasing lean protein intake, decreasing simple carbohydrates, increasing vegetables, increase H2O intake, increase high fiber foods, no skipping meals, meal planning and cooking strategies, emotional eating strategies , avoiding temptations, and planning for success. We discussed various medication options to help Elizabeth Best with her weight loss efforts and we both agreed to continue current treatment plan with Wegovy  for primary indication of Aortic atherosclerosis/hyperlipidemia.  Return in about  4 weeks (around 03/22/2024).Elizabeth Best She was informed of the importance of frequent follow up visits to maximize her success with intensive lifestyle modifications for her multiple health conditions.  Attestation Statements:   Reviewed by clinician on day of visit: allergies, medications, problem list, medical history, surgical history, family history, social history, and previous encounter notes.   Time spent on visit including pre-visit chart review and post-visit care and charting was 47 minutes.    Elizabeth Rana, PA-C

## 2024-02-23 ENCOUNTER — Ambulatory Visit (INDEPENDENT_AMBULATORY_CARE_PROVIDER_SITE_OTHER): Admitting: Physician Assistant

## 2024-02-23 ENCOUNTER — Encounter (INDEPENDENT_AMBULATORY_CARE_PROVIDER_SITE_OTHER): Payer: Self-pay | Admitting: Physician Assistant

## 2024-02-23 VITALS — BP 133/74 | HR 75 | Temp 98.1°F | Ht 63.0 in | Wt 165.0 lb

## 2024-02-23 DIAGNOSIS — F5101 Primary insomnia: Secondary | ICD-10-CM | POA: Diagnosis not present

## 2024-02-23 DIAGNOSIS — E559 Vitamin D deficiency, unspecified: Secondary | ICD-10-CM

## 2024-02-23 DIAGNOSIS — G4700 Insomnia, unspecified: Secondary | ICD-10-CM | POA: Diagnosis not present

## 2024-02-23 DIAGNOSIS — E538 Deficiency of other specified B group vitamins: Secondary | ICD-10-CM | POA: Diagnosis not present

## 2024-02-23 DIAGNOSIS — F3289 Other specified depressive episodes: Secondary | ICD-10-CM | POA: Diagnosis not present

## 2024-02-23 DIAGNOSIS — Z6829 Body mass index (BMI) 29.0-29.9, adult: Secondary | ICD-10-CM

## 2024-02-23 DIAGNOSIS — E669 Obesity, unspecified: Secondary | ICD-10-CM

## 2024-02-23 DIAGNOSIS — F17210 Nicotine dependence, cigarettes, uncomplicated: Secondary | ICD-10-CM

## 2024-02-23 DIAGNOSIS — F5089 Other specified eating disorder: Secondary | ICD-10-CM

## 2024-02-23 DIAGNOSIS — I700 Atherosclerosis of aorta: Secondary | ICD-10-CM

## 2024-02-23 DIAGNOSIS — R7303 Prediabetes: Secondary | ICD-10-CM

## 2024-02-23 DIAGNOSIS — E782 Mixed hyperlipidemia: Secondary | ICD-10-CM | POA: Diagnosis not present

## 2024-02-23 DIAGNOSIS — F17209 Nicotine dependence, unspecified, with unspecified nicotine-induced disorders: Secondary | ICD-10-CM

## 2024-02-23 MED ORDER — BUPROPION HCL ER (SR) 150 MG PO TB12
150.0000 mg | ORAL_TABLET | Freq: Every day | ORAL | 0 refills | Status: DC
Start: 2024-02-23 — End: 2024-03-11

## 2024-02-23 MED ORDER — METFORMIN HCL 500 MG PO TABS: 500.0000 mg | ORAL_TABLET | Freq: Every day | ORAL | 0 refills | Status: DC

## 2024-02-23 MED ORDER — WEGOVY 1.7 MG/0.75ML ~~LOC~~ SOAJ: 1.7000 mg | SUBCUTANEOUS | 0 refills | Status: DC

## 2024-02-23 MED ORDER — VITAMIN D (ERGOCALCIFEROL) 1.25 MG (50000 UNIT) PO CAPS: 50000.0000 [IU] | ORAL_CAPSULE | ORAL | 0 refills | Status: DC

## 2024-02-25 LAB — CMP14+EGFR
ALT: 13 IU/L (ref 0–32)
AST: 13 IU/L (ref 0–40)
Albumin: 4.8 g/dL (ref 3.9–4.9)
Alkaline Phosphatase: 96 IU/L (ref 49–135)
BUN/Creatinine Ratio: 15 (ref 12–28)
BUN: 11 mg/dL (ref 8–27)
Bilirubin Total: 0.2 mg/dL (ref 0.0–1.2)
CO2: 21 mmol/L (ref 20–29)
Calcium: 10.7 mg/dL — ABNORMAL HIGH (ref 8.7–10.3)
Chloride: 103 mmol/L (ref 96–106)
Creatinine, Ser: 0.73 mg/dL (ref 0.57–1.00)
Globulin, Total: 2.9 g/dL (ref 1.5–4.5)
Glucose: 74 mg/dL (ref 70–99)
Potassium: 4.3 mmol/L (ref 3.5–5.2)
Sodium: 140 mmol/L (ref 134–144)
Total Protein: 7.7 g/dL (ref 6.0–8.5)
eGFR: 91 mL/min/1.73 (ref >59)

## 2024-02-25 LAB — LIPID PANEL WITH LDL/HDL RATIO
Cholesterol, Total: 173 mg/dL (ref 100–199)
HDL: 45 mg/dL (ref >39)
LDL Chol Calc (NIH): 112 mg/dL — ABNORMAL HIGH (ref 0–99)
LDL/HDL Ratio: 2.5 ratio (ref 0.0–3.2)
Triglycerides: 85 mg/dL (ref 0–149)
VLDL Cholesterol Cal: 16 mg/dL (ref 5–40)

## 2024-02-25 LAB — HEMOGLOBIN A1C
Est. average glucose Bld gHb Est-mCnc: 97 mg/dL
Hgb A1c MFr Bld: 5 % (ref 4.8–5.6)

## 2024-02-25 LAB — INSULIN, RANDOM: INSULIN: 12.4 u[IU]/mL (ref 2.6–24.9)

## 2024-02-25 LAB — VITAMIN B12: Vitamin B-12: 981 pg/mL (ref 232–1245)

## 2024-02-25 LAB — VITAMIN D 25 HYDROXY (VIT D DEFICIENCY, FRACTURES): Vit D, 25-Hydroxy: 53.2 ng/mL (ref 30.0–100.0)

## 2024-03-02 DIAGNOSIS — G4733 Obstructive sleep apnea (adult) (pediatric): Secondary | ICD-10-CM | POA: Diagnosis not present

## 2024-03-02 DIAGNOSIS — R069 Unspecified abnormalities of breathing: Secondary | ICD-10-CM | POA: Diagnosis not present

## 2024-03-04 ENCOUNTER — Telehealth: Payer: Self-pay

## 2024-03-04 DIAGNOSIS — G4733 Obstructive sleep apnea (adult) (pediatric): Secondary | ICD-10-CM

## 2024-03-04 NOTE — Telephone Encounter (Signed)
 Date of Study: 02/13/24  Interpretation: Mild OSA with AHI of 11.4, O2 desaturation <1 min. O2 nadir 84%.   Plan: Initiate auto CPAP at 6-15 cm H2O and provide supplies.   Sammi Fredericks, MD.

## 2024-03-05 NOTE — Telephone Encounter (Signed)
 ATC x1. LVMTCB

## 2024-03-08 NOTE — Addendum Note (Signed)
 Addended byBETHA JESSICA BOUCHARD S on: 03/08/2024 10:10 AM   Modules accepted: Orders

## 2024-03-08 NOTE — Telephone Encounter (Signed)
 Called and spoke to pt - advised of sleep study results per Dr. Theodoro. Pt verbalized understanding, CPAP order placed. NFN.

## 2024-03-09 ENCOUNTER — Telehealth: Payer: Self-pay

## 2024-03-09 NOTE — Progress Notes (Signed)
   03/09/2024  Patient ID: Elizabeth Best, female   DOB: February 26, 1956, 68 y.o.   MRN: [id number]  This patient is appearing on a report for being at risk of failing the adherence measure for diabetes medications this calendar year.   Medication: metformin  500mg  daily  Last fill date: 02/23/24 for 30 day supply  Insurance report was not up to date. No action needed at this time.   Channing DELENA Mealing, PharmD, DPLA

## 2024-03-09 NOTE — Progress Notes (Addendum)
   03/09/2024  Patient ID: Elizabeth Best, female   DOB: 10-06-1955, 68 y.o.   MRN: [id number]  This patient is appearing on a report for being at risk of failing the adherence measure for cholesterol (statin) medications this calendar year.   Medication: atorvastatin  20mg  daily  Last fill date: 02/17/24 for 90 day supply  Insurance report was not up to date. No action needed at this time.   Channing DELENA Mealing, PharmD, DPLA

## 2024-03-10 ENCOUNTER — Encounter (INDEPENDENT_AMBULATORY_CARE_PROVIDER_SITE_OTHER): Payer: Self-pay | Admitting: Physician Assistant

## 2024-03-11 ENCOUNTER — Ambulatory Visit (INDEPENDENT_AMBULATORY_CARE_PROVIDER_SITE_OTHER): Admitting: Family Medicine

## 2024-03-11 VITALS — BP 138/80 | HR 81 | Temp 97.8°F | Ht 63.0 in | Wt 163.0 lb

## 2024-03-11 DIAGNOSIS — E782 Mixed hyperlipidemia: Secondary | ICD-10-CM | POA: Diagnosis not present

## 2024-03-11 DIAGNOSIS — I700 Atherosclerosis of aorta: Secondary | ICD-10-CM

## 2024-03-11 DIAGNOSIS — F5089 Other specified eating disorder: Secondary | ICD-10-CM | POA: Diagnosis not present

## 2024-03-11 DIAGNOSIS — Z6828 Body mass index (BMI) 28.0-28.9, adult: Secondary | ICD-10-CM

## 2024-03-11 DIAGNOSIS — R7303 Prediabetes: Secondary | ICD-10-CM | POA: Diagnosis not present

## 2024-03-11 DIAGNOSIS — F3289 Other specified depressive episodes: Secondary | ICD-10-CM

## 2024-03-11 DIAGNOSIS — E669 Obesity, unspecified: Secondary | ICD-10-CM | POA: Diagnosis not present

## 2024-03-11 MED ORDER — BUPROPION HCL ER (SR) 150 MG PO TB12: 150.0000 mg | ORAL_TABLET | Freq: Every day | ORAL | 0 refills | Status: DC

## 2024-03-11 MED ORDER — METFORMIN HCL 500 MG PO TABS: 500.0000 mg | ORAL_TABLET | Freq: Every day | ORAL | 0 refills | Status: DC

## 2024-03-11 MED ORDER — WEGOVY 2.4 MG/0.75ML ~~LOC~~ SOAJ: 2.4000 mg | SUBCUTANEOUS | 0 refills | Status: DC

## 2024-03-11 NOTE — Progress Notes (Signed)
 SUBJECTIVE:  Chief Complaint: Obesity  Interim History: patient here for routine follow up.  She is doing well on Wegovy  1.7mg  weekly but feels it is time to go up.  Overall she is eating on Category 2- she isn't consistent with her food intake.  She normally doesn't eat in the am but has been intermittently having an egg or protein shake.  She eats what and when she can.  She hasn't had much of an appetite.  She is resting for Thanksgiving- working day before and day after.  Elizabeth Best is here to discuss her progress with her obesity treatment plan. She is on the Category 2 Plan and states she is following her eating plan approximately 80 % of the time. She states she is walking at work 8 hours 5 times per week.   OBJECTIVE: Visit Diagnoses: Problem List Items Addressed This Visit       Cardiovascular and Mediastinum   Aortic atherosclerosis   Relevant Medications   semaglutide -weight management (WEGOVY ) 2.4 MG/0.75ML SOAJ SQ injection     Other   Depression   Generalized obesity   Relevant Medications   semaglutide -weight management (WEGOVY ) 2.4 MG/0.75ML SOAJ SQ injection   Other Visit Diagnoses       Mixed hyperlipidemia       Relevant Medications   semaglutide -weight management (WEGOVY ) 2.4 MG/0.75ML SOAJ SQ injection     Prediabetes       Relevant Medications   semaglutide -weight management (WEGOVY ) 2.4 MG/0.75ML SOAJ SQ injection       Vitals Temp: 97.8 F (36.6 C) BP: 138/80 Pulse Rate: 81 SpO2: 99 %   Anthropometric Measurements Height: 5' 3 (1.6 m) Weight: 163 lb (73.9 kg) BMI (Calculated): 28.88 Weight at Last Visit: 165 lb Weight Lost Since Last Visit: 2 lb Starting Weight: 189 lb Total Weight Loss (lbs): 26 lb (11.8 kg)   Body Composition  Body Fat %: 37 % Fat Mass (lbs): 60.4 lbs Muscle Mass (lbs): 97.6 lbs Total Body Water (lbs): 64.8 lbs Visceral Fat Rating : 10   Other Clinical Data Fasting: yes Labs: no Today's Visit #:  10 Starting Date: 06/25/23 Comments: Cat 2     ASSESSMENT AND PLAN: Assessment & Plan Mixed hyperlipidemia On Wegovy  for cardiac protection as well as dietary intake control.  Increase Wegovy  to 1.7 mg weekly. Prediabetes Tolerating metformin  and needs refill today.  No change in prescription dosage due to concurrent therapy with Wegovy . Aortic atherosclerosis Given vascular disease of aorta patient is on Wegovy  for decreasing further risk.  Increase Wegovy  to 1.7 mg weekly. Emotional Eating Behavior On Wellbutrin  with no side effects.  Needs refill today.  Continue current dosage BMI 28.0-28.9,adult  Generalized obesity- start BMI 33.58    Diet: Elizabeth Best is currently in the action stage of change. As such, her goal is to continue with weight loss efforts and has agreed to keeping a food journal and adhering to recommended goals of 1100-1200 calories and 85 or more grams of protein daily.  Patient to start food log or journaling meal plan.  The initial goal will be to habitually log or journal for at least 4 days a week.  The expectation it that patient may not initially meet calorie or protein goals as the nutritional understanding of food intake is begun.  We discussed the 10:1 ratio when reading a food label.  Patient agrees to keep a food log either electronically or on paper and bring to the next appointment to be able to  dissect and discuss it with provider.    Exercise:  Older adults should determine their level of effort for physical activity relative to their level of fitness.  Behavior Modification:  We discussed the following Behavioral Modification Strategies today: increasing lean protein intake, decreasing simple carbohydrates, increasing vegetables, meal planning and cooking strategies, keeping healthy foods in the home, planning for success, and keep a strict food journal. We discussed various medication options to help Elizabeth Best with her weight loss efforts and we  both agreed to increase wegovy  to 2.4mg  weekly.  Return in about 4 weeks (around 04/08/2024).   She was informed of the importance of frequent follow up visits to maximize her success with intensive lifestyle modifications for her multiple health conditions.  Attestation Statements:   Reviewed by clinician on day of visit: allergies, medications, problem list, medical history, surgical history, family history, social history, and previous encounter notes.     Adelita Cho, MD

## 2024-03-12 ENCOUNTER — Encounter: Payer: Self-pay | Admitting: Nurse Practitioner

## 2024-03-12 ENCOUNTER — Ambulatory Visit: Payer: Self-pay | Admitting: Nurse Practitioner

## 2024-03-12 ENCOUNTER — Ambulatory Visit (INDEPENDENT_AMBULATORY_CARE_PROVIDER_SITE_OTHER): Admitting: Nurse Practitioner

## 2024-03-12 VITALS — BP 130/76 | HR 78 | Temp 97.8°F | Ht 63.0 in | Wt 167.2 lb

## 2024-03-12 DIAGNOSIS — F330 Major depressive disorder, recurrent, mild: Secondary | ICD-10-CM

## 2024-03-12 DIAGNOSIS — F17209 Nicotine dependence, unspecified, with unspecified nicotine-induced disorders: Secondary | ICD-10-CM | POA: Diagnosis not present

## 2024-03-12 DIAGNOSIS — R519 Headache, unspecified: Secondary | ICD-10-CM

## 2024-03-12 DIAGNOSIS — Z122 Encounter for screening for malignant neoplasm of respiratory organs: Secondary | ICD-10-CM

## 2024-03-12 DIAGNOSIS — I10 Essential (primary) hypertension: Secondary | ICD-10-CM | POA: Diagnosis not present

## 2024-03-12 LAB — CBC WITH DIFFERENTIAL/PLATELET
Basophils Absolute: 0.1 K/uL (ref 0.0–0.1)
Basophils Relative: 0.8 % (ref 0.0–3.0)
Eosinophils Absolute: 0.2 K/uL (ref 0.0–0.7)
Eosinophils Relative: 2.1 % (ref 0.0–5.0)
HCT: 43.6 % (ref 36.0–46.0)
Hemoglobin: 13.9 g/dL (ref 12.0–15.0)
Lymphocytes Relative: 21 % (ref 12.0–46.0)
Lymphs Abs: 1.6 K/uL (ref 0.7–4.0)
MCHC: 31.9 g/dL (ref 30.0–36.0)
MCV: 78.5 fl (ref 78.0–100.0)
Monocytes Absolute: 0.5 K/uL (ref 0.1–1.0)
Monocytes Relative: 6 % (ref 3.0–12.0)
Neutro Abs: 5.4 K/uL (ref 1.4–7.7)
Neutrophils Relative %: 70.1 % (ref 43.0–77.0)
Platelets: 400 K/uL (ref 150.0–400.0)
RBC: 5.55 Mil/uL — ABNORMAL HIGH (ref 3.87–5.11)
RDW: 15.2 % (ref 11.5–15.5)
WBC: 7.6 K/uL (ref 4.0–10.5)

## 2024-03-12 LAB — IBC + FERRITIN
Ferritin: 70.1 ng/mL (ref 10.0–291.0)
Iron: 50 ug/dL (ref 42–145)
Saturation Ratios: 15.1 % — ABNORMAL LOW (ref 20.0–50.0)
TIBC: 330.4 ug/dL (ref 250.0–450.0)
Transferrin: 236 mg/dL (ref 212.0–360.0)

## 2024-03-12 LAB — C-REACTIVE PROTEIN: CRP: 0.6 mg/dL (ref 0.5–20.0)

## 2024-03-12 LAB — SEDIMENTATION RATE: Sed Rate: 28 mm/h (ref 0–30)

## 2024-03-12 NOTE — Assessment & Plan Note (Signed)
 BP at goal BP Readings from Last 3 Encounters:  03/12/24 130/76  03/11/24 138/80  02/23/24 133/74

## 2024-03-12 NOTE — Assessment & Plan Note (Addendum)
 Stable mood with wellbutrin  150mg  daily

## 2024-03-12 NOTE — Assessment & Plan Note (Addendum)
 She agreed to CT chest. Did not start chantix  as prescribed. She insist on using chantix  and nicoderm patch. I advised about that combination. She is already taking wellbutrin  150mg  daily Advised to use chantix  only.

## 2024-03-12 NOTE — Progress Notes (Signed)
 Established Patient Visit  Patient: Elizabeth Best   DOB: [birth year]   68 y.o. Female  MRN: [id number] Visit Date: 03/12/2024  Subjective:    Chief Complaint  Patient presents with   Follow-up    FASTING    HPI Essential hypertension BP at goal BP Readings from Last 3 Encounters:  03/12/24 130/76  03/11/24 138/80  02/23/24 133/74    Tobacco use disorder, continuous She agreed to CT chest. Did not start chantix  as prescribed. She insist on using chantix  and nicoderm patch. I advised about that combination. She is already taking wellbutrin  150mg  daily Advised to use chantix  only.  Depression Stable mood with wellbutrin  150mg  daily  Today she reports Scalp sensitivity at night (occipital and parietal regions), chronic but worsening in last 6months, no pain during the day. No pain with combing her hair. No rash. No erythema, no headache, no change in vision     03/12/2024    8:53 AM 08/13/2023   11:05 AM 08/13/2023   11:04 AM  Depression screen PHQ 2/9  Decreased Interest 0 1 1  Down, Depressed, Hopeless 0 0 0  PHQ - 2 Score 0 1 1  Altered sleeping 3 3   Tired, decreased energy 3 2   Change in appetite 0 1   Feeling bad or failure about yourself  0 0   Trouble concentrating 0 0   Moving slowly or fidgety/restless 0 0   Suicidal thoughts 0 0   PHQ-9 Score 6 7    Difficult doing work/chores Somewhat difficult Not difficult at all      Data saved with a previous flowsheet row definition       03/12/2024    8:54 AM 08/13/2023   11:06 AM 05/30/2023   11:19 AM 01/17/2023   10:55 AM  GAD 7 : Generalized Anxiety Score  Nervous, Anxious, on Edge 0 1 1 0  Control/stop worrying 3 1 3 3   Worry too much - different things 3 1 3 3   Trouble relaxing 0 1 1 1   Restless 0 0 0 0  Easily annoyed or irritable 0 0 0 0  Afraid - awful might happen 0 1 3 3   Total GAD 7 Score 6 5 11 10   Anxiety Difficulty Somewhat difficult Not difficult at all Not difficult at all  Not difficult at all     Reviewed medical, surgical, and social history today  Medications: Outpatient Medications Prior to Visit  Medication Sig   atorvastatin  (LIPITOR) 20 MG tablet Take 1 tablet (20 mg total) by mouth every evening.   buPROPion  (WELLBUTRIN  SR) 150 MG 12 hr tablet Take 1 tablet (150 mg total) by mouth daily.   ibuprofen  (ADVIL ) 800 MG tablet Take 1 tablet (800 mg total) by mouth every 8 (eight) hours as needed for moderate pain (pain score 4-6).   metFORMIN  (GLUCOPHAGE ) 500 MG tablet Take 1 tablet (500 mg total) by mouth daily.   semaglutide -weight management (WEGOVY ) 2.4 MG/0.75ML SOAJ SQ injection Inject 2.4 mg into the skin once a week.   varenicline  (CHANTIX ) 1 MG tablet Take 1 tablet (1 mg total) by mouth 2 (two) times daily.   Vitamin D , Ergocalciferol , (DRISDOL ) 1.25 MG (50000 UNIT) CAPS capsule Take 1 capsule (50,000 Units total) by mouth every 7 (seven) days.   [DISCONTINUED] methocarbamol  (ROBAXIN ) 500 MG tablet Take 1 tablet (500 mg total) by mouth 3 (three) times daily as needed for  muscle spasms. (Patient not taking: Reported on 03/12/2024)   [DISCONTINUED] Varenicline  Tartrate, Starter, 0.5 MG X 11 & 1 MG X 42 TBPK Take 0.5mg  tablet by mouth once daily on days 1 to 3. Then take 0.5mg  tablet twice daily on days 4 to 7. On day 8 and thereafter, take 1mg  tablet by mouth twice daily. (Patient not taking: Reported on 03/12/2024)   No facility-administered medications prior to visit.   Reviewed past medical and social history.   ROS per HPI above      Objective:  BP 130/76 (BP Location: Right Arm, Patient Position: Sitting, Cuff Size: Large)   Pulse 78   Temp 97.8 F (36.6 C) (Oral)   Ht 5' 3 (1.6 m)   Wt 167 lb 3.2 oz (75.8 kg)   SpO2 96%   BMI 29.62 kg/m      Physical Exam Vitals and nursing note reviewed.  Cardiovascular:     Rate and Rhythm: Normal rate and regular rhythm.     Pulses: Normal pulses.     Heart sounds: Normal heart sounds.   Pulmonary:     Effort: Pulmonary effort is normal.     Breath sounds: Normal breath sounds.  Skin:    General: Skin is warm and dry.     Findings: No erythema or rash.  Neurological:     Mental Status: She is alert and oriented to person, place, and time.  Psychiatric:        Mood and Affect: Mood normal.        Behavior: Behavior normal.        Thought Content: Thought content normal.     Results for orders placed or performed in visit on 03/12/24  C-reactive protein  Result Value Ref Range   CRP 0.6 0.5 - 20.0 mg/dL  Sedimentation rate  Result Value Ref Range   Sed Rate 28 0 - 30 mm/hr  CBC with Differential/Platelet  Result Value Ref Range   WBC 7.6 4.0 - 10.5 K/uL   RBC 5.55 (H) 3.87 - 5.11 Mil/uL   Hemoglobin 13.9 12.0 - 15.0 g/dL   HCT 56.3 63.9 - 53.9 %   MCV 78.5 78.0 - 100.0 fl   MCHC 31.9 30.0 - 36.0 g/dL   RDW 84.7 88.4 - 84.4 %   Platelets 400.0 150.0 - 400.0 K/uL   Neutrophils Relative % 70.1 43.0 - 77.0 %   Lymphocytes Relative 21.0 12.0 - 46.0 %   Monocytes Relative 6.0 3.0 - 12.0 %   Eosinophils Relative 2.1 0.0 - 5.0 %   Basophils Relative 0.8 0.0 - 3.0 %   Neutro Abs 5.4 1.4 - 7.7 K/uL   Lymphs Abs 1.6 0.7 - 4.0 K/uL   Monocytes Absolute 0.5 0.1 - 1.0 K/uL   Eosinophils Absolute 0.2 0.0 - 0.7 K/uL   Basophils Absolute 0.1 0.0 - 0.1 K/uL  IBC + Ferritin  Result Value Ref Range   Iron  50 42 - 145 ug/dL   Transferrin 763.9 787.9 - 360.0 mg/dL   Saturation Ratios 84.8 (L) 20.0 - 50.0 %   Ferritin 70.1 10.0 - 291.0 ng/mL   TIBC 330.4 250.0 - 450.0 mcg/dL      Assessment & Plan:    Problem List Items Addressed This Visit     Depression - Primary   Stable mood with wellbutrin  150mg  daily      Essential hypertension   BP at goal BP Readings from Last 3 Encounters:  03/12/24 130/76  03/11/24 138/80  02/23/24 133/74        Tobacco use disorder, continuous   She agreed to CT chest. Did not start chantix  as prescribed. She insist on using  chantix  and nicoderm patch. I advised about that combination. She is already taking wellbutrin  150mg  daily Advised to use chantix  only.      Relevant Orders   CT CHEST LUNG CANCER SCREENING LOW DOSE WO CONTRAST   Other Visit Diagnoses       Encounter for screening for lung cancer       Relevant Orders   CT CHEST LUNG CANCER SCREENING LOW DOSE WO CONTRAST     Pain of scalp       Relevant Orders   C-reactive protein (Completed)   Sedimentation rate (Completed)   Antinuclear Antib (ANA)   CBC with Differential/Platelet (Completed)   IBC + Ferritin (Completed)      Return in about 6 months (around 09/09/2024) for HTN, hyperlipidemia (fasting).     Roselie Mood, NP

## 2024-03-12 NOTE — Patient Instructions (Signed)
 Go to lab

## 2024-03-13 LAB — ANA: Anti Nuclear Antibody (ANA): NEGATIVE

## 2024-03-15 ENCOUNTER — Ambulatory Visit (INDEPENDENT_AMBULATORY_CARE_PROVIDER_SITE_OTHER): Admitting: Dermatology

## 2024-03-15 ENCOUNTER — Encounter: Payer: Self-pay | Admitting: Dermatology

## 2024-03-15 VITALS — BP 116/76

## 2024-03-15 DIAGNOSIS — L819 Disorder of pigmentation, unspecified: Secondary | ICD-10-CM

## 2024-03-15 DIAGNOSIS — L810 Postinflammatory hyperpigmentation: Secondary | ICD-10-CM

## 2024-03-15 DIAGNOSIS — L700 Acne vulgaris: Secondary | ICD-10-CM

## 2024-03-15 DIAGNOSIS — L680 Hirsutism: Secondary | ICD-10-CM

## 2024-03-15 DIAGNOSIS — L708 Other acne: Secondary | ICD-10-CM | POA: Diagnosis not present

## 2024-03-15 MED ORDER — TRETINOIN 0.025 % EX CREA: TOPICAL_CREAM | Freq: Every day | CUTANEOUS | 5 refills | Status: AC

## 2024-03-15 NOTE — Patient Instructions (Addendum)
 VISIT SUMMARY:  Today, we discussed your concerns about skin irritation, uneven skin tone, and facial hair growth. We reviewed your history with Retin-A  and your current skin issues, and we developed a plan to help manage your sensitive skin and improve your skin tone.  YOUR PLAN:  -ACNE AND FACIAL HYPERPIGMENTATION:  Acne and facial hyperpigmentation refer to the presence of pimples and dark spots on the skin. We have prescribed tretinoin  (Retin-A ) to be used twice a week at night to help with these issues.  You should wash your face with a gentle cleanser like Cetaphil or Cerave. In the morning, use Avene Tolerance moisturizer, and at night, use Avene Cicalfate. We also provided samples of Eucerin Radiant Tone serum for morning use.   It's important to apply tretinoin  with moisturizers to minimize irritation. Avoid using heavy lotions like shea butter or Vaseline on your face.  Morning - Wash face face with CeraVe Cream Wash -Apply Eucerin Radiant Tone -Apply Avene Tolerance Moisturizer  Evening -Wash face with CeraVe cream wash -Apply Eucerin Radiant Tone -2 nights a week apply Tretinoin  -Apply Avene Cicalfate Cream  -FACIAL HIRSUTISM:  Facial hirsutism is the growth of unwanted facial hair, often due to hormonal changes. We recommend using an electric facial trimmer for hair removal. In the future, you might consider electrolysis or laser hair removal as more permanent solutions.  INSTRUCTIONS:  Please follow up with us  if you experience any severe irritation or if you have any questions about your skincare regimen. Consider scheduling an appointment with a dermatologist familiar with your skin type for further personalized care.   Electric Facial Trimmer (Purchase on [employer])       Important Information  Due to recent changes in healthcare laws, you may see results of your pathology and/or laboratory studies on MyChart before the doctors have had a chance to review them. We  understand that in some cases there may be results that are confusing or concerning to you. Please understand that not all results are received at the same time and often the doctors may need to interpret multiple results in order to provide you with the best plan of care or course of treatment. Therefore, we ask that you please give us  2 business days to thoroughly review all your results before contacting the office for clarification. Should we see a critical lab result, you will be contacted sooner.   If You Need Anything After Your Visit  If you have any questions or concerns for your doctor, please call our main line at [phone] If no one answers, please leave a voicemail as directed and we will return your call as soon as possible. Messages left after 4 pm will be answered the following business day.   You may also send us  a message via MyChart. We typically respond to MyChart messages within 1-2 business days.  For prescription refills, please ask your pharmacy to contact our office. Our fax number is [phone].  If you have an urgent issue when the clinic is closed that cannot wait until the next business day, you can page your doctor at the number below.    Please note that while we do our best to be available for urgent issues outside of office hours, we are not available 24/7.   If you have an urgent issue and are unable to reach us , you may choose to seek medical care at your doctor's office, retail clinic, urgent care center, or emergency room.  If you have a  medical emergency, please immediately call 911 or go to the emergency department. In the event of inclement weather, please call our main line at [phone] for an update on the status of any delays or closures.  Dermatology Medication Tips: Please keep the boxes that topical medications come in in order to help keep track of the instructions about where and how to use these. Pharmacies typically print the medication  instructions only on the boxes and not directly on the medication tubes.   If your medication is too expensive, please contact our office at [phone] or send us  a message through MyChart.   We are unable to tell what your co-pay for medications will be in advance as this is different depending on your insurance coverage. However, we may be able to find a substitute medication at lower cost or fill out paperwork to get insurance to cover a needed medication.   If a prior authorization is required to get your medication covered by your insurance company, please allow us  1-2 business days to complete this process.  Drug prices often vary depending on where the prescription is filled and some pharmacies may offer cheaper prices.  The website www.goodrx.com contains coupons for medications through different pharmacies. The prices here do not account for what the cost may be with help from insurance (it may be cheaper with your insurance), but the website can give you the price if you did not use any insurance.  - You can print the associated coupon and take it with your prescription to the pharmacy.  - You may also stop by our office during regular business hours and pick up a GoodRx coupon card.  - If you need your prescription sent electronically to a different pharmacy, notify our office through [facility] or by phone at [phone]

## 2024-03-15 NOTE — Progress Notes (Signed)
   New Patient Visit   Subjective  Elizabeth Best is a 68 y.o. female who presents for the following: Her lower face gets dry and bumpy. She gets hairs growing from her chin and the skin looks like it's getting darker around her mouth. It has been going on for years. She had Retin A years ago in [place]  but it was too irritating. She says she also saw a dermatologist in [place]  that gave her a shot but she is not sure what it was for.    The following portions of the chart were reviewed this encounter and updated as appropriate: medications, allergies, medical history  Review of Systems:  No other skin or systemic complaints except as noted in HPI or Assessment and Plan.  Objective  Well appearing patient in no apparent distress; mood and affect are within normal limits.   A focused examination was performed of the following areas: Face  Relevant exam findings are noted in the Assessment and Plan.            Assessment & Plan   Acne and facial hyperpigmentation Chronic acne and facial hyperpigmentation with sensitive skin. Previous Retin-A  use caused irritation and dryness. Current skin issues include bumpy texture and dark areas around the mouth. Sensitive skin requires careful management to avoid irritation.  - Prescribed tretinoin  (Retin-A ) to be used twice a week at night. - Instructed to wash face with gentle cleanser such as Cetaphil or Cerave. - Recommended Avene Tolerance moisturizer in the morning and Avene Sycophate at night. - Provided samples of Eucerin Radiant Tone serum for morning use. - Educated on proper application of tretinoin  with moisturizers to minimize irritation. - Advised against using heavy lotions like shea butter or Vaseline on the face.  Facial hirsutism Likely related to hormonal changes post-menopause. Previous hysterectomy noted. Discussed non-invasive hair removal options.  - Recommended electric facial trimmer for hair removal. -  Discussed potential for electrolysis or laser hair removal as future options.   No follow-ups on file.  I, Roseline Hutchinson, CMA, am acting as scribe for [employer], DO .   Documentation: I have reviewed the above documentation for accuracy and completeness, and I agree with the above.  Delon Lenis, DO

## 2024-03-18 NOTE — Assessment & Plan Note (Signed)
 On Wellbutrin  with no side effects.  Needs refill today.  Continue current dosage

## 2024-03-18 NOTE — Assessment & Plan Note (Signed)
 Given vascular disease of aorta patient is on Wegovy  for decreasing further risk.  Increase Wegovy  to 1.7 mg weekly.

## 2024-03-22 ENCOUNTER — Other Ambulatory Visit

## 2024-03-22 ENCOUNTER — Ambulatory Visit (INDEPENDENT_AMBULATORY_CARE_PROVIDER_SITE_OTHER): Payer: Self-pay | Admitting: Physician Assistant

## 2024-03-31 ENCOUNTER — Other Ambulatory Visit (INDEPENDENT_AMBULATORY_CARE_PROVIDER_SITE_OTHER): Payer: Self-pay | Admitting: Physician Assistant

## 2024-03-31 DIAGNOSIS — R7303 Prediabetes: Secondary | ICD-10-CM

## 2024-04-14 ENCOUNTER — Telehealth: Payer: Self-pay

## 2024-04-14 NOTE — Progress Notes (Signed)
" ° °  04/14/2024  Patient ID: Elizabeth Best, female   DOB: 28-Jan-1956, 68 y.o.   MRN: [id number]  This patient is appearing on a report for being at risk of failing the adherence measure for diabetes medications this calendar year.   Medication: metformin  500mg  Last fill date: 03/30/24 for 30 day supply  Insurance report was not up to date. No action needed at this time.   Channing DELENA Mealing, PharmD, DPLA   "

## 2024-04-19 ENCOUNTER — Ambulatory Visit (INDEPENDENT_AMBULATORY_CARE_PROVIDER_SITE_OTHER): Admitting: Physician Assistant

## 2024-04-19 ENCOUNTER — Encounter (INDEPENDENT_AMBULATORY_CARE_PROVIDER_SITE_OTHER): Payer: Self-pay | Admitting: Physician Assistant

## 2024-04-19 VITALS — BP 133/82 | HR 77 | Temp 97.9°F | Ht 63.0 in | Wt 162.0 lb

## 2024-04-19 DIAGNOSIS — E782 Mixed hyperlipidemia: Secondary | ICD-10-CM | POA: Diagnosis not present

## 2024-04-19 DIAGNOSIS — F3289 Other specified depressive episodes: Secondary | ICD-10-CM

## 2024-04-19 DIAGNOSIS — R7303 Prediabetes: Secondary | ICD-10-CM

## 2024-04-19 DIAGNOSIS — F17209 Nicotine dependence, unspecified, with unspecified nicotine-induced disorders: Secondary | ICD-10-CM

## 2024-04-19 DIAGNOSIS — Z6828 Body mass index (BMI) 28.0-28.9, adult: Secondary | ICD-10-CM | POA: Diagnosis not present

## 2024-04-19 DIAGNOSIS — E669 Obesity, unspecified: Secondary | ICD-10-CM

## 2024-04-19 DIAGNOSIS — I700 Atherosclerosis of aorta: Secondary | ICD-10-CM | POA: Diagnosis not present

## 2024-04-19 DIAGNOSIS — E559 Vitamin D deficiency, unspecified: Secondary | ICD-10-CM | POA: Diagnosis not present

## 2024-04-19 DIAGNOSIS — I10 Essential (primary) hypertension: Secondary | ICD-10-CM

## 2024-04-19 MED ORDER — BUPROPION HCL ER (SR) 150 MG PO TB12: 150.0000 mg | ORAL_TABLET | Freq: Every day | ORAL | 0 refills | Status: DC

## 2024-04-19 MED ORDER — METFORMIN HCL 500 MG PO TABS: 500.0000 mg | ORAL_TABLET | Freq: Every day | ORAL | 0 refills | Status: DC

## 2024-04-19 MED ORDER — WEGOVY 2.4 MG/0.75ML ~~LOC~~ SOAJ: 2.4000 mg | SUBCUTANEOUS | 0 refills | Status: DC

## 2024-04-19 MED ORDER — VITAMIN D (ERGOCALCIFEROL) 1.25 MG (50000 UNIT) PO CAPS: 50000.0000 [IU] | ORAL_CAPSULE | ORAL | 0 refills | Status: DC

## 2024-04-19 NOTE — Progress Notes (Signed)
 "  SUBJECTIVE: Discussed the use of AI scribe software for clinical note transcription with the patient, who gave verbal consent to proceed.  Chief Complaint: Obesity  Interim History: She is down 1 lb since her last visit.  Down 27 lbs overall TBW loss of 14.3%  Coda is here to discuss her progress with her obesity treatment plan. She is on the Category 2 Plan and states she is following her eating plan approximately 80 % of the time. She states she is not exercising but has high NEAT at work.   Cristalle Rohm is a 68 year old female with obesity who presents for follow-up of her obesity treatment plan.  She is currently on Wegovy  2.4 mg once weekly, which was recently increased, with no significant issues such as nausea. She has lost 27 pounds and feels lighter, with improved breathing. She is concerned about losing Medicaid coverage and the cost of Wegovy , as her insurance has switched to [facility] only.  Her medical history includes mixed hyperlipidemia, aortic atherosclerosis, prediabetes, hypertension, emotional eating behavior, vitamin D  deficiency, and tobacco use disorder. Her current medications include metformin  500 mg once daily for prediabetes, bupropion  SR 150 mg daily for emotional eating behaviors, Lipitor 20 mg once daily for hyperlipidemia, and ergocalciferol  50,000 units once weekly for vitamin D  deficiency.  Her recent lab results show her LDL was previously 149 and is now 112. Her A1c was 5.7 in 2023 and is now 5.0. Her insulin  level was previously 17 and is now 12.4. Her vitamin D  level is now 53, and her vitamin B12 is 981. Her calcium  level is higher than normal, and she has had at least seven iron  infusions in the past.  She feels exhausted due to work commitments and acknowledges overeating somewhat around the holidays,  but is pleased with her weight loss progress. She is more mindful of her eating habits and is trying to focus on protein intake and decreasing sugar  intake. No nausea with the increased Wegovy  dose. She feels lighter and is breathing easier. No problems with metformin , which she takes once a day.   OBJECTIVE: Visit Diagnoses: Problem List Items Addressed This Visit     Essential hypertension   Vitamin D  deficiency   Relevant Medications   Vitamin D , Ergocalciferol , (DRISDOL ) 1.25 MG (50000 UNIT) CAPS capsule   Tobacco use disorder, continuous   Depression   Relevant Medications   buPROPion  (WELLBUTRIN  SR) 150 MG 12 hr tablet   Aortic atherosclerosis   Relevant Medications   semaglutide -weight management (WEGOVY ) 2.4 MG/0.75ML SOAJ SQ injection   Other Visit Diagnoses       Mixed hyperlipidemia    -  Primary   Relevant Medications   semaglutide -weight management (WEGOVY ) 2.4 MG/0.75ML SOAJ SQ injection     Prediabetes       Relevant Medications   semaglutide -weight management (WEGOVY ) 2.4 MG/0.75ML SOAJ SQ injection   metFORMIN  (GLUCOPHAGE ) 500 MG tablet     Generalized obesity- start BMI 33.58       Relevant Medications   semaglutide -weight management (WEGOVY ) 2.4 MG/0.75ML SOAJ SQ injection   metFORMIN  (GLUCOPHAGE ) 500 MG tablet     BMI 28.0-28.9,adult Current BMI 28.7         Obesity Management with Wegovy  2.4 mg weekly for primary indication of hyperlipidemia with aortic atherosclerosis has resulted in a 27-pound weight loss.  Concerns about insurance coverage for Wegovy  due to loss of Medicaid. Potential future availability of a pill form of Wegovy , pending insurance coverage  and cost. Discussed alternative medications like Topamax for appetite control if Wegovy  is discontinued. Emphasized the importance of maintaining a healthy weight loss plan to prevent frailty and improve overall health. - Continue Wegovy  2.4 mg weekly if insurance allows for hyperlipidemia with aortic atherosclerosis. - Consider alternative medications like Topamax for cravings if Wegovy  is discontinued. - Monitor weight and appetite, especially  if Wegovy  dose is reduced. - Scheduled follow-up appointment on February 2nd at 12:00 PM.  Mixed hyperlipidemia with aortic atherosclerosis Cholesterol levels have improved with LDL reduced from 149 to 112. Continued management with Wegovy  2.4 mg weekly and  Lipitor 20 mg daily. Emphasized the importance of maintaining cholesterol control to prevent aortic atherosclerosis progression. Denies mass in neck, dysphagia, dyspepsia, persistent hoarseness, abdominal pain, or N/V/Constipation or diarrhea. Has annual eye exam. Mood is stable.  Reports no SE with Lipitor Lab Results  Component Value Date   CHOL 173 02/23/2024   CHOL 229 (H) 08/05/2022   CHOL 139 01/17/2022   Lab Results  Component Value Date   HDL 45 02/23/2024   HDL 33.90 (L) 08/05/2022   HDL 36.00 (L) 01/17/2022   Lab Results  Component Value Date   LDLCALC 112 (H) 02/23/2024   LDLCALC 157 (H) 08/05/2022   LDLCALC 87 01/17/2022   Lab Results  Component Value Date   TRIG 85 02/23/2024   TRIG 189.0 (H) 08/05/2022   TRIG 83.0 01/17/2022   Lab Results  Component Value Date   CHOLHDL 7 08/05/2022   CHOLHDL 4 01/17/2022   CHOLHDL 4 05/21/2021   No results found for: LDLDIRECT LDL improving, HDL also improved, Triglycerides improved.  Continue to work on nutrition plan -decreasing simple carbohydrates, increasing lean proteins, decreasing saturated fats and cholesterol , avoiding trans fats and exercise as able to promote weight loss, improve lipids and decrease cardiovascular risks. - Continue Lipitor 20 mg daily. Continue/refill Wegovy  2.4 mg weekly  Meds ordered this encounter  Medications   semaglutide -weight management (WEGOVY ) 2.4 MG/0.75ML SOAJ SQ injection    Sig: Inject 2.4 mg into the skin once a week.    Dispense:  9 mL    Refill:  0   buPROPion  (WELLBUTRIN  SR) 150 MG 12 hr tablet    Sig: Take 1 tablet (150 mg total) by mouth daily.    Dispense:  30 tablet    Refill:  0   metFORMIN  (GLUCOPHAGE ) 500  MG tablet    Sig: Take 1 tablet (500 mg total) by mouth daily.    Dispense:  30 tablet    Refill:  0   Vitamin D , Ergocalciferol , (DRISDOL ) 1.25 MG (50000 UNIT) CAPS capsule    Sig: Take 1 capsule (50,000 Units total) by mouth every 14 (fourteen) days.    Dispense:  6 capsule    Refill:  0    Prediabetes Significant improvement in glycemic control with A1c reduced from 5.8 to 5.0, now below the prediabetic range. Improvement attributed to weight loss, metformin , and Wegovy . Insulin  levels have also improved. Lab Results  Component Value Date   HGBA1C 5.0 02/23/2024   HGBA1C 5.5 06/25/2023   HGBA1C 5.4 01/03/2023   Lab Results  Component Value Date   LDLCALC 112 (H) 02/23/2024   CREATININE 0.73 02/23/2024    Lab Results  Component Value Date   INSULIN  12.4 02/23/2024   INSULIN  17.4 06/25/2023   Lab Results  Component Value Date   GLUCOSE 74 02/23/2024   GLUCOSE 98 05/18/2012   We reviewed treatment options which  include losing 7 to 10% of body weight, increasing physical activity to a 150 minutes a week of moderate intensity. A1c and insulin  levels have both improved with metformin  and Wegovy .  Plan: Continue working on nutrition plan to decrease simple carbohydrates, increase lean proteins and exercise to promote weight loss, improve glycemic control and prevent progression to Type 2 diabetes.  - Continue metformin  500 mg daily. - Continue Wegovy  2.4 mg weekly  Vitamin D  deficiency Vitamin D  levels have improved to 53 from 24. Current regimen of ergocalciferol  50,000 units weekly is effective. Discussed potential cost savings by switching to every other week dosing. Last vitamin D  Lab Results  Component Value Date   VD25OH 53.2 02/23/2024   Low vitamin D  levels can be associated with adiposity and may result in leptin resistance and weight gain. Also associated with fatigue.  Currently on vitamin D  supplementation without any adverse effects such as nausea, vomiting  or muscle weakness.  - Changed/refill ergocalciferol  dosing to 50,000 units every other week.  Vitals Temp: 97.9 F (36.6 C) BP: 133/82 Pulse Rate: 77 SpO2: 98 %   Anthropometric Measurements Height: 5' 3 (1.6 m) Weight: 162 lb (73.5 kg) BMI (Calculated): 28.7 Weight at Last Visit: 167lb Weight Lost Since Last Visit: 5lb Weight Gained Since Last Visit: 0lb Starting Weight: 189lb Total Weight Loss (lbs): 27 lb (12.2 kg)   Body Composition  Body Fat %: 37.1 % Fat Mass (lbs): 60.2 lbs Muscle Mass (lbs): 96.6 lbs Total Body Water (lbs): 65.6 lbs Visceral Fat Rating : 10   Other Clinical Data Fasting: No Labs: No Today's Visit #: 11 Starting Date: 06/25/23     ASSESSMENT AND PLAN:  Diet: Marlinda is currently in the action stage of change. As such, her goal is to continue with weight loss efforts. She has agreed to Category 2 Plan.  Exercise: Lachina has been instructed to work up to a goal of 150 minutes of combined cardio and strengthening exercise per week for weight loss and overall health benefits.   Behavior Modification:  We discussed the following Behavioral Modification Strategies today: increasing lean protein intake, decreasing simple carbohydrates, increasing vegetables, increase H2O intake, increase high fiber foods, meal planning and cooking strategies, emotional eating strategies , holiday eating strategies, avoiding temptations, and planning for success. We discussed various medication options to help Mailen with her weight loss efforts and we both agreed to continue Wegovy  2.4 mg weekly for primary indication of hyperlipidemia/aortic atherosclerosis and metformin  for prediabetes.  Return in about 4 weeks (around 05/17/2024).[name] She was informed of the importance of frequent follow up visits to maximize her success with intensive lifestyle modifications for her multiple health conditions.  Attestation Statements:   Reviewed by clinician on day of  visit: allergies, medications, problem list, medical history, surgical history, family history, social history, and previous encounter notes.   Time spent on visit including pre-visit chart review and post-visit care and charting was 50 minutes.    Maddy Graham, PA-C  "

## 2024-04-30 ENCOUNTER — Other Ambulatory Visit

## 2024-05-18 ENCOUNTER — Other Ambulatory Visit (INDEPENDENT_AMBULATORY_CARE_PROVIDER_SITE_OTHER): Payer: Self-pay | Admitting: Physician Assistant

## 2024-05-18 DIAGNOSIS — F3289 Other specified depressive episodes: Secondary | ICD-10-CM

## 2024-05-23 ENCOUNTER — Encounter (INDEPENDENT_AMBULATORY_CARE_PROVIDER_SITE_OTHER): Payer: Self-pay | Admitting: *Deleted

## 2024-05-23 NOTE — Progress Notes (Unsigned)
 TeleHealth Visit:  This visit was completed with telemedicine (audio/video) technology. Dylan has verbally consented to this TeleHealth visit. The patient is located at home, the provider is located at home. The participants in this visit include the listed provider and patient. The visit was conducted today via MyChart video.   SUBJECTIVE: Discussed the use of AI scribe software for clinical note transcription with the patient, who gave verbal consent to proceed.  Chief Complaint: Obesity  Interim History: ***  Latresha is here to discuss her progress with her obesity treatment plan. She is on the {HWW Weight Loss Plan:210964005} and states she {CHL AMB IS/IS NOT:210130109} following her eating plan approximately *** % of the time. She states she {CHL AMB IS/IS NOT:210130109} exercising *** minutes *** times per week.  Current Outpatient Medications  Medication Instructions   atorvastatin  (LIPITOR) 20 mg, Oral, Every evening   buPROPion  (WELLBUTRIN  SR) 150 mg, Oral, Daily   ibuprofen  (ADVIL ) 800 mg, Oral, Every 8 hours PRN   metFORMIN  (GLUCOPHAGE ) 500 mg, Oral, Daily   tretinoin  (RETIN-A ) 0.025 % cream Topical, Daily at bedtime, Apply pea size amount to face 2 nights per week   varenicline  (CHANTIX ) 1 mg, Oral, 2 times daily   Vitamin D  (Ergocalciferol ) (DRISDOL ) 50,000 Units, Oral, Every 14 days   Wegovy  2.4 mg, Subcutaneous, Weekly    OBJECTIVE: Visit Diagnoses: Problem List Items Addressed This Visit     Essential hypertension   Vitamin D  deficiency   Depression   Aortic atherosclerosis   Other Visit Diagnoses       Prediabetes    -  Primary     Mixed hyperlipidemia         Generalized obesity- start BMI 33.58           No data recorded No data recorded No data recorded No data recorded   ASSESSMENT AND PLAN:  Diet: Jocie {CHL AMB IS/IS NOT:210130109} currently in the action stage of change. As such, her goal is to {HWW Weight Loss Efforts:210964006}.  She {HAS HAS WNU:81165} agreed to {HWW Weight Loss Plan:210964005}.  Exercise: Annisten has been instructed {HWW Exercise:210964007} for weight loss and overall health benefits.   Behavior Modification:  We discussed the following Behavioral Modification Strategies today: {HWW Behavior Modification:210964008}. We discussed various medication options to help Zahra with her weight loss efforts and we both agreed to ***.  No follow-ups on file.[name] She was informed of the importance of frequent follow up visits to maximize her success with intensive lifestyle modifications for her multiple health conditions.  Attestation Statements:   Reviewed by clinician on day of visit: allergies, medications, problem list, medical history, surgical history, family history, social history, and previous encounter notes.   Time spent on visit including pre-visit chart review and post-visit care and charting was *** minutes.    Detrice Cales, PA-C

## 2024-05-24 ENCOUNTER — Encounter (INDEPENDENT_AMBULATORY_CARE_PROVIDER_SITE_OTHER): Payer: Self-pay | Admitting: Physician Assistant

## 2024-05-24 ENCOUNTER — Telehealth (INDEPENDENT_AMBULATORY_CARE_PROVIDER_SITE_OTHER): Admitting: Physician Assistant

## 2024-05-24 VITALS — Ht 63.0 in | Wt 162.0 lb

## 2024-05-24 DIAGNOSIS — Z6828 Body mass index (BMI) 28.0-28.9, adult: Secondary | ICD-10-CM | POA: Diagnosis not present

## 2024-05-24 DIAGNOSIS — R7303 Prediabetes: Secondary | ICD-10-CM | POA: Diagnosis not present

## 2024-05-24 DIAGNOSIS — E559 Vitamin D deficiency, unspecified: Secondary | ICD-10-CM

## 2024-05-24 DIAGNOSIS — E669 Obesity, unspecified: Secondary | ICD-10-CM

## 2024-05-24 DIAGNOSIS — F3289 Other specified depressive episodes: Secondary | ICD-10-CM

## 2024-05-24 DIAGNOSIS — E782 Mixed hyperlipidemia: Secondary | ICD-10-CM | POA: Diagnosis not present

## 2024-05-24 DIAGNOSIS — I700 Atherosclerosis of aorta: Secondary | ICD-10-CM | POA: Diagnosis not present

## 2024-05-24 DIAGNOSIS — F5089 Other specified eating disorder: Secondary | ICD-10-CM

## 2024-05-24 DIAGNOSIS — I10 Essential (primary) hypertension: Secondary | ICD-10-CM

## 2024-05-24 MED ORDER — BUPROPION HCL ER (SR) 150 MG PO TB12: 150.0000 mg | ORAL_TABLET | Freq: Every day | ORAL | 0 refills | Status: AC

## 2024-05-24 MED ORDER — WEGOVY 2.4 MG/0.75ML ~~LOC~~ SOAJ: 2.4000 mg | SUBCUTANEOUS | 0 refills | Status: AC

## 2024-05-24 MED ORDER — METFORMIN HCL 500 MG PO TABS: 500.0000 mg | ORAL_TABLET | Freq: Every day | ORAL | 0 refills | Status: AC

## 2024-05-24 MED ORDER — VITAMIN D (ERGOCALCIFEROL) 1.25 MG (50000 UNIT) PO CAPS: 50000.0000 [IU] | ORAL_CAPSULE | ORAL | 0 refills | Status: AC

## 2024-05-26 ENCOUNTER — Telehealth: Payer: Self-pay

## 2024-05-26 NOTE — Telephone Encounter (Signed)
 PA submitted through Cover My Meds for Wegovy  2.4 mg. Awaiting insurance determination. Key: [id number]

## 2024-05-27 NOTE — Telephone Encounter (Signed)
 The medication is denied for 90 days supply. The approval is only when written for 30 days supply.  The drug above is APPROVED for/through 04/21/2025 for up to a 30-day supply by [employer]'s Non-Formulary Exceptions Coverage Policy. You asked for the drug above for a 84 day supply. Page 6 of your Prescription Drug Guide (PDG) says that some drugs are limited to a 30-day supply. Due to this drugs cost and/or tier, your plan only allows up to a 30-day supply of this drug per fill. The 30-day supply limit on certain drugs can also apply to mail-order. You can view your PDG and see a full list of in-network pharmacies by visiting us  online or call the number on the back of your card.    Patient notified via Mychart message.

## 2024-06-21 ENCOUNTER — Ambulatory Visit (INDEPENDENT_AMBULATORY_CARE_PROVIDER_SITE_OTHER): Admitting: Physician Assistant

## 2024-07-21 ENCOUNTER — Encounter

## 2024-07-21 ENCOUNTER — Ambulatory Visit

## 2024-09-06 ENCOUNTER — Ambulatory Visit: Admitting: Nurse Practitioner

## 2024-09-15 ENCOUNTER — Ambulatory Visit: Admitting: Hematology

## 2024-09-15 ENCOUNTER — Other Ambulatory Visit

## 2024-09-20 ENCOUNTER — Ambulatory Visit: Admitting: Dermatology
